# Patient Record
Sex: Female | Born: 2000 | Race: Black or African American | Hispanic: No | Marital: Single | Smoking: Never smoker
Health system: Southern US, Community
[De-identification: ages and names within clinical notes are randomized; demographics above are authoritative.]

## PROBLEM LIST (undated history)

## (undated) ENCOUNTER — Inpatient Hospital Stay (HOSPITAL_COMMUNITY): Payer: Self-pay

## (undated) DIAGNOSIS — M069 Rheumatoid arthritis, unspecified: Secondary | ICD-10-CM

## (undated) DIAGNOSIS — S39012A Strain of muscle, fascia and tendon of lower back, initial encounter: Secondary | ICD-10-CM

## (undated) HISTORY — PX: NO PAST SURGERIES: SHX2092

---
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
## 2020-01-04 DIAGNOSIS — R768 Other specified abnormal immunological findings in serum: Secondary | ICD-10-CM | POA: Diagnosis not present

## 2020-01-04 DIAGNOSIS — Z8739 Personal history of other diseases of the musculoskeletal system and connective tissue: Secondary | ICD-10-CM | POA: Diagnosis not present

## 2020-01-04 DIAGNOSIS — M059 Rheumatoid arthritis with rheumatoid factor, unspecified: Secondary | ICD-10-CM | POA: Diagnosis not present

## 2020-01-04 DIAGNOSIS — Z79899 Other long term (current) drug therapy: Secondary | ICD-10-CM | POA: Diagnosis not present

## 2020-02-07 ENCOUNTER — Encounter (HOSPITAL_COMMUNITY): Payer: Self-pay

## 2020-02-07 ENCOUNTER — Emergency Department (HOSPITAL_COMMUNITY)
Admission: EM | Admit: 2020-02-07 | Discharge: 2020-02-07 | Disposition: A | Discharge status: Home / Self Care | Payer: BC Managed Care – PPO | Attending: Emergency Medicine | Admitting: Emergency Medicine

## 2020-02-07 ENCOUNTER — Other Ambulatory Visit: Payer: Self-pay

## 2020-02-07 DIAGNOSIS — M549 Dorsalgia, unspecified: Secondary | ICD-10-CM | POA: Insufficient documentation

## 2020-02-07 DIAGNOSIS — N939 Abnormal uterine and vaginal bleeding, unspecified: Secondary | ICD-10-CM | POA: Insufficient documentation

## 2020-02-07 DIAGNOSIS — N399 Disorder of urinary system, unspecified: Secondary | ICD-10-CM | POA: Diagnosis not present

## 2020-02-07 DIAGNOSIS — N938 Other specified abnormal uterine and vaginal bleeding: Secondary | ICD-10-CM

## 2020-02-07 HISTORY — DX: Rheumatoid arthritis, unspecified: M06.9

## 2020-02-07 LAB — URINALYSIS, ROUTINE W REFLEX MICROSCOPIC
Bilirubin Urine: NEGATIVE
Glucose, UA: NEGATIVE mg/dL
Ketones, ur: NEGATIVE mg/dL
Leukocytes,Ua: NEGATIVE
Nitrite: NEGATIVE
Protein, ur: NEGATIVE mg/dL
Specific Gravity, Urine: 1.024 (ref 1.005–1.030)
pH: 5 (ref 5.0–8.0)

## 2020-02-07 LAB — CBC
HCT: 35.6 % — ABNORMAL LOW (ref 36.0–46.0)
Hemoglobin: 11.2 g/dL — ABNORMAL LOW (ref 12.0–15.0)
MCH: 27.4 pg (ref 26.0–34.0)
MCHC: 31.5 g/dL (ref 30.0–36.0)
MCV: 87 fL (ref 80.0–100.0)
Platelets: 334 K/uL (ref 150–400)
RBC: 4.09 MIL/uL (ref 3.87–5.11)
RDW: 14.2 % (ref 11.5–15.5)
WBC: 4.6 K/uL (ref 4.0–10.5)
nRBC: 0 % (ref 0.0–0.2)

## 2020-02-07 LAB — PREGNANCY, URINE: Preg Test, Ur: NEGATIVE

## 2020-02-07 NOTE — ED Provider Notes (Signed)
[facility] EMERGENCY DEPARTMENT Provider Note   CSN: [id number] Arrival date & time: 02/07/20  2725     History Chief Complaint  Patient presents with  . Vaginal Bleeding    Caitlyn Green is a 19 y.o. female with a history of rheumatoid arthritis only, presenting for evaluation of vaginal bleeding.  Her LMP was 01/22/2020 and normal, stating she typically has normal cycles.  She started bleeding heavy 4 days ago and reports low back cramping discomfort as well.  She has had unprotected sex and was concerned about possible miscarriage, although she has taken 2 home pregnancy tests which were negative.  She denies significant abdominal cramping, no nausea or vomiting, no weakness.  She states she is having to change her pad twice per hour during the day, but does not have to change overnight.  She denies vaginal discharge, dysuria, dizziness.  HPI     Past Medical History:  Diagnosis Date  . Back strain   . Rheumatoid arthritis (HCC)     There are no problems to display for this patient.   History reviewed. No pertinent surgical history.   OB History    Gravida  0   Para  0   Term  0   Preterm  0   AB  0   Living  0     SAB  0   TAB  0   Ectopic  0   Multiple  0   Live Births  0           Family History  Problem Relation Age of Onset  . Healthy Mother   . Cancer Father     Social History   Tobacco Use  . Smoking status: Never Smoker  . Smokeless tobacco: Never Used  Vaping Use  . Vaping Use: Never used  Substance Use Topics  . Alcohol use: No  . Drug use: No    Home Medications Prior to Admission medications   Not on File    Allergies    Patient has no known allergies.  Review of Systems   Review of Systems  Constitutional: Negative for fever.  HENT: Negative.   Eyes: Negative.   Respiratory: Negative for chest tightness and shortness of breath.   Cardiovascular: Negative for chest pain.  Gastrointestinal: Negative for  abdominal pain, nausea and vomiting.  Genitourinary: Positive for vaginal bleeding. Negative for vaginal discharge.  Musculoskeletal: Positive for back pain. Negative for arthralgias, joint swelling and neck pain.  Skin: Negative.  Negative for rash and wound.  Neurological: Negative for dizziness, weakness, light-headedness, numbness and headaches.  Psychiatric/Behavioral: Negative.     Physical Exam Updated Vital Signs BP (!) 102/58   Pulse 77   Temp 98.2 F (36.8 C) (Oral)   Resp 18   Ht 5\' 11"  (1.803 m)   Wt 62.1 kg   LMP 01/22/2020   SpO2 100%   BMI 19.11 kg/m   Physical Exam Vitals and nursing note reviewed.  Constitutional:      Appearance: She is well-developed.  HENT:     Head: Normocephalic and atraumatic.  Eyes:     Conjunctiva/sclera: Conjunctivae normal.  Cardiovascular:     Rate and Rhythm: Normal rate and regular rhythm.     Heart sounds: Normal heart sounds.  Pulmonary:     Effort: Pulmonary effort is normal.     Breath sounds: Normal breath sounds. No wheezing.  Abdominal:     General: Bowel sounds are normal.  Palpations: Abdomen is soft. There is no mass.     Tenderness: There is no abdominal tenderness. There is no guarding.  Genitourinary:    Comments: deferred Musculoskeletal:        General: Normal range of motion.     Cervical back: Normal range of motion.  Skin:    General: Skin is warm and dry.  Neurological:     General: No focal deficit present.     Mental Status: She is alert.     ED Results / Procedures / Treatments   Labs (all labs ordered are listed, but only abnormal results are displayed) Labs Reviewed  URINALYSIS, ROUTINE W REFLEX MICROSCOPIC - Abnormal; Notable for the following components:      Result Value   Color, Urine AMBER (*)    APPearance HAZY (*)    Hgb urine dipstick MODERATE (*)    Bacteria, UA RARE (*)    All other components within normal limits  CBC - Abnormal; Notable for the following components:    Hemoglobin 11.2 (*)    HCT 35.6 (*)    All other components within normal limits  PREGNANCY, URINE    EKG None  Radiology No results found.  Procedures Procedures (including critical care time)  Medications Ordered in ED Medications - No data to display  ED Course  I have reviewed the triage vital signs and the nursing notes.  Pertinent labs & imaging results that were available during my care of the patient were reviewed by me and considered in my medical decision making (see chart for details).    MDM Rules/Calculators/A&P                          Patient with new onset abnormal vaginal bleeding, stable vital signs and blood work today, she is in no distress, denies weakness, lightheadedness.She is borderline anemic, no prior recent hemoglobins to compare this to.  She was given strict instructions for follow-up care, return precautions for lightheadedness, weakness or bleeding that persist beyond the next 2 to 3 days., also referred to gynecology as she expressed an interest in establishing care with a dedicated gynecologist.  Final Clinical Impression(s) / ED Diagnoses Final diagnoses:  Dysfunctional uterine bleeding    Rx / DC Orders ED Discharge Orders    None       Victoriano Lain 02/07/20 1321    Mancel Bale, MD 02/08/20 [id number]

## 2020-02-07 NOTE — Discharge Instructions (Signed)
Your lab tests and exam are reassuring today.  However I do recommend a follow-up with your primary doctor for recheck if your bleeding persists beyond the next 2 to 3 days or if you develop dizziness or weakness.  Make sure you are drinking plenty of fluids.  You may also want to consider seeing a gynecologist, especially if you have continued problems with irregular and heavy periods.  I have given your referral to [facility] here in [place] if you choose that route.

## 2020-02-07 NOTE — ED Triage Notes (Signed)
Pt reports she thinks she is having a miscarriage. Pt does not have a Ob.. reports vaginal bleeding since Friday. Changing pads 2 x per hour. No cramps now. Pt has had 2 negative test

## 2020-03-13 ENCOUNTER — Encounter: Payer: Self-pay | Admitting: Adult Health

## 2020-03-13 ENCOUNTER — Ambulatory Visit (INDEPENDENT_AMBULATORY_CARE_PROVIDER_SITE_OTHER): Payer: BC Managed Care – PPO | Admitting: Adult Health

## 2020-03-13 ENCOUNTER — Other Ambulatory Visit: Payer: Self-pay

## 2020-03-13 VITALS — BP 113/77 | HR 97 | Ht 71.0 in | Wt 131.2 lb

## 2020-03-13 DIAGNOSIS — O3680X0 Pregnancy with inconclusive fetal viability, not applicable or unspecified: Secondary | ICD-10-CM | POA: Diagnosis not present

## 2020-03-13 DIAGNOSIS — N926 Irregular menstruation, unspecified: Secondary | ICD-10-CM | POA: Diagnosis not present

## 2020-03-13 DIAGNOSIS — Z3201 Encounter for pregnancy test, result positive: Secondary | ICD-10-CM | POA: Insufficient documentation

## 2020-03-13 DIAGNOSIS — Z3A01 Less than 8 weeks gestation of pregnancy: Secondary | ICD-10-CM | POA: Insufficient documentation

## 2020-03-13 LAB — POCT URINE PREGNANCY: Preg Test, Ur: POSITIVE — AB

## 2020-03-13 MED ORDER — PRENATAL PLUS IRON 29-1 MG PO TABS: ORAL_TABLET | ORAL | 12 refills | Status: DC

## 2020-03-13 NOTE — Patient Instructions (Signed)
First Trimester of Pregnancy The first trimester of pregnancy is from week 1 until the end of week 13 (months 1 through 3). A week after a sperm fertilizes an egg, the egg will implant on the wall of the uterus. This embryo will begin to develop into a baby. Genes from you and your partner will form the baby. The female genes will determine whether the baby will be a boy or a girl. At 6-8 weeks, the eyes and face will be formed, and the heartbeat can be seen on ultrasound. At the end of 12 weeks, all the baby's organs will be formed. Now that you are pregnant, you will want to do everything you can to have a healthy baby. Two of the most important things are to get good prenatal care and to follow your health care provider's instructions. Prenatal care is all the medical care you receive before the baby's birth. This care will help prevent, find, and treat any problems during the pregnancy and childbirth. Body changes during your first trimester Your body goes through many changes during pregnancy. The changes vary from woman to woman.  You may gain or lose a couple of pounds at first.  You may feel sick to your stomach (nauseous) and you may throw up (vomit). If the vomiting is uncontrollable, call your health care provider.  You may tire easily.  You may develop headaches that can be relieved by medicines. All medicines should be approved by your health care provider.  You may urinate more often. Painful urination may mean you have a bladder infection.  You may develop heartburn as a result of your pregnancy.  You may develop constipation because certain hormones are causing the muscles that push stool through your intestines to slow down.  You may develop hemorrhoids or swollen veins (varicose veins).  Your breasts may begin to grow larger and become tender. Your nipples may stick out more, and the tissue that surrounds them (areola) may become darker.  Your gums may bleed and may be  sensitive to brushing and flossing.  Dark spots or blotches (chloasma, mask of pregnancy) may develop on your face. This will likely fade after the baby is born.  Your menstrual periods will stop.  You may have a loss of appetite.  You may develop cravings for certain kinds of food.  You may have changes in your emotions from day to day, such as being excited to be pregnant or being concerned that something may go wrong with the pregnancy and baby.  You may have more vivid and strange dreams.  You may have changes in your hair. These can include thickening of your hair, rapid growth, and changes in texture. Some women also have hair loss during or after pregnancy, or hair that feels dry or thin. Your hair will most likely return to normal after your baby is born. What to expect at prenatal visits During a routine prenatal visit:  You will be weighed to make sure you and the baby are growing normally.  Your blood pressure will be taken.  Your abdomen will be measured to track your baby's growth.  The fetal heartbeat will be listened to between weeks 10 and 14 of your pregnancy.  Test results from any previous visits will be discussed. Your health care provider may ask you:  How you are feeling.  If you are feeling the baby move.  If you have had any abnormal symptoms, such as leaking fluid, bleeding, severe headaches, or abdominal   cramping.  If you are using any tobacco products, including cigarettes, chewing tobacco, and electronic cigarettes.  If you have any questions. Other tests that may be performed during your first trimester include:  Blood tests to find your blood type and to check for the presence of any previous infections. The tests will also be used to check for low iron levels (anemia) and protein on red blood cells (Rh antibodies). Depending on your risk factors, or if you previously had diabetes during pregnancy, you may have tests to check for high blood sugar  that affects pregnant women (gestational diabetes).  Urine tests to check for infections, diabetes, or protein in the urine.  An ultrasound to confirm the proper growth and development of the baby.  Fetal screens for spinal cord problems (spina bifida) and Down syndrome.  HIV (human immunodeficiency virus) testing. Routine prenatal testing includes screening for HIV, unless you choose not to have this test.  You may need other tests to make sure you and the baby are doing well. Follow these instructions at home: Medicines  Follow your health care provider's instructions regarding medicine use. Specific medicines may be either safe or unsafe to take during pregnancy.  Take a prenatal vitamin that contains at least 600 micrograms (mcg) of folic acid.  If you develop constipation, try taking a stool softener if your health care provider approves. Eating and drinking   Eat a balanced diet that includes fresh fruits and vegetables, whole grains, good sources of protein such as meat, eggs, or tofu, and low-fat dairy. Your health care provider will help you determine the amount of weight gain that is right for you.  Avoid raw meat and uncooked cheese. These carry germs that can cause birth defects in the baby.  Eating four or five small meals rather than three large meals a day may help relieve nausea and vomiting. If you start to feel nauseous, eating a few soda crackers can be helpful. Drinking liquids between meals, instead of during meals, also seems to help ease nausea and vomiting.  Limit foods that are high in fat and processed sugars, such as fried and sweet foods.  To prevent constipation: ? Eat foods that are high in fiber, such as fresh fruits and vegetables, whole grains, and beans. ? Drink enough fluid to keep your urine clear or pale yellow. Activity  Exercise only as directed by your health care provider. Most women can continue their usual exercise routine during  pregnancy. Try to exercise for 30 minutes at least 5 days a week. Exercising will help you: ? Control your weight. ? Stay in shape. ? Be prepared for labor and delivery.  Experiencing pain or cramping in the lower abdomen or lower back is a good sign that you should stop exercising. Check with your health care provider before continuing with normal exercises.  Try to avoid standing for long periods of time. Move your legs often if you must stand in one place for a long time.  Avoid heavy lifting.  Wear low-heeled shoes and practice good posture.  You may continue to have sex unless your health care provider tells you not to. Relieving pain and discomfort  Wear a good support bra to relieve breast tenderness.  Take warm sitz baths to soothe any pain or discomfort caused by hemorrhoids. Use hemorrhoid cream if your health care provider approves.  Rest with your legs elevated if you have leg cramps or low back pain.  If you develop varicose veins in   your legs, wear support hose. Elevate your feet for 15 minutes, 3-4 times a day. Limit salt in your diet. Prenatal care  Schedule your prenatal visits by the twelfth week of pregnancy. They are usually scheduled monthly at first, then more often in the last 2 months before delivery.  Write down your questions. Take them to your prenatal visits.  Keep all your prenatal visits as told by your health care provider. This is important. Safety  Wear your seat belt at all times when driving.  Make a list of emergency phone numbers, including numbers for family, friends, the hospital, and police and fire departments. General instructions  Ask your health care provider for a referral to a local prenatal education class. Begin classes no later than the beginning of month 6 of your pregnancy.  Ask for help if you have counseling or nutritional needs during pregnancy. Your health care provider can offer advice or refer you to specialists for help  with various needs.  Do not use hot tubs, steam rooms, or saunas.  Do not douche or use tampons or scented sanitary pads.  Do not cross your legs for long periods of time.  Avoid cat litter boxes and soil used by cats. These carry germs that can cause birth defects in the baby and possibly loss of the fetus by miscarriage or stillbirth.  Avoid all smoking, herbs, alcohol, and medicines not prescribed by your health care provider. Chemicals in these products affect the formation and growth of the baby.  Do not use any products that contain nicotine or tobacco, such as cigarettes and e-cigarettes. If you need help quitting, ask your health care provider. You may receive counseling support and other resources to help you quit.  Schedule a dentist appointment. At home, brush your teeth with a soft toothbrush and be gentle when you floss. Contact a health care provider if:  You have dizziness.  You have mild pelvic cramps, pelvic pressure, or nagging pain in the abdominal area.  You have persistent nausea, vomiting, or diarrhea.  You have a bad smelling vaginal discharge.  You have pain when you urinate.  You notice increased swelling in your face, hands, legs, or ankles.  You are exposed to fifth disease or chickenpox.  You are exposed to German measles (rubella) and have never had it. Get help right away if:  You have a fever.  You are leaking fluid from your vagina.  You have spotting or bleeding from your vagina.  You have severe abdominal cramping or pain.  You have rapid weight gain or loss.  You vomit blood or material that looks like coffee grounds.  You develop a severe headache.  You have shortness of breath.  You have any kind of trauma, such as from a fall or a car accident. Summary  The first trimester of pregnancy is from week 1 until the end of week 13 (months 1 through 3).  Your body goes through many changes during pregnancy. The changes vary from  woman to woman.  You will have routine prenatal visits. During those visits, your health care provider will examine you, discuss any test results you may have, and talk with you about how you are feeling. This information is not intended to replace advice given to you by your health care provider. Make sure you discuss any questions you have with your health care provider. Document Revised: 06/05/2017 Document Reviewed: 06/04/2016 [employer]vier Patient Education  2020 [employer]  

## 2020-03-13 NOTE — Progress Notes (Signed)
°  Subjective:     Patient ID: Caitlyn Green, female   DOB: [birth year], 19 y.o.   MRN: [id number]  HPI Caitlyn Green is a 19 year old black female,single, in for UPT, has missed a period and had 1+HPT.  Review of Systems +missed period with 1+HPT +nausea +back pain at times No cramping or bleeding Reviewed past medical,surgical, social and family history. Reviewed medications and allergies.     Objective:   Physical Exam BP 113/77 (BP Location: Right Arm, Patient Position: Sitting, Cuff Size: Normal)    Pulse 97    Ht 5\' 11"  (1.803 m)    Wt 131 lb 3.2 oz (59.5 kg)    LMP 01/23/2020    BMI 18.30 kg/m UPT is +, about 7+1 week by LMP with EDD 10/29/20,Skin warm and dry. Neck: mid line trachea, normal thyroid, good ROM, no lymphadenopathy noted. Lungs: clear to ausculation bilaterally. Cardiovascular: regular rate and rhythm. Abdomen is soft and non tender AA is 0 PHQ 9 score is 4, no SI.   Upstream - 03/13/20 1451      Pregnancy Intention Screening   Does the patient want to become pregnant in the next year? Yes    Does the patient's partner want to become pregnant in the next year? Yes    Would the patient like to discuss contraceptive options today? No      Contraception Wrap Up   Current Method Pregnant/Seeking Pregnancy    End Method Pregnant/Seeking Pregnancy    Contraception Counseling Provided No             Assessment:     1. Pregnancy test positive Rx PNV Meds ordered this encounter  Medications   Prenatal Vit-Iron Carbonyl-FA (PRENATAL PLUS IRON) 29-1 MG TABS    Sig: Take 1 daily    Dispense:  30 tablet    Refill:  12    Order Specific Question:   Supervising Provider    Answer:   05/13/20 H [[id number]]    2. Late period   3. Less than [redacted] weeks gestation of pregnancy Eat small frequent meals Try gum and hard candy for nausea for now  4. Encounter to determine fetal viability of pregnancy, single or unspecified fetus Return in about 3 weeks for dating [facility]      Plan:     Review handouts on First trimester and by [facility]

## 2020-04-10 ENCOUNTER — Ambulatory Visit (INDEPENDENT_AMBULATORY_CARE_PROVIDER_SITE_OTHER): Payer: BC Managed Care – PPO

## 2020-04-10 DIAGNOSIS — Z3A11 11 weeks gestation of pregnancy: Secondary | ICD-10-CM

## 2020-04-10 DIAGNOSIS — O3680X0 Pregnancy with inconclusive fetal viability, not applicable or unspecified: Secondary | ICD-10-CM

## 2020-04-10 NOTE — Progress Notes (Signed)
[place] 9+6 wks,single IUP,FHR 171 bpm,CRL 30.93 mm,normal ovaries

## 2020-04-23 ENCOUNTER — Telehealth: Payer: Self-pay | Admitting: *Deleted

## 2020-04-23 ENCOUNTER — Other Ambulatory Visit: Payer: Self-pay | Admitting: Advanced Practice Midwife

## 2020-04-23 MED ORDER — PROMETHAZINE HCL 25 MG PO TABS: 25.0000 mg | ORAL_TABLET | Freq: Four times a day (QID) | ORAL | 1 refills | Status: DC | PRN

## 2020-04-23 NOTE — Progress Notes (Signed)
Phenergan for nausea

## 2020-04-23 NOTE — Telephone Encounter (Signed)
Phenergan sent to pharmacy

## 2020-04-23 NOTE — Telephone Encounter (Signed)
Pt having a lot of nausea. Would like rx to be sent in. Currently [redacted]w[redacted]d.

## 2020-04-24 ENCOUNTER — Ambulatory Visit: Payer: BC Managed Care – PPO | Admitting: *Deleted

## 2020-04-26 ENCOUNTER — Encounter: Payer: Self-pay | Admitting: Women's Health

## 2020-04-26 DIAGNOSIS — Z3400 Encounter for supervision of normal first pregnancy, unspecified trimester: Secondary | ICD-10-CM | POA: Insufficient documentation

## 2020-04-27 ENCOUNTER — Other Ambulatory Visit: Payer: Self-pay | Admitting: Obstetrics & Gynecology

## 2020-04-27 DIAGNOSIS — Z3682 Encounter for antenatal screening for nuchal translucency: Secondary | ICD-10-CM

## 2020-04-30 ENCOUNTER — Other Ambulatory Visit: Payer: Self-pay

## 2020-04-30 ENCOUNTER — Ambulatory Visit (INDEPENDENT_AMBULATORY_CARE_PROVIDER_SITE_OTHER): Payer: BC Managed Care – PPO | Admitting: Women's Health

## 2020-04-30 ENCOUNTER — Ambulatory Visit (INDEPENDENT_AMBULATORY_CARE_PROVIDER_SITE_OTHER): Payer: BC Managed Care – PPO

## 2020-04-30 ENCOUNTER — Encounter: Payer: Self-pay | Admitting: Women's Health

## 2020-04-30 ENCOUNTER — Ambulatory Visit: Payer: BC Managed Care – PPO | Admitting: *Deleted

## 2020-04-30 VITALS — BP 100/69 | HR 87 | Wt 135.0 lb

## 2020-04-30 DIAGNOSIS — Z3A12 12 weeks gestation of pregnancy: Secondary | ICD-10-CM | POA: Diagnosis not present

## 2020-04-30 DIAGNOSIS — Z3402 Encounter for supervision of normal first pregnancy, second trimester: Secondary | ICD-10-CM

## 2020-04-30 DIAGNOSIS — Z3401 Encounter for supervision of normal first pregnancy, first trimester: Secondary | ICD-10-CM | POA: Diagnosis not present

## 2020-04-30 DIAGNOSIS — Z3682 Encounter for antenatal screening for nuchal translucency: Secondary | ICD-10-CM

## 2020-04-30 DIAGNOSIS — Z1389 Encounter for screening for other disorder: Secondary | ICD-10-CM

## 2020-04-30 DIAGNOSIS — Z31430 Encounter of female for testing for genetic disease carrier status for procreative management: Secondary | ICD-10-CM | POA: Diagnosis not present

## 2020-04-30 DIAGNOSIS — M0880 Other juvenile arthritis, unspecified site: Secondary | ICD-10-CM

## 2020-04-30 DIAGNOSIS — Z331 Pregnant state, incidental: Secondary | ICD-10-CM

## 2020-04-30 LAB — POCT URINALYSIS DIPSTICK OB
Blood, UA: NEGATIVE
Glucose, UA: NEGATIVE
Ketones, UA: NEGATIVE
Leukocytes, UA: NEGATIVE
Nitrite, UA: NEGATIVE
POC,PROTEIN,UA: NEGATIVE

## 2020-04-30 MED ORDER — ASPIRIN 81 MG PO TBEC: 81.0000 mg | DELAYED_RELEASE_TABLET | Freq: Every day | ORAL | 8 refills | Status: DC

## 2020-04-30 NOTE — Patient Instructions (Addendum)
Caitlyn Green, I greatly value your feedback.  If you receive a survey following your visit with [place] today, we appreciate you taking the time to fill it out.  Thanks, Caitlyn Green, CNM, WHNP-BC   [facility] ([address] [place], [place] [address]) Entrance C, located off of [address] valet parking   Begin taking a 81mg  low-dose/baby aspirin daily to decrease the risk of preeclampsia during pregnancy. This prescription has been sent to your pharmacy, or you can pick it up over the counter.     Nausea & Vomiting  Have saltine crackers or pretzels by your bed and eat a few bites before you raise your head out of bed in the morning  Eat small frequent meals throughout the day instead of large meals  Drink plenty of fluids throughout the day to stay hydrated, just don't drink a lot of fluids with your meals.  This can make your stomach fill up faster making you feel sick  Do not brush your teeth right after you eat  Products with real ginger are good for nausea, like ginger ale and ginger hard candy Make sure it says made with real ginger!  Sucking on sour candy like lemon heads is also good for nausea  If your prenatal vitamins make you nauseated, take them at night so you will sleep through the nausea  Sea Bands  If you feel like you need medicine for the nausea & vomiting please let us know  If you are unable to keep any fluids or food down please let us know   Constipation  Drink plenty of fluid, preferably water, throughout the day  Eat foods high in fiber such as fruits, vegetables, and grains  Exercise, such as walking, is a good way to keep your bowels regular  Drink warm fluids, especially warm prune juice, or decaf coffee  Eat a 1/2 cup of real oatmeal (not instant), 1/2 cup applesauce, and 1/2-1 cup warm prune juice every day  If needed, you may take Colace (docusate sodium) stool softener once or twice a day to help keep  the stool soft.   If you still are having problems with constipation, you may take Miralax once daily as needed to help keep your bowels regular.   Home Blood Pressure Monitoring for Patients   Your provider has recommended that you check your blood pressure (BP) at least once a week at home. If you do not have a blood pressure cuff at home, one will be provided for you. Contact your provider if you have not received your monitor within 1 week.   Helpful Tips for Accurate Home Blood Pressure Checks  . Don't smoke, exercise, or drink caffeine 30 minutes before checking your BP . Use the restroom before checking your BP (a full bladder can raise your pressure) . Relax in a comfortable upright chair . Feet on the ground . Left arm resting comfortably on a flat surface at the level of your heart . Legs uncrossed . Back supported . Sit quietly and don't talk . Place the cuff on your bare arm . Adjust snuggly, so that only two fingertips can fit between your skin and the top of the cuff . Check 2 readings separated by at least one minute . Keep a log of your BP readings . For a visual, please reference this diagram: http://ccnc.care/bpdiagram  Provider Name: [facility]     Phone: [phone]  Zone 1:  ALL CLEAR  Continue to monitor your symptoms:  . BP reading is less than 140 (top number) or less than 90 (bottom number)  . No right upper stomach pain . No headaches or seeing spots . No feeling nauseated or throwing up . No swelling in face and hands  Zone 2: CAUTION Call your doctor's office for any of the following:  . BP reading is greater than 140 (top number) or greater than 90 (bottom number)  . Stomach pain under your ribs in the middle or right side . Headaches or seeing spots . Feeling nauseated or throwing up . Swelling in face and hands  Zone 3: EMERGENCY  Seek immediate medical care if you have any of the following:  . BP reading is greater than160 (top  number) or greater than 110 (bottom number) . Severe headaches not improving with Tylenol . Serious difficulty catching your breath . Any worsening symptoms from Zone 2    First Trimester of Pregnancy The first trimester of pregnancy is from week 1 until the end of week 12 (months 1 through 3). A week after a sperm fertilizes an egg, the egg will implant on the wall of the uterus. This embryo will begin to develop into a baby. Genes from you and your partner are forming the baby. The female genes determine whether the baby is a boy or a girl. At 6-8 weeks, the eyes and face are formed, and the heartbeat can be seen on ultrasound. At the end of 12 weeks, all the baby's organs are formed.  Now that you are pregnant, you will want to do everything you can to have a healthy baby. Two of the most important things are to get good prenatal care and to follow your health care provider's instructions. Prenatal care is all the medical care you receive before the baby's birth. This care will help prevent, find, and treat any problems during the pregnancy and childbirth. BODY CHANGES Your body goes through many changes during pregnancy. The changes vary from woman to woman.   You may gain or lose a couple of pounds at first.  You may feel sick to your stomach (nauseous) and throw up (vomit). If the vomiting is uncontrollable, call your health care provider.  You may tire easily.  You may develop headaches that can be relieved by medicines approved by your health care provider.  You may urinate more often. Painful urination may mean you have a bladder infection.  You may develop heartburn as a result of your pregnancy.  You may develop constipation because certain hormones are causing the muscles that push waste through your intestines to slow down.  You may develop hemorrhoids or swollen, bulging veins (varicose veins).  Your breasts may begin to grow larger and become tender. Your nipples may stick out  more, and the tissue that surrounds them (areola) may become darker.  Your gums may bleed and may be sensitive to brushing and flossing.  Dark spots or blotches (chloasma, mask of pregnancy) may develop on your face. This will likely fade after the baby is born.  Your menstrual periods will stop.  You may have a loss of appetite.  You may develop cravings for certain kinds of food.  You may have changes in your emotions from day to day, such as being excited to be pregnant or being concerned that something may go wrong with the pregnancy and baby.  You may have more vivid and strange dreams.  You may have  changes in your hair. These can include thickening of your hair, rapid growth, and changes in texture. Some women also have hair loss during or after pregnancy, or hair that feels dry or thin. Your hair will most likely return to normal after your baby is born. WHAT TO EXPECT AT YOUR PRENATAL VISITS During a routine prenatal visit:  You will be weighed to make sure you and the baby are growing normally.  Your blood pressure will be taken.  Your abdomen will be measured to track your baby's growth.  The fetal heartbeat will be listened to starting around week 10 or 12 of your pregnancy.  Test results from any previous visits will be discussed. Your health care provider may ask you:  How you are feeling.  If you are feeling the baby move.  If you have had any abnormal symptoms, such as leaking fluid, bleeding, severe headaches, or abdominal cramping.  If you have any questions. Other tests that may be performed during your first trimester include:  Blood tests to find your blood type and to check for the presence of any previous infections. They will also be used to check for low iron levels (anemia) and Rh antibodies. Later in the pregnancy, blood tests for diabetes will be done along with other tests if problems develop.  Urine tests to check for infections, diabetes, or  protein in the urine.  An ultrasound to confirm the proper growth and development of the baby.  An amniocentesis to check for possible genetic problems.  Fetal screens for spina bifida and Down syndrome.  You may need other tests to make sure you and the baby are doing well. HOME CARE INSTRUCTIONS  Medicines  Follow your health care provider's instructions regarding medicine use. Specific medicines may be either safe or unsafe to take during pregnancy.  Take your prenatal vitamins as directed.  If you develop constipation, try taking a stool softener if your health care provider approves. Diet  Eat regular, well-balanced meals. Choose a variety of foods, such as meat or vegetable-based protein, fish, milk and low-fat dairy products, vegetables, fruits, and whole grain breads and cereals. Your health care provider will help you determine the amount of weight gain that is right for you.  Avoid raw meat and uncooked cheese. These carry germs that can cause birth defects in the baby.  Eating four or five small meals rather than three large meals a day may help relieve nausea and vomiting. If you start to feel nauseous, eating a few soda crackers can be helpful. Drinking liquids between meals instead of during meals also seems to help nausea and vomiting.  If you develop constipation, eat more high-fiber foods, such as fresh vegetables or fruit and whole grains. Drink enough fluids to keep your urine clear or pale yellow. Activity and Exercise  Exercise only as directed by your health care provider. Exercising will help you:  Control your weight.  Stay in shape.  Be prepared for labor and delivery.  Experiencing pain or cramping in the lower abdomen or low back is a good sign that you should stop exercising. Check with your health care provider before continuing normal exercises.  Try to avoid standing for long periods of time. Move your legs often if you must stand in one place for  a long time.  Avoid heavy lifting.  Wear low-heeled shoes, and practice good posture.  You may continue to have sex unless your health care provider directs you otherwise. Relief of Pain  or Discomfort  Wear a good support bra for breast tenderness.    Take warm sitz baths to soothe any pain or discomfort caused by hemorrhoids. Use hemorrhoid cream if your health care provider approves.    Rest with your legs elevated if you have leg cramps or low back pain.  If you develop varicose veins in your legs, wear support hose. Elevate your feet for 15 minutes, 3-4 times a day. Limit salt in your diet. Prenatal Care  Schedule your prenatal visits by the twelfth week of pregnancy. They are usually scheduled monthly at first, then more often in the last 2 months before delivery.  Write down your questions. Take them to your prenatal visits.  Keep all your prenatal visits as directed by your health care provider. Safety  Wear your seat belt at all times when driving.  Make a list of emergency phone numbers, including numbers for family, friends, the hospital, and police and fire departments. General Tips  Ask your health care provider for a referral to a local prenatal education class. Begin classes no later than at the beginning of month 6 of your pregnancy.  Ask for help if you have counseling or nutritional needs during pregnancy. Your health care provider can offer advice or refer you to specialists for help with various needs.  Do not use hot tubs, steam rooms, or saunas.  Do not douche or use tampons or scented sanitary pads.  Do not cross your legs for long periods of time.  Avoid cat litter boxes and soil used by cats. These carry germs that can cause birth defects in the baby and possibly loss of the fetus by miscarriage or stillbirth.  Avoid all smoking, herbs, alcohol, and medicines not prescribed by your health care provider. Chemicals in these affect the formation and  growth of the baby.  Schedule a dentist appointment. At home, brush your teeth with a soft toothbrush and be gentle when you floss. SEEK MEDICAL CARE IF:   You have dizziness.  You have mild pelvic cramps, pelvic pressure, or nagging pain in the abdominal area.  You have persistent nausea, vomiting, or diarrhea.  You have a bad smelling vaginal discharge.  You have pain with urination.  You notice increased swelling in your face, hands, legs, or ankles. SEEK IMMEDIATE MEDICAL CARE IF:   You have a fever.  You are leaking fluid from your vagina.  You have spotting or bleeding from your vagina.  You have severe abdominal cramping or pain.  You have rapid weight gain or loss.  You vomit blood or material that looks like coffee grounds.  You are exposed to [place] measles and have never had them.  You are exposed to fifth disease or chickenpox.  You develop a severe headache.  You have shortness of breath.  You have any kind of trauma, such as from a fall or a car accident. Document Released: 06/17/2001 Document Revised: 11/07/2013 Document Reviewed: 05/03/2013 [facility] Patient Information 2015 [place], [place]. This information is not intended to replace advice given to you by your health care provider. Make sure you discuss any questions you have with your health care provider.

## 2020-04-30 NOTE — Progress Notes (Signed)
   NURSE VISIT- [facility]  SUBJECTIVE:  Caitlyn Green is a 19 y.o. G28P0000 female here for Panorama NIPT and Horizon Carrier Screening . She is [redacted]w[redacted]d pregnant.   OBJECTIVE:  Appears well, in no apparent distress  Blood work drawn from right [facility] without difficulty. 1 attempt(s).   ASSESSMENT: Pregnancy [redacted]w[redacted]d Panorama NIPT and Horizon Carrier Screening  PLAN: [facility] portal information given and instructed patient how to access results   Jobe Marker  04/30/2020 4:20 PM

## 2020-04-30 NOTE — Progress Notes (Signed)
INITIAL OBSTETRICAL VISIT Patient name: Caitlyn Green MRN [id number]  Date of birth: [birth year] Chief Complaint:   Initial Prenatal Visit (nt/it)  History of Present Illness:   Caitlyn Green is a 19 y.o. G21P0000 African American female at [redacted]w[redacted]d by [place] at 9 weeks with an Estimated Date of Delivery: 11/07/20 being seen today for her initial obstetrical visit.   Her obstetrical history is significant for primigravida.   Today she reports nausea, hasn't picked up phenergan yet.  Depression screen [facility] 2/9 04/30/2020 03/13/2020  Decreased Interest 1 0  Down, Depressed, Hopeless 1 1  PHQ - 2 Score 2 1  Altered sleeping 3 1  Tired, decreased energy 1 1  Change in appetite 1 0  Feeling bad or failure about yourself  0 1  Trouble concentrating 0 0  Moving slowly or fidgety/restless 0 0  Suicidal thoughts 0 0  PHQ-9 Score 7 4  Difficult doing work/chores - Not difficult at all    Patient's last menstrual period was 01/22/2020 (approximate). Last pap <21yo. Results were: n/a Review of Systems:   Pertinent items are noted in HPI Denies cramping/contractions, leakage of fluid, vaginal bleeding, abnormal vaginal discharge w/ itching/odor/irritation, headaches, visual changes, shortness of breath, chest pain, abdominal pain, severe nausea/vomiting, or problems with urination or bowel movements unless otherwise stated above.  Pertinent History Reviewed:  Reviewed past medical,surgical, social, obstetrical and family history.  Reviewed problem list, medications and allergies. OB History  Gravida Para Term Preterm AB Living  1 0 0 0 0 0  SAB TAB Ectopic Multiple Live Births  0 0 0 0 0    # Outcome Date GA Lbr Len/2nd Weight Sex Delivery Anes PTL Lv  1 Current            Physical Assessment:   Vitals:   04/30/20 1534  BP: 100/69  Pulse: 87  Weight: 135 lb (61.2 kg)  Body mass index is 18.83 kg/m.       Physical Examination:  General appearance - well appearing, and in no  distress  Mental status - alert, oriented to person, place, and time  Psych:  She has a normal mood and affect  Skin - warm and dry, normal color, no suspicious lesions noted  Chest - effort normal, all lung fields clear to auscultation bilaterally  Heart - normal rate and regular rhythm  Abdomen - soft, nontender  Extremities:  No swelling or varicosities noted  Thin prep pap is not done  Chaperone: N/A    TODAY'S NT [place] 12+5 wks,measurements c/w dates,CRL 65.73 mm,fhr 155 bpm,left lateral placenta,normal ovaries,NT 1.2 mm,NB present   Results for orders placed or performed in visit on 04/30/20 (from the past 24 hour(s))  POC Urinalysis Dipstick OB   Collection Time: 04/30/20  4:20 PM  Result Value Ref Range   Color, UA     Clarity, UA     Glucose, UA Negative Negative   Bilirubin, UA     Ketones, UA neg    Spec Grav, UA     Blood, UA neg    pH, UA     POC,PROTEIN,UA Negative Negative, Trace, Small (1+), Moderate (2+), Large (3+), 4+   Urobilinogen, UA     Nitrite, UA neg    Leukocytes, UA Negative Negative   Appearance     Odor      Assessment & Plan:  1) Low-Risk Pregnancy G1P0000 at [redacted]w[redacted]d with an Estimated Date of Delivery: 11/07/20   2) Initial OB visit  3) [place]ara/AA> start ASA 81mg  daily, rx sent  Meds:  Meds ordered this encounter  Medications  . aspirin 81 MG EC tablet    Sig: Take 1 tablet (81 mg total) by mouth daily. Swallow whole.    Dispense:  30 tablet    Refill:  8    Order Specific Question:   Supervising Provider    Answer:   H [[id number]]    Initial labs obtained Continue prenatal vitamins Reviewed n/v relief measures and warning s/s to report Reviewed recommended weight gain based on pre-gravid BMI Encouraged well-balanced diet Genetic & carrier screening discussed: requests Panorama, NT/IT and Horizon 14  Ultrasound discussed; fetal survey: requested [facility]NC completed> form faxed if has or is planning to apply for medicaid The nature  of [place] [facility] for [facility] with multiple MDs and other Advanced Practice Providers was explained to patient; also emphasized that fellows, residents, and students are part of our team. Does not have home bp cuff. 1 given from office stock. Check bp weekly, let [employer] know if >140/90.   Follow-up: Return in about 3 weeks (around 05/21/2020) for LROB, 2nd IT, CNM, in person.   Orders Placed This Encounter  Procedures  . GC/Chlamydia Probe Amp  . Urine Culture  . Integrated 1  . Genetic Screening  . CBC/D/Plt+RPR+Rh+ABO+Rub Ab...  . Pain Management Screening Profile (10S)  . POC Urinalysis Dipstick OB    05/23/2020 CNM, [facility] 04/30/2020 4:29 PM

## 2020-04-30 NOTE — Progress Notes (Signed)
[place] 12+5 wks,measurements c/w dates,CRL 65.73 mm,fhr 155 bpm,left lateral placenta,normal ovaries,NT 1.2 mm,NB present

## 2020-05-01 LAB — INTEGRATED 1

## 2020-05-01 LAB — CBC/D/PLT+RPR+RH+ABO+RUB AB...
Basophils Absolute: 0 10*3/uL (ref 0.0–0.2)
Lymphocytes Absolute: 1.7 10*3/uL (ref 0.7–3.1)
Neutrophils Absolute: 3.3 10*3/uL (ref 1.4–7.0)
RBC: 4.07 x10E6/uL (ref 3.77–5.28)

## 2020-05-02 LAB — CBC/D/PLT+RPR+RH+ABO+RUB AB...
Antibody Screen: NEGATIVE
Basos: 0 %
EOS (ABSOLUTE): 0.1 10*3/uL (ref 0.0–0.4)
Eos: 1 %
HCV Ab: <0.1 s/co ratio (ref 0.0–0.9)
HIV Screen 4th Generation wRfx: NONREACTIVE
Hematocrit: 33.9 % — ABNORMAL LOW (ref 34.0–46.6)
Hemoglobin: 11.2 g/dL (ref 11.1–15.9)
Hepatitis B Surface Ag: NEGATIVE
Immature Grans (Abs): 0 10*3/uL (ref 0.0–0.1)
Immature Granulocytes: 0 %
Lymphs: 32 %
MCH: 27.5 pg (ref 26.6–33.0)
MCHC: 33 g/dL (ref 31.5–35.7)
MCV: 83 fL (ref 79–97)
Monocytes Absolute: 0.3 10*3/uL (ref 0.1–0.9)
Monocytes: 6 %
Neutrophils: 61 %
Platelets: 311 10*3/uL (ref 150–450)
RDW: 13.7 % (ref 11.7–15.4)
RPR Ser Ql: NONREACTIVE
Rh Factor: POSITIVE
Rubella Antibodies, IGG: 2.96 index (ref >0.99)
WBC: 5.4 10*3/uL (ref 3.4–10.8)

## 2020-05-02 LAB — PMP SCREEN PROFILE (10S), URINE
Amphetamine Scrn, Ur: NEGATIVE ng/mL
BARBITURATE SCREEN URINE: NEGATIVE ng/mL
BENZODIAZEPINE SCREEN, URINE: NEGATIVE ng/mL
CANNABINOIDS UR QL SCN: NEGATIVE ng/mL
Cocaine (Metab) Scrn, Ur: NEGATIVE ng/mL
Creatinine(Crt), U: 157.6 mg/dL (ref 20.0–300.0)
Methadone Screen, Urine: NEGATIVE ng/mL
OXYCODONE+OXYMORPHONE UR QL SCN: NEGATIVE ng/mL
Opiate Scrn, Ur: NEGATIVE ng/mL
Ph of Urine: 6.6 (ref 4.5–8.9)
Phencyclidine Qn, Ur: NEGATIVE ng/mL
Propoxyphene Scrn, Ur: NEGATIVE ng/mL

## 2020-05-02 LAB — GC/CHLAMYDIA PROBE AMP
Chlamydia trachomatis, NAA: NEGATIVE
Neisseria Gonorrhoeae by PCR: NEGATIVE

## 2020-05-02 LAB — INTEGRATED 1
Crown Rump Length: 65.7 mm
Gest. Age on Collection Date: 12.7 weeks
Maternal Age at EDD: 20 yr
Nuchal Translucency (NT): 1.2 mm
Number of Fetuses: 1
PAPP-A Value: 2915.1 ng/mL
Weight: 135 [lb_av]

## 2020-05-02 LAB — URINE CULTURE

## 2020-05-02 LAB — HCV INTERPRETATION

## 2020-05-18 DIAGNOSIS — M059 Rheumatoid arthritis with rheumatoid factor, unspecified: Secondary | ICD-10-CM | POA: Diagnosis not present

## 2020-05-18 DIAGNOSIS — Z79899 Other long term (current) drug therapy: Secondary | ICD-10-CM | POA: Diagnosis not present

## 2020-05-18 DIAGNOSIS — R768 Other specified abnormal immunological findings in serum: Secondary | ICD-10-CM | POA: Diagnosis not present

## 2020-05-21 ENCOUNTER — Encounter: Payer: Self-pay | Admitting: Women's Health

## 2020-05-21 DIAGNOSIS — O285 Abnormal chromosomal and genetic finding on antenatal screening of mother: Secondary | ICD-10-CM | POA: Insufficient documentation

## 2020-05-24 ENCOUNTER — Other Ambulatory Visit: Payer: Self-pay

## 2020-05-24 ENCOUNTER — Ambulatory Visit (INDEPENDENT_AMBULATORY_CARE_PROVIDER_SITE_OTHER): Payer: BC Managed Care – PPO | Admitting: Advanced Practice Midwife

## 2020-05-24 VITALS — BP 117/70 | HR 80 | Wt 137.0 lb

## 2020-05-24 DIAGNOSIS — Z3A16 16 weeks gestation of pregnancy: Secondary | ICD-10-CM

## 2020-05-24 DIAGNOSIS — Z331 Pregnant state, incidental: Secondary | ICD-10-CM

## 2020-05-24 DIAGNOSIS — Z3402 Encounter for supervision of normal first pregnancy, second trimester: Secondary | ICD-10-CM

## 2020-05-24 DIAGNOSIS — Z1389 Encounter for screening for other disorder: Secondary | ICD-10-CM

## 2020-05-24 DIAGNOSIS — Z1379 Encounter for other screening for genetic and chromosomal anomalies: Secondary | ICD-10-CM | POA: Diagnosis not present

## 2020-05-24 DIAGNOSIS — Z363 Encounter for antenatal screening for malformations: Secondary | ICD-10-CM

## 2020-05-24 LAB — POCT URINALYSIS DIPSTICK OB
Blood, UA: NEGATIVE
Glucose, UA: NEGATIVE
Ketones, UA: NEGATIVE
Leukocytes, UA: NEGATIVE
Nitrite, UA: NEGATIVE
POC,PROTEIN,UA: NEGATIVE

## 2020-05-24 MED ORDER — ONDANSETRON 4 MG PO TBDP: 4.0000 mg | ORAL_TABLET | Freq: Four times a day (QID) | ORAL | 2 refills | Status: DC | PRN

## 2020-05-24 NOTE — Progress Notes (Signed)
   LOW-RISK PREGNANCY VISIT Patient name: Caitlyn Green MRN [id number]  Date of birth: July 17, 2000 Chief Complaint:   Routine Prenatal Visit (2nd IT)  History of Present Illness:   Caitlyn Green is a 19 y.o. G1P0000 female at [redacted]w[redacted]d with an Estimated Date of Delivery: 11/07/20 being seen today for ongoing management of a low-risk pregnancy.  Today she reports vomiting Nausea has eased up, but now vomiting.. Contractions: Not present. Vag. Bleeding: None.  Movement: Present. denies leaking of fluid. Review of Systems:   Pertinent items are noted in HPI Denies abnormal vaginal discharge w/ itching/odor/irritation, headaches, visual changes, shortness of breath, chest pain, abdominal pain, severe nausea/vomiting, or problems with urination or bowel movements unless otherwise stated above. Pertinent History Reviewed:  Reviewed past medical,surgical, social, obstetrical and family history.  Reviewed problem list, medications and allergies. Physical Assessment:   Vitals:   05/24/20 1539  BP: 117/70  Pulse: 80  Weight: 137 lb (62.1 kg)  Body mass index is 19.11 kg/m.        Physical Examination:   General appearance: Well appearing, and in no distress  Mental status: Alert, oriented to person, place, and time  Skin: Warm & dry  Cardiovascular: Normal heart rate noted  Respiratory: Normal respiratory effort, no distress  Abdomen: Soft, gravid, nontender  Pelvic: Cervical exam deferred         Extremities: Edema: None  Fetal Status: Fetal Heart Rate (bpm): 150   Movement: Present    Chaperone: n/a    Results for orders placed or performed in visit on 05/24/20 (from the past 24 hour(s))  POC Urinalysis Dipstick OB   Collection Time: 05/24/20  3:41 PM  Result Value Ref Range   Color, UA     Clarity, UA     Glucose, UA Negative Negative   Bilirubin, UA     Ketones, UA neg    Spec Grav, UA     Blood, UA neg    pH, UA     POC,PROTEIN,UA Negative Negative, Trace, Small (1+), Moderate  (2+), Large (3+), 4+   Urobilinogen, UA     Nitrite, UA neg    Leukocytes, UA Negative Negative   Appearance     Odor      Assessment & Plan:  1) Low-risk pregnancy G1P0000 at [redacted]w[redacted]d with an Estimated Date of Delivery: 11/07/20     Meds:  Meds ordered this encounter  Medications  . ondansetron (ZOFRAN ODT) 4 MG disintegrating tablet    Sig: Take 1 tablet (4 mg total) by mouth every 6 (six) hours as needed for nausea.    Dispense:  30 tablet    Refill:  2    Order Specific Question:   Supervising Provider    Answer:   Lazaro Arms [[id number]]   Labs/procedures today: 2nd IT  Plan:  Continue routine obstetrical care  Next visit: prefers will be in person for anatomy scan    Reviewed: Preterm labor symptoms and general obstetric precautions including but not limited to vaginal bleeding, contractions, leaking of fluid and fetal movement were reviewed in detail with the patient.  All questions were answered.   Follow-up: Return in about 3 weeks (around 06/14/2020) for JM:EQASTMH.  Orders Placed This Encounter  Procedures  . US OB Comp + 14 Wk  . INTEGRATED 2  . POC Urinalysis Dipstick OB   Jacklyn Shell DNP, CNM 05/24/2020 4:23 PM

## 2020-05-24 NOTE — Patient Instructions (Signed)
[name], I greatly value your feedback.  If you receive a survey following your visit with Caitlyn Green today, we appreciate you taking the time to fill it out.  Thanks, Cathie Beams, CNM     [facility] HAS MOVED!!! It is now [facility] at [facility] ([address] [place], [place] [address]) Entrance located off of [address] Free 24/7 valet parking   Go to [email or web address] to register for FREE online childbirth classes    Second Trimester of Pregnancy The second trimester is from week 14 through week 27 (months 4 through 6). The second trimester is often a time when you feel your best. Your body has adjusted to being pregnant, and you begin to feel better physically. Usually, morning sickness has lessened or quit completely, you may have more energy, and you may have an increase in appetite. The second trimester is also a time when the fetus is growing rapidly. At the end of the sixth month, the fetus is about 9 inches long and weighs about 1 pounds. You will likely begin to feel the baby move (quickening) between 16 and 20 weeks of pregnancy. Body changes during your second trimester Your body continues to go through many changes during your second trimester. The changes vary from woman to woman.  Your weight will continue to increase. You will notice your lower abdomen bulging out.  You may begin to get stretch marks on your hips, abdomen, and breasts.  You may develop headaches that can be relieved by medicines. The medicines should be approved by your health care provider.  You may urinate more often because the fetus is pressing on your bladder.  You may develop or continue to have heartburn as a result of your pregnancy.  You may develop constipation because certain hormones are causing the muscles that push waste through your intestines to slow down.  You may develop hemorrhoids or swollen, bulging veins (varicose veins).  You may have  back pain. This is caused by: ? Weight gain. ? Pregnancy hormones that are relaxing the joints in your pelvis. ? A shift in weight and the muscles that support your balance.  Your breasts will continue to grow and they will continue to become tender.  Your gums may bleed and may be sensitive to brushing and flossing.  Dark spots or blotches (chloasma, mask of pregnancy) may develop on your face. This will likely fade after the baby is born.  A dark line from your belly button to the pubic area (linea nigra) may appear. This will likely fade after the baby is born.  You may have changes in your hair. These can include thickening of your hair, rapid growth, and changes in texture. Some women also have hair loss during or after pregnancy, or hair that feels dry or thin. Your hair will most likely return to normal after your baby is born.  What to expect at prenatal visits During a routine prenatal visit:  You will be weighed to make sure you and the fetus are growing normally.  Your blood pressure will be taken.  Your abdomen will be measured to track your baby's growth.  The fetal heartbeat will be listened to.  Any test results from the previous visit will be discussed.  Your health care provider may ask you:  How you are feeling.  If you are feeling the baby move.  If you have had any abnormal symptoms, such as leaking fluid, bleeding, severe headaches, or abdominal  cramping.  If you are using any tobacco products, including cigarettes, chewing tobacco, and electronic cigarettes.  If you have any questions.  Other tests that may be performed during your second trimester include:  Blood tests that check for: ? Low iron levels (anemia). ? High blood sugar that affects pregnant women (gestational diabetes) between 65 and 28 weeks. ? Rh antibodies. This is to check for a protein on red blood cells (Rh factor).  Urine tests to check for infections, diabetes, or protein in  the urine.  An ultrasound to confirm the proper growth and development of the baby.  An amniocentesis to check for possible genetic problems.  Fetal screens for spina bifida and Down syndrome.  HIV (human immunodeficiency virus) testing. Routine prenatal testing includes screening for HIV, unless you choose not to have this test.  Follow these instructions at home: Medicines  Follow your health care provider's instructions regarding medicine use. Specific medicines may be either safe or unsafe to take during pregnancy.  Take a prenatal vitamin that contains at least 600 micrograms (mcg) of folic acid.  If you develop constipation, try taking a stool softener if your health care provider approves. Eating and drinking  Eat a balanced diet that includes fresh fruits and vegetables, whole grains, good sources of protein such as meat, eggs, or tofu, and low-fat dairy. Your health care provider will help you determine the amount of weight gain that is right for you.  Avoid raw meat and uncooked cheese. These carry germs that can cause birth defects in the baby.  If you have low calcium intake from food, talk to your health care provider about whether you should take a daily calcium supplement.  Limit foods that are high in fat and processed sugars, such as fried and sweet foods.  To prevent constipation: ? Drink enough fluid to keep your urine clear or pale yellow. ? Eat foods that are high in fiber, such as fresh fruits and vegetables, whole grains, and beans. Activity  Exercise only as directed by your health care provider. Most women can continue their usual exercise routine during pregnancy. Try to exercise for 30 minutes at least 5 days a week. Stop exercising if you experience uterine contractions.  Avoid heavy lifting, wear low heel shoes, and practice good posture.  A sexual relationship may be continued unless your health care provider directs you otherwise. Relieving pain  and discomfort  Wear a good support bra to prevent discomfort from breast tenderness.  Take warm sitz baths to soothe any pain or discomfort caused by hemorrhoids. Use hemorrhoid cream if your health care provider approves.  Rest with your legs elevated if you have leg cramps or low back pain.  If you develop varicose veins, wear support hose. Elevate your feet for 15 minutes, 3-4 times a day. Limit salt in your diet. Prenatal Care  Write down your questions. Take them to your prenatal visits.  Keep all your prenatal visits as told by your health care provider. This is important. Safety  Wear your seat belt at all times when driving.  Make a list of emergency phone numbers, including numbers for family, friends, the hospital, and police and fire departments. General instructions  Ask your health care provider for a referral to a local prenatal education class. Begin classes no later than the beginning of month 6 of your pregnancy.  Ask for help if you have counseling or nutritional needs during pregnancy. Your health care provider can offer advice or refer  you to specialists for help with various needs.  Do not use hot tubs, steam rooms, or saunas.  Do not douche or use tampons or scented sanitary pads.  Do not cross your legs for long periods of time.  Avoid cat litter boxes and soil used by cats. These carry germs that can cause birth defects in the baby and possibly loss of the fetus by miscarriage or stillbirth.  Avoid all smoking, herbs, alcohol, and unprescribed drugs. Chemicals in these products can affect the formation and growth of the baby.  Do not use any products that contain nicotine or tobacco, such as cigarettes and e-cigarettes. If you need help quitting, ask your health care provider.  Visit your dentist if you have not gone yet during your pregnancy. Use a soft toothbrush to brush your teeth and be gentle when you floss. Contact a health care provider  if:  You have dizziness.  You have mild pelvic cramps, pelvic pressure, or nagging pain in the abdominal area.  You have persistent nausea, vomiting, or diarrhea.  You have a bad smelling vaginal discharge.  You have pain when you urinate. Get help right away if:  You have a fever.  You are leaking fluid from your vagina.  You have spotting or bleeding from your vagina.  You have severe abdominal cramping or pain.  You have rapid weight gain or weight loss.  You have shortness of breath with chest pain.  You notice sudden or extreme swelling of your face, hands, ankles, feet, or legs.  You have not felt your baby move in over an hour.  You have severe headaches that do not go away when you take medicine.  You have vision changes. Summary  The second trimester is from week 14 through week 27 (months 4 through 6). It is also a time when the fetus is growing rapidly.  Your body goes through many changes during pregnancy. The changes vary from woman to woman.  Avoid all smoking, herbs, alcohol, and unprescribed drugs. These chemicals affect the formation and growth your baby.  Do not use any tobacco products, such as cigarettes, chewing tobacco, and e-cigarettes. If you need help quitting, ask your health care provider.  Contact your health care provider if you have any questions. Keep all prenatal visits as told by your health care provider. This is important. This information is not intended to replace advice given to you by your health care provider. Make sure you discuss any questions you have with your health care provider.

## 2020-05-26 LAB — INTEGRATED 2
AFP MoM: 0.79
Alpha-Fetoprotein: 29.9 ng/mL
Crown Rump Length: 65.7 mm
DIA MoM: 0.86
DIA Value: 145.6 pg/mL
Estriol, Unconjugated: 0.13 ng/mL
Gest. Age on Collection Date: 12.7 weeks
Gestational Age: 16.1 weeks
Maternal Age at EDD: 20 yr
Nuchal Translucency (NT): 1.2 mm
Nuchal Translucency MoM: 0.81
Number of Fetuses: 1
PAPP-A MoM: 2.37
PAPP-A Value: 2915.1 ng/mL
Test Results:: NEGATIVE
Weight: 135 [lb_av]
Weight: 136 [lb_av]
hCG MoM: 0.41
hCG Value: 15.4 IU/mL
uE3 MoM: 0.13

## 2020-06-14 ENCOUNTER — Ambulatory Visit (INDEPENDENT_AMBULATORY_CARE_PROVIDER_SITE_OTHER): Payer: BC Managed Care – PPO | Admitting: Women's Health

## 2020-06-14 ENCOUNTER — Ambulatory Visit (INDEPENDENT_AMBULATORY_CARE_PROVIDER_SITE_OTHER): Payer: BC Managed Care – PPO

## 2020-06-14 ENCOUNTER — Encounter: Payer: Self-pay | Admitting: Women's Health

## 2020-06-14 ENCOUNTER — Other Ambulatory Visit: Payer: Self-pay

## 2020-06-14 VITALS — BP 114/73 | HR 73 | Wt 140.0 lb

## 2020-06-14 DIAGNOSIS — Z363 Encounter for antenatal screening for malformations: Secondary | ICD-10-CM | POA: Diagnosis not present

## 2020-06-14 DIAGNOSIS — Z3402 Encounter for supervision of normal first pregnancy, second trimester: Secondary | ICD-10-CM

## 2020-06-14 DIAGNOSIS — O285 Abnormal chromosomal and genetic finding on antenatal screening of mother: Secondary | ICD-10-CM

## 2020-06-14 DIAGNOSIS — Z3A19 19 weeks gestation of pregnancy: Secondary | ICD-10-CM

## 2020-06-14 DIAGNOSIS — Z1389 Encounter for screening for other disorder: Secondary | ICD-10-CM

## 2020-06-14 DIAGNOSIS — Z331 Pregnant state, incidental: Secondary | ICD-10-CM

## 2020-06-14 LAB — POCT URINALYSIS DIPSTICK OB
Blood, UA: NEGATIVE
Glucose, UA: NEGATIVE
Ketones, UA: NEGATIVE
Leukocytes, UA: NEGATIVE
Nitrite, UA: NEGATIVE
POC,PROTEIN,UA: NEGATIVE

## 2020-06-14 NOTE — Patient Instructions (Addendum)
[name], I greatly value your feedback.  If you receive a survey following your visit with [place] today, we appreciate you taking the time to fill it out.  Thanks, [name], CNM, WHNP-BC  [facility] ([address] [place], [place] [address]) Entrance C, located off of [address] valet parking  Go to [email or web address] to register for FREE online childbirth classes  [place] Pediatricians/Family Doctors:  [facility] [phone]            [facility] [phone]                 [facility] [phone] (usually not accepting new patients unless you have family there already, you are always welcome to call and ask)       [facility] Department [phone]       [facility] Pediatricians/Family Doctors:   [facility]: [phone]  [facility]: [phone]  [facility]: [phone]  [facility] Doctors:   [facility]: [phone]   [facility]: [phone]  [facility] Doctors:  [facility]: [phone]    Home Blood Pressure Monitoring for Patients   Your provider has recommended that you check your blood pressure (BP) at least once a week at home. If you do not have a blood pressure cuff at home, one will be provided for you. Contact your provider if you have not received your monitor within 1 week.   Helpful Tips for Accurate Home Blood Pressure Checks  . Don't smoke, exercise, or drink caffeine 30 minutes before checking your BP . Use the restroom before checking your BP (a full bladder can raise your pressure) . Relax in a comfortable upright chair . Feet on the ground . Left arm resting comfortably on a flat surface at the level of your heart . Legs uncrossed . Back supported . Sit quietly and don't talk . Place the cuff on your bare arm . Adjust snuggly, so  that only two fingertips can fit between your skin and the top of the cuff . Check 2 readings separated by at least one minute . Keep a log of your BP readings . For a visual, please reference this diagram: http://ccnc.care/bpdiagram  Provider Name: [facility]     Phone: [phone]  Zone 1: ALL CLEAR  Continue to monitor your symptoms:  . BP reading is less than 140 (top number) or less than 90 (bottom number)  . No right upper stomach pain . No headaches or seeing spots . No feeling nauseated or throwing up . No swelling in face and hands  Zone 2: CAUTION Call your doctor's office for any of the following:  . BP reading is greater than 140 (top number) or greater than 90 (bottom number)  . Stomach pain under your ribs in the middle or right side . Headaches or seeing spots . Feeling nauseated or throwing up . Swelling in face and hands  Zone 3: EMERGENCY  Seek immediate medical care if you have any of the following:  . BP reading is greater than160 (top number) or greater than 110 (bottom number) . Severe headaches not improving with Tylenol . Serious difficulty catching your breath . Any worsening symptoms from Zone 2     Second Trimester of Pregnancy The second trimester is from week 14 through week 27 (months 4 through 6). The second trimester is often a time when you feel your best.  Your body has adjusted to being pregnant, and you begin to feel better physically. Usually, morning sickness has lessened or quit completely, you may have more energy, and you may have an increase in appetite. The second trimester is also a time when the fetus is growing rapidly. At the end of the sixth month, the fetus is about 9 inches long and weighs about 1 pounds. You will likely begin to feel the baby move (quickening) between 16 and 20 weeks of pregnancy. Body changes during your second trimester Your body continues to go through many changes during your second trimester. The  changes vary from woman to woman.  Your weight will continue to increase. You will notice your lower abdomen bulging out.  You may begin to get stretch marks on your hips, abdomen, and breasts.  You may develop headaches that can be relieved by medicines. The medicines should be approved by your health care provider.  You may urinate more often because the fetus is pressing on your bladder.  You may develop or continue to have heartburn as a result of your pregnancy.  You may develop constipation because certain hormones are causing the muscles that push waste through your intestines to slow down.  You may develop hemorrhoids or swollen, bulging veins (varicose veins).  You may have back pain. This is caused by: ? Weight gain. ? Pregnancy hormones that are relaxing the joints in your pelvis. ? A shift in weight and the muscles that support your balance.  Your breasts will continue to grow and they will continue to become tender.  Your gums may bleed and may be sensitive to brushing and flossing.  Dark spots or blotches (chloasma, mask of pregnancy) may develop on your face. This will likely fade after the baby is born.  A dark line from your belly button to the pubic area (linea nigra) may appear. This will likely fade after the baby is born.  You may have changes in your hair. These can include thickening of your hair, rapid growth, and changes in texture. Some women also have hair loss during or after pregnancy, or hair that feels dry or thin. Your hair will most likely return to normal after your baby is born.  What to expect at prenatal visits During a routine prenatal visit:  You will be weighed to make sure you and the fetus are growing normally.  Your blood pressure will be taken.  Your abdomen will be measured to track your baby's growth.  The fetal heartbeat will be listened to.  Any test results from the previous visit will be discussed.  Your health care  provider may ask you:  How you are feeling.  If you are feeling the baby move.  If you have had any abnormal symptoms, such as leaking fluid, bleeding, severe headaches, or abdominal cramping.  If you are using any tobacco products, including cigarettes, chewing tobacco, and electronic cigarettes.  If you have any questions.  Other tests that may be performed during your second trimester include:  Blood tests that check for: ? Low iron levels (anemia). ? High blood sugar that affects pregnant women (gestational diabetes) between 64 and 28 weeks. ? Rh antibodies. This is to check for a protein on red blood cells (Rh factor).  Urine tests to check for infections, diabetes, or protein in the urine.  An ultrasound to confirm the proper growth and development of the baby.  An amniocentesis to check for possible genetic problems.  Fetal screens  for spina bifida and Down syndrome.  HIV (human immunodeficiency virus) testing. Routine prenatal testing includes screening for HIV, unless you choose not to have this test.  Follow these instructions at home: Medicines  Follow your health care provider's instructions regarding medicine use. Specific medicines may be either safe or unsafe to take during pregnancy.  Take a prenatal vitamin that contains at least 600 micrograms (mcg) of folic acid.  If you develop constipation, try taking a stool softener if your health care provider approves. Eating and drinking  Eat a balanced diet that includes fresh fruits and vegetables, whole grains, good sources of protein such as meat, eggs, or tofu, and low-fat dairy. Your health care provider will help you determine the amount of weight gain that is right for you.  Avoid raw meat and uncooked cheese. These carry germs that can cause birth defects in the baby.  If you have low calcium intake from food, talk to your health care provider about whether you should take a daily calcium  supplement.  Limit foods that are high in fat and processed sugars, such as fried and sweet foods.  To prevent constipation: ? Drink enough fluid to keep your urine clear or pale yellow. ? Eat foods that are high in fiber, such as fresh fruits and vegetables, whole grains, and beans. Activity  Exercise only as directed by your health care provider. Most women can continue their usual exercise routine during pregnancy. Try to exercise for 30 minutes at least 5 days a week. Stop exercising if you experience uterine contractions.  Avoid heavy lifting, wear low heel shoes, and practice good posture.  A sexual relationship may be continued unless your health care provider directs you otherwise. Relieving pain and discomfort  Wear a good support bra to prevent discomfort from breast tenderness.  Take warm sitz baths to soothe any pain or discomfort caused by hemorrhoids. Use hemorrhoid cream if your health care provider approves.  Rest with your legs elevated if you have leg cramps or low back pain.  If you develop varicose veins, wear support hose. Elevate your feet for 15 minutes, 3-4 times a day. Limit salt in your diet. Prenatal Care  Write down your questions. Take them to your prenatal visits.  Keep all your prenatal visits as told by your health care provider. This is important. Safety  Wear your seat belt at all times when driving.  Make a list of emergency phone numbers, including numbers for family, friends, the hospital, and police and fire departments. General instructions  Ask your health care provider for a referral to a local prenatal education class. Begin classes no later than the beginning of month 6 of your pregnancy.  Ask for help if you have counseling or nutritional needs during pregnancy. Your health care provider can offer advice or refer you to specialists for help with various needs.  Do not use hot tubs, steam rooms, or saunas.  Do not douche or use  tampons or scented sanitary pads.  Do not cross your legs for long periods of time.  Avoid cat litter boxes and soil used by cats. These carry germs that can cause birth defects in the baby and possibly loss of the fetus by miscarriage or stillbirth.  Avoid all smoking, herbs, alcohol, and unprescribed drugs. Chemicals in these products can affect the formation and growth of the baby.  Do not use any products that contain nicotine or tobacco, such as cigarettes and e-cigarettes. If you need help quitting,  ask your health care provider.  Visit your dentist if you have not gone yet during your pregnancy. Use a soft toothbrush to brush your teeth and be gentle when you floss. Contact a health care provider if:  You have dizziness.  You have mild pelvic cramps, pelvic pressure, or nagging pain in the abdominal area.  You have persistent nausea, vomiting, or diarrhea.  You have a bad smelling vaginal discharge.  You have pain when you urinate. Get help right away if:  You have a fever.  You are leaking fluid from your vagina.  You have spotting or bleeding from your vagina.  You have severe abdominal cramping or pain.  You have rapid weight gain or weight loss.  You have shortness of breath with chest pain.  You notice sudden or extreme swelling of your face, hands, ankles, feet, or legs.  You have not felt your baby move in over an hour.  You have severe headaches that do not go away when you take medicine.  You have vision changes. Summary  The second trimester is from week 14 through week 27 (months 4 through 6). It is also a time when the fetus is growing rapidly.  Your body goes through many changes during pregnancy. The changes vary from woman to woman.  Avoid all smoking, herbs, alcohol, and unprescribed drugs. These chemicals affect the formation and growth your baby.  Do not use any tobacco products, such as cigarettes, chewing tobacco, and e-cigarettes. If you  need help quitting, ask your health care provider.  Contact your health care provider if you have any questions. Keep all prenatal visits as told by your health care provider. This is important. This information is not intended to replace advice given to you by your health care provider. Make sure you discuss any questions you have with your health care provider. Document Released: 06/17/2001 Document Revised: 11/29/2015 Document Reviewed: 08/24/2012 Elsevier Interactive Patient Education  2017 [employer].   Considering [place]? Guide for patients at [facility]) Why consider waterbirth? . Gentle birth for babies  . Less pain medicine used in labor  . May allow for passive descent/less pushing  . May reduce perineal tears  . More mobility and instinctive maternal position changes  . Increased maternal relaxation   Is waterbirth safe? What are the risks of infection, drowning or other complications? . Infection:  Marland Kitchen Very low risk (3.7 % for tub vs 4.8% for bed)  . 7 in 8000 waterbirths with documented infection  . Poorly cleaned equipment most common cause  . Slightly lower group B strep transmission rate  . Drowning  . Maternal:  . Very low risk  . Related to seizures or fainting  . Newborn:  Marland Kitchen Very low risk. No evidence of increased risk of respiratory problems in multiple large studies  . Physiological protection from breathing under water  . Avoid underwater birth if there are any fetal complications  . Once baby's head is out of the water, keep it out.  . Birth complication  . Some reports of cord trauma, but risk decreased by bringing baby to surface gradually  . No evidence of increased risk of shoulder dystocia. Mothers can usually change positions faster in water than in a bed, possibly aiding the maneuvers to free the shoulder.   There are 2 things you MUST do to have a waterbirth with [facility]: 1. Attend a waterbirth class at [employer] at [facility]  a. 3rd Wednesday of every month from 7-9 pm (virtual during COVID) b. Free c. Register by calling [phone] or register online at [email or web address] d. Bring [place] the certificate from the class to your prenatal appointment or send via MyChart 2. Meet with a midwife at 36 weeks* to see if you can still plan a waterbirth and to sign the consent.   *We also recommend that you schedule as many of your prenatal visits with a midwife as possible.    Helpful information: . You may want to bring a bathing suit top to the hospital to wear during labor but this is optional.  All other supplies are provided by the hospital. . Please arrive at the hospital with signs of active labor, and do not wait at home until late in labor. It takes 45 min- 2 hours for COVID testing, fetal monitoring, and check in to your room to take place, plus transport and filling of the waterbirth tub.    Things that would prevent you from having a waterbirth: . Unknown or Positive COVID-19 diagnosis upon admission to hospital* . Premature, <37wks  . Previous cesarean birth  . Presence of thick meconium-stained fluid  . Multiple gestation (Twins, triplets, etc.)  . Uncontrolled diabetes or gestational diabetes requiring medication  . Hypertension diagnosed in pregnancy or preexisting hypertension (gestational hypertension, preeclampsia, or chronic hypertension) . Heavy vaginal bleeding  . Non-reassuring fetal heart rate  . Active infection (MRSA, etc.). Group B Strep is NOT a contraindication for waterbirth.  . If your labor has to be induced and induction method requires continuous monitoring of the baby's heart rate  . Other risks/issues identified by your obstetrical provider   Please remember that birth is unpredictable. Under certain unforeseeable circumstances your provider may advise against giving birth in the tub. These decisions will be made on a case-by-case basis and with the safety  of you and your baby as our highest priority.   *Please remember that in order to have a waterbirth, you must test Negative to COVID-19 upon admission to the hospital.  Updated 05/22/2020

## 2020-06-14 NOTE — Progress Notes (Signed)
[place] 19+1 wks,cephalic,cervical length 3.1 cm,normal ovaries bilateral,anterior placenta gr 0,SVP of fluid 3.6 cm,FHR 146 bpm,EFW 290 g 60%,anatomy complete,no obvious abnormalities

## 2020-06-14 NOTE — Progress Notes (Signed)
LOW-RISK PREGNANCY VISIT Patient name: Caitlyn Green MRN [id number]  Date of birth: 16-Dec-2000 Chief Complaint:   No chief complaint on file.  History of Present Illness:   Caitlyn Green is a 19 y.o. G1P0000 female at [redacted]w[redacted]d with an Estimated Date of Delivery: 11/07/20 being seen today for ongoing management of a low-risk pregnancy.  Depression screen [facility] 2/9 04/30/2020 03/13/2020  Decreased Interest 1 0  Down, Depressed, Hopeless 1 1  PHQ - 2 Score 2 1  Altered sleeping 3 1  Tired, decreased energy 1 1  Change in appetite 1 0  Feeling bad or failure about yourself  0 1  Trouble concentrating 0 0  Moving slowly or fidgety/restless 0 0  Suicidal thoughts 0 0  PHQ-9 Score 7 4  Difficult doing work/chores - Not difficult at all    Today she reports no complaints. Contractions: Not present. Vag. Bleeding: None.  Movement: Present. denies leaking of fluid. Review of Systems:   Pertinent items are noted in HPI Denies abnormal vaginal discharge w/ itching/odor/irritation, headaches, visual changes, shortness of breath, chest pain, abdominal pain, severe nausea/vomiting, or problems with urination or bowel movements unless otherwise stated above. Pertinent History Reviewed:  Reviewed past medical,surgical, social, obstetrical and family history.  Reviewed problem list, medications and allergies. Physical Assessment:   Vitals:   06/14/20 1635  BP: 114/73  Pulse: 73  Weight: 140 lb (63.5 kg)  Body mass index is 19.53 kg/m.        Physical Examination:   General appearance: Well appearing, and in no distress  Mental status: Alert, oriented to person, place, and time  Skin: Warm & dry  Cardiovascular: Normal heart rate noted  Respiratory: Normal respiratory effort, no distress  Abdomen: Soft, gravid, nontender  Pelvic: Cervical exam deferred         Extremities: Edema: None  Fetal Status: Fetal Heart Rate (bpm): 146 u/s   Movement: Present    [place] 19+1 wks,cephalic,cervical length  3.1 cm,normal ovaries bilateral,anterior placenta grade 0,SVP of fluid 3.6 cm,FHR 146 bpm,EFW 290 g 60%,anatomy complete,no obvious abnormalities   Chaperone: N/A   Results for orders placed or performed in visit on 06/14/20 (from the past 24 hour(s))  POC Urinalysis Dipstick OB   Collection Time: 06/14/20  4:42 PM  Result Value Ref Range   Color, UA     Clarity, UA     Glucose, UA Negative Negative   Bilirubin, UA     Ketones, UA neg    Spec Grav, UA     Blood, UA neg    pH, UA     POC,PROTEIN,UA Negative Negative, Trace, Small (1+), Moderate (2+), Large (3+), 4+   Urobilinogen, UA     Nitrite, UA neg    Leukocytes, UA Negative Negative   Appearance     Odor      Assessment & Plan:  1) Low-risk pregnancy G1P0000 at [redacted]w[redacted]d with an Estimated Date of Delivery: 11/07/20   2) Alpha-thalassemia carrier, FOB wants testing, note routed to Tish   Meds: No orders of the defined types were placed in this encounter.  Labs/procedures today: anatomy u/s  Plan:  Continue routine obstetrical care  Next visit: prefers in person    Reviewed: Preterm labor symptoms and general obstetric precautions including but not limited to vaginal bleeding, contractions, leaking of fluid and fetal movement were reviewed in detail with the patient.  All questions were answered. Has home bp cuff.  Check bp weekly, let us know if >140/90.  Follow-up: Return in about 4 weeks (around 07/12/2020) for LROB, CNM, in person.  No future appointments.  Orders Placed This Encounter  Procedures  . POC Urinalysis Dipstick OB   Cheral Marker CNM, [facility] 06/14/2020 4:45 PM

## 2020-06-15 ENCOUNTER — Encounter: Payer: Self-pay | Admitting: *Deleted

## 2020-06-20 ENCOUNTER — Emergency Department (HOSPITAL_COMMUNITY)
Admission: EM | Admit: 2020-06-20 | Discharge: 2020-06-20 | Disposition: A | Discharge status: Home / Self Care | Payer: BC Managed Care – PPO | Attending: Emergency Medicine | Admitting: Emergency Medicine

## 2020-06-20 ENCOUNTER — Other Ambulatory Visit: Payer: Self-pay

## 2020-06-20 DIAGNOSIS — Z7982 Long term (current) use of aspirin: Secondary | ICD-10-CM | POA: Diagnosis not present

## 2020-06-20 DIAGNOSIS — J069 Acute upper respiratory infection, unspecified: Secondary | ICD-10-CM | POA: Diagnosis not present

## 2020-06-20 DIAGNOSIS — Z3A19 19 weeks gestation of pregnancy: Secondary | ICD-10-CM | POA: Diagnosis not present

## 2020-06-20 DIAGNOSIS — Z20822 Contact with and (suspected) exposure to covid-19: Secondary | ICD-10-CM | POA: Diagnosis not present

## 2020-06-20 DIAGNOSIS — J45909 Unspecified asthma, uncomplicated: Secondary | ICD-10-CM | POA: Insufficient documentation

## 2020-06-20 DIAGNOSIS — O99512 Diseases of the respiratory system complicating pregnancy, second trimester: Secondary | ICD-10-CM | POA: Diagnosis not present

## 2020-06-20 DIAGNOSIS — B9789 Other viral agents as the cause of diseases classified elsewhere: Secondary | ICD-10-CM | POA: Diagnosis not present

## 2020-06-20 LAB — URINALYSIS, ROUTINE W REFLEX MICROSCOPIC
Bilirubin Urine: NEGATIVE
Glucose, UA: NEGATIVE mg/dL
Hgb urine dipstick: NEGATIVE
Ketones, ur: NEGATIVE mg/dL
Leukocytes,Ua: NEGATIVE
Nitrite: NEGATIVE
Protein, ur: NEGATIVE mg/dL
Specific Gravity, Urine: 1.008 (ref 1.005–1.030)
pH: 6 (ref 5.0–8.0)

## 2020-06-20 LAB — RESP PANEL BY RT-PCR (FLU A&B, COVID) ARPGX2
Influenza A by PCR: NEGATIVE
Influenza B by PCR: NEGATIVE
SARS Coronavirus 2 by RT PCR: NEGATIVE

## 2020-06-20 LAB — GROUP A STREP BY PCR: Group A Strep by PCR: NOT DETECTED

## 2020-06-20 NOTE — ED Notes (Signed)
ED Provider at bedside. Throat re-swabbed with eswab.

## 2020-06-20 NOTE — ED Triage Notes (Signed)
Pt states she had had sore throat, cough, N/V, back pain and chest soreness since this morning. Pt currently 5 months pregnant, called her OBGYN after hours number and was instructed to come to the ED.

## 2020-06-20 NOTE — ED Provider Notes (Addendum)
[facility] EMERGENCY DEPARTMENT Provider Note   CSN: [id number] Arrival date & time: 06/20/20  0304   Time seen 4:10 AM  History Chief Complaint  Patient presents with  . Chest Pain  . Sore Throat    Caitlyn Green is a 19 y.o. female.  HPI   Patient is G1, P0 Ab0, 19 weeks [facility].  She states she has had a normal pregnancy so far.  She states this morning, December 14 she woke up with a cough with some yellow mucus production, some green and white rhinorrhea and sore throat.  She denies fever.  She has had nausea and vomited twice today but no diarrhea or abdominal pain.  She states she is having some low back pain.  She also has some central chest pain that is like a pressure and sometimes when she throws up she feels acid but otherwise she does not have any acid reflux symptoms.  Patient states she has had the Materna vaccine, she is due to have her booster done in 2 days.  PCP Haubert, Vernell Leep, PA-C  Past Medical History:  Diagnosis Date  . Back strain   . Rheumatoid arthritis [facility])     Patient Active Problem List   Diagnosis Date Noted  . Abnormal chromosomal and genetic finding on antenatal screening mother 05/21/2020  . Supervision of normal first pregnancy 04/26/2020  . 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 Asthma 04/05/2012    Past Surgical History:  Procedure Laterality Date  . NO PAST SURGERIES       OB History    Gravida  1   Para  0   Term  0   Preterm  0   AB  0   Living  0     SAB  0   IAB  0   Ectopic  0   Multiple  0   Live Births  0           Family History  Problem Relation Age of Onset  . Healthy Mother   . Cancer Father   . Arthritis Maternal Grandmother   . Healthy Sister   . Healthy Sister   . Healthy Brother     Social History   Tobacco Use  . Smoking status: Never Smoker  . Smokeless tobacco: Never Used  Vaping Use  . Vaping Use: Former  Substance Use Topics   . Alcohol use: No  . Drug use: No    Home Medications Prior to Admission medications   Medication Sig Start Date End Date Taking? Authorizing Provider  Adalimumab 40 MG/0.4ML PNKT Inject into the skin. Patient not taking: No sig reported 09/16/19   [provider]  aspirin 81 MG EC tablet Take 1 tablet (81 mg total) by mouth daily. Swallow whole. 04/30/20   Cheral Marker, CNM  ondansetron (ZOFRAN ODT) 4 MG disintegrating tablet Take 1 tablet (4 mg total) by mouth every 6 (six) hours as needed for nausea. Patient not taking: Reported on 06/14/2020 05/24/20   Jacklyn Shell, CNM  Prenatal Vit-Iron Carbonyl-FA (PRENATAL PLUS IRON) 29-1 MG TABS Take 1 daily 03/13/20   Adline Potter, NP  promethazine (PHENERGAN) 25 MG tablet Take 1 tablet (25 mg total) by mouth every 6 (six) hours as needed for nausea or vomiting. Patient not taking: No sig reported 04/23/20   Jacklyn Shell, CNM    Allergies    Patient has no known allergies.  Review of Systems  Review of Systems  All other systems reviewed and are negative.   Physical Exam Updated Vital Signs BP 121/63 (BP Location: Right Arm)   Pulse 87   Temp 99.6 F (37.6 C) (Oral)   Resp 18   Ht 5\' 11"  (1.803 m)   Wt 63.5 kg   LMP 01/22/2020 (Approximate)   SpO2 100%   BMI 19.53 kg/m   Physical Exam Vitals and nursing note reviewed.  Constitutional:      General: She is not in acute distress.    Appearance: Normal appearance. She is normal weight. She is not ill-appearing or toxic-appearing.  HENT:     Head: Normocephalic and atraumatic.     Right Ear: External ear normal.     Left Ear: External ear normal.     Mouth/Throat:     Mouth: Mucous membranes are moist.     Pharynx: Posterior oropharyngeal erythema present. No oropharyngeal exudate.     Comments: Patient's voice is hoarse Eyes:     Extraocular Movements: Extraocular movements intact.     Conjunctiva/sclera: Conjunctivae normal.      Pupils: Pupils are equal, round, and reactive to light.  Cardiovascular:     Rate and Rhythm: Normal rate and regular rhythm.     Pulses: Normal pulses.     Heart sounds: Normal heart sounds.  Pulmonary:     Effort: Pulmonary effort is normal. No respiratory distress.     Breath sounds: Normal breath sounds.  Abdominal:     General: Bowel sounds are normal.     Palpations: Abdomen is soft.     Tenderness: There is no abdominal tenderness.  Musculoskeletal:        General: Normal range of motion.     Cervical back: Normal range of motion.  Skin:    General: Skin is warm and dry.     Capillary Refill: Capillary refill takes less than 2 seconds.  Neurological:     General: No focal deficit present.     Mental Status: She is alert and oriented to person, place, and time.     Cranial Nerves: No cranial nerve deficit.  Psychiatric:        Mood and Affect: Mood normal.        Behavior: Behavior normal.        Thought Content: Thought content normal.     ED Results / Procedures / Treatments   Labs (all labs ordered are listed, but only abnormal results are displayed) Results for orders placed or performed during the hospital encounter of 06/20/20  Group A Strep by PCR   Specimen: Throat; Sterile Swab  Result Value Ref Range   Group A Strep by PCR NOT DETECTED NOT DETECTED  Resp Panel by RT-PCR (Flu A&B, Covid) Nasopharyngeal Swab   Specimen: Nasopharyngeal Swab; Nasopharyngeal(NP) swabs in vial transport medium  Result Value Ref Range   SARS Coronavirus 2 by RT PCR NEGATIVE NEGATIVE   Influenza A by PCR NEGATIVE NEGATIVE   Influenza B by PCR NEGATIVE NEGATIVE  Urinalysis, Routine w reflex microscopic Urine, Clean Catch  Result Value Ref Range   Color, Urine STRAW (A) YELLOW   APPearance CLEAR CLEAR   Specific Gravity, Urine 1.008 1.005 - 1.030   pH 6.0 5.0 - 8.0   Glucose, UA NEGATIVE NEGATIVE mg/dL   Hgb urine dipstick NEGATIVE NEGATIVE   Bilirubin Urine NEGATIVE  NEGATIVE   Ketones, ur NEGATIVE NEGATIVE mg/dL   Protein, ur NEGATIVE NEGATIVE mg/dL   Nitrite NEGATIVE NEGATIVE  Leukocytes,Ua NEGATIVE NEGATIVE    Laboratory interpretation all normal    EKG EKG Interpretation  Date/Time:  Wednesday June 20 2020 03:53:20 EST Ventricular Rate:  89 PR Interval:    QRS Duration: 70 QT Interval:  361 QTC Calculation: 440 R Axis:   76 Text Interpretation: Sinus rhythm Normal ECG No old tracing to compare Confirmed by Devoria Albe ([id number]) on 06/20/2020 4:42:27 AM   Radiology No results found.   US OB Comp + 14 Wk  Result Date: 06/14/2020  [facility] [address] [place] [address] SECOND TRIMESTER ANATOMY SONOGRAM [name]  1.  G1P0000 Estimated Date of Delivery: 11/07/20 by  early ultrasound and confirmed by today's sonographic dating 2.  Normal fetal sonographic findings, specifically normal detailed anatomical evaluation,      no abnormalities noted 3.  Normal general sonographic findings Recommend routine prenatal care based on this sonogram or as clinically indicated Lazaro Arms 06/14/2020 4:33 PM    Procedures Procedures (including critical care time)  Medications Ordered in ED Medications - No data to display  ED Course  I have reviewed the triage vital signs and the nursing notes.  Pertinent labs & imaging results that were available during my care of the patient were reviewed by me and considered in my medical decision making (see chart for details).    MDM Rules/Calculators/A&P                          Fetal heart rate was 156.  I suspect patient has a viral URI which is common in pregnancy however rapid strep was done and Covid testing and influenza testing was done to make sure they are not present.  Chest x-ray was not done due to pregnancy.  Patient has no fever, hypoxia, and no abnormal lung sounds.  She has hoarseness which also goes along with it being a viral infection.   Antibiotics are not indicated at this time.   Final Clinical Impression(s) / ED Diagnoses Final diagnoses:  Viral upper respiratory tract infection  [redacted] weeks gestation of pregnancy    Rx / DC Orders ED Discharge Orders    None    OTC acetaminophen  Plan discharge  Devoria Albe, MD, Concha Pyo, MD 06/20/20 Curtis Sites    Devoria Albe, MD 06/20/20 [phone]

## 2020-06-20 NOTE — ED Notes (Signed)
Pt reports she has CP,SOB, N/V, and sore throat. Reports she is approx. 5 months pregnant and she has not felt fetal movement since yesterday morning. covid swab and throat swab obtained and walked to lab. Fetal heart tones obtained at 152-156bpm, Dr. Lynelle Doctor aware. Dr. Lynelle Doctor reviewed EKG. Pt up to bathroom to provide urine sample, steady gait observed. Returned to bed.will continue to monitor.

## 2020-06-20 NOTE — Discharge Instructions (Addendum)
Drink plenty of fluids.  You can use over-the-counter cough drops for comfort for your sore throat.  You can also try salt water gargles but spit it out do not swallow.  You can take acetaminophen 650 mg every 6 hours as needed for pain.  If you are having difficulty swallowing the pills you can do the children's liquid.  Please call [facility] this morning and see what you can take over-the-counter for your cough and runny nose.  Return to the emergency department if you get a fever, you struggle to breathe, or you get abdominal pain.

## 2020-06-22 DIAGNOSIS — Z23 Encounter for immunization: Secondary | ICD-10-CM | POA: Diagnosis not present

## 2020-07-04 ENCOUNTER — Encounter: Payer: Self-pay | Admitting: *Deleted

## 2020-07-04 DIAGNOSIS — Z3402 Encounter for supervision of normal first pregnancy, second trimester: Secondary | ICD-10-CM

## 2020-07-07 NOTE — L&D Delivery Note (Signed)
Delivery Note Caitlyn Green is a 20 y.o. G1P0000 at [redacted]w[redacted]d admitted for IOL for SROM.   GBS Status: negative Negative/-- (04/08 1202)  Labor course: Initial SVE: 1/thick/-. Augmentation with: Pitocin and Cytotec. She then progressed to complete.  ROM: 32h 6m with clear fluid  Birth: At 1410 a viable female was delivered via spontaneous vaginal delivery (Presentation:LOA;cephalic). Nuchal cord present: Yes. x1. Shoulders and body delivered in usual fashion. Infant placed directly on mom's abdomen for bonding/skin-to-skin, baby dried and stimulated. Cord clamped x 2 after 1 minute and cut by FOB.Cord blood collected. The placenta separated spontaneously and delivered via gentle cord traction.  Pitocin infused rapidly IV per protocol.  Fundus firm with massage.  Placenta inspected and appears to be intact with a 3 VC.  Placenta/Cord with the following complications: none.  Cord pH: n/a Sponge and instrument count were correct x2.  Intrapartum complications:  IUGR Anesthesia:  local and epidural Episiotomy: none Lacerations:  Left labial Suture Repair: 2.0 vicryl EBL (mL): 200   Infant: APGAR (1 MIN): 6  APGAR (5 MINS): 8  APGAR (10 MINS):    Infant weight: pending  Mom to postpartum.  Baby to Couplet care / Skin to Skin. Placenta to Pathology for IUGR   Plans to Breastfeed Contraception: POPs Circumcision: wants inpatient, needs consent  Note sent to [facility]: FT for pp visit.  Brand Males [facility] 10/25/2020 2:37 PM

## 2020-07-12 ENCOUNTER — Ambulatory Visit (INDEPENDENT_AMBULATORY_CARE_PROVIDER_SITE_OTHER): Payer: BC Managed Care – PPO | Admitting: Obstetrics and Gynecology

## 2020-07-12 ENCOUNTER — Other Ambulatory Visit: Payer: Self-pay

## 2020-07-12 ENCOUNTER — Encounter: Payer: Self-pay | Admitting: Obstetrics and Gynecology

## 2020-07-12 VITALS — BP 107/61 | HR 73 | Wt 144.0 lb

## 2020-07-12 DIAGNOSIS — O285 Abnormal chromosomal and genetic finding on antenatal screening of mother: Secondary | ICD-10-CM

## 2020-07-12 DIAGNOSIS — Z3402 Encounter for supervision of normal first pregnancy, second trimester: Secondary | ICD-10-CM

## 2020-07-12 DIAGNOSIS — Z1389 Encounter for screening for other disorder: Secondary | ICD-10-CM

## 2020-07-12 DIAGNOSIS — Z331 Pregnant state, incidental: Secondary | ICD-10-CM

## 2020-07-12 LAB — POCT URINALYSIS DIPSTICK OB
Blood, UA: NEGATIVE
Glucose, UA: NEGATIVE
Ketones, UA: NEGATIVE
Leukocytes, UA: NEGATIVE
Nitrite, UA: NEGATIVE
POC,PROTEIN,UA: NEGATIVE

## 2020-07-12 NOTE — Progress Notes (Signed)
Subjective:  Caitlyn Green is a 20 y.o. G1P0000 at [redacted]w[redacted]d being seen today for ongoing prenatal care.  She is currently monitored for the following issues for this low-risk pregnancy and has Asthma; JIA (juvenile idiopathic arthritis) (HCC); Supervision of normal first pregnancy; and Abnormal chromosomal and genetic finding on antenatal screening mother on their problem list.  Patient reports no complaints.  Contractions: Not present.  .  Movement: Present. Denies leaking of fluid.   The following portions of the patient's history were reviewed and updated as appropriate: allergies, current medications, past family history, past medical history, past social history, past surgical history and problem list. Problem list updated.  Objective:   Vitals:   07/12/20 1411  BP: 107/61  Pulse: 73  Weight: 144 lb (65.3 kg)    Fetal Status:     Movement: Present     General:  Alert, oriented and cooperative. Patient is in no acute distress.  Skin: Skin is warm and dry. No rash noted.   Cardiovascular: Normal heart rate noted  Respiratory: Normal respiratory effort, no problems with respiration noted  Abdomen: Soft, gravid, appropriate for gestational age. Pain/Pressure: Absent     Pelvic:  Cervical exam deferred        Extremities: Normal range of motion.  Edema: None  Mental Status: Normal mood and affect. Normal behavior. Normal judgment and thought content.   Urinalysis:      Assessment and Plan:  Pregnancy: G1P0000 at [redacted]w[redacted]d  1. Encounter for supervision of normal first pregnancy in second trimester Stable Glucola next visit  2. Abnormal chromosomal and genetic finding on antenatal screening mother FOB being tested  3. Pregnant state, incidental  - POC Urinalysis Dipstick OB  4. Screening for genitourinary condition  - POC Urinalysis Dipstick OB  Preterm labor symptoms and general obstetric precautions including but not limited to vaginal bleeding, contractions, leaking of  fluid and fetal movement were reviewed in detail with the patient. Please refer to After Visit Summary for other counseling recommendations.  Return in about 4 weeks (around 08/09/2020) for OB visit, face to face, any provider,glocola.   Hermina Staggers, MD

## 2020-07-12 NOTE — Patient Instructions (Signed)

## 2020-08-09 ENCOUNTER — Other Ambulatory Visit: Payer: BC Managed Care – PPO

## 2020-08-09 ENCOUNTER — Encounter: Payer: BC Managed Care – PPO | Admitting: Advanced Practice Midwife

## 2020-08-15 ENCOUNTER — Telehealth (INDEPENDENT_AMBULATORY_CARE_PROVIDER_SITE_OTHER): Payer: BC Managed Care – PPO | Admitting: Obstetrics and Gynecology

## 2020-08-15 ENCOUNTER — Other Ambulatory Visit: Payer: BC Managed Care – PPO

## 2020-08-15 ENCOUNTER — Encounter: Payer: Self-pay | Admitting: Obstetrics and Gynecology

## 2020-08-15 DIAGNOSIS — O99513 Diseases of the respiratory system complicating pregnancy, third trimester: Secondary | ICD-10-CM

## 2020-08-15 DIAGNOSIS — O285 Abnormal chromosomal and genetic finding on antenatal screening of mother: Secondary | ICD-10-CM

## 2020-08-15 DIAGNOSIS — Z3402 Encounter for supervision of normal first pregnancy, second trimester: Secondary | ICD-10-CM

## 2020-08-15 DIAGNOSIS — M08069 Unspecified juvenile rheumatoid arthritis, unspecified knee: Secondary | ICD-10-CM

## 2020-08-15 DIAGNOSIS — Z131 Encounter for screening for diabetes mellitus: Secondary | ICD-10-CM | POA: Diagnosis not present

## 2020-08-15 DIAGNOSIS — Z3A27 27 weeks gestation of pregnancy: Secondary | ICD-10-CM

## 2020-08-15 DIAGNOSIS — J45909 Unspecified asthma, uncomplicated: Secondary | ICD-10-CM

## 2020-08-15 DIAGNOSIS — O99891 Other specified diseases and conditions complicating pregnancy: Secondary | ICD-10-CM

## 2020-08-15 DIAGNOSIS — Z3A28 28 weeks gestation of pregnancy: Secondary | ICD-10-CM

## 2020-08-15 NOTE — Progress Notes (Signed)
   OBSTETRICS PRENATAL VIRTUAL VISIT ENCOUNTER NOTE  Provider location: [facility] for [facility]   I connected with Caitlyn Green on 08/15/20 at 10:10 AM EST by MyChart Video Encounter at home and verified that I am speaking with the correct person using two identifiers.   I discussed the limitations, risks, security and privacy concerns of performing an evaluation and management service virtually and the availability of in person appointments. I also discussed with the patient that there may be a patient responsible charge related to this service. The patient expressed understanding and agreed to proceed. Subjective:  Caitlyn Green is a 20 y.o. G1P0000 at [redacted]w[redacted]d being seen today for ongoing prenatal care.  She is currently monitored for the following issues for this low-risk pregnancy and has Asthma; JIA (juvenile idiopathic arthritis) (HCC); Supervision of normal first pregnancy; and Abnormal chromosomal and genetic finding on antenatal screening mother on their problem list.  Patient reports general discomforts of pregnancy.  Contractions: Not present. Vag. Bleeding: None.  Movement: Present. Denies any leaking of fluid.   The following portions of the patient's history were reviewed and updated as appropriate: allergies, current medications, past family history, past medical history, past social history, past surgical history and problem list.   Objective:   Vitals:   08/15/20 1021  BP: 112/79  Pulse: 89    Fetal Status:     Movement: Present     General:  Alert, oriented and cooperative. Patient is in no acute distress.  Respiratory: Normal respiratory effort, no problems with respiration noted  Mental Status: Normal mood and affect. Normal behavior. Normal judgment and thought content.  Rest of physical exam deferred due to type of encounter  Imaging: No results found.  Assessment and Plan:  Pregnancy: G1P0000 at [redacted]w[redacted]d 1. Encounter for supervision of  normal first pregnancy in second trimester Stable Glucola today  Preterm labor symptoms and general obstetric precautions including but not limited to vaginal bleeding, contractions, leaking of fluid and fetal movement were reviewed in detail with the patient. I discussed the assessment and treatment plan with the patient. The patient was provided an opportunity to ask questions and all were answered. The patient agreed with the plan and demonstrated an understanding of the instructions. The patient was advised to call back or seek an in-person office evaluation/go to [facility] at [facility] for any urgent or concerning symptoms. Please refer to After Visit Summary for other counseling recommendations.   I provided 8 minutes of face-to-face time during this encounter.  Return in about 2 weeks (around 08/29/2020) for virtual.  No future appointments.  Hermina Staggers, MD [facility] for [facility], [facility]

## 2020-08-16 LAB — GLUCOSE TOLERANCE, 2 HOURS W/ 1HR
Glucose, 1 hour: 127 mg/dL (ref 65–179)
Glucose, 2 hour: 97 mg/dL (ref 65–152)
Glucose, Fasting: 78 mg/dL (ref 65–91)

## 2020-08-16 LAB — RPR: RPR Ser Ql: NONREACTIVE

## 2020-08-16 LAB — CBC
Hematocrit: 28.9 % — ABNORMAL LOW (ref 34.0–46.6)
Hemoglobin: 9.8 g/dL — ABNORMAL LOW (ref 11.1–15.9)
MCH: 29.1 pg (ref 26.6–33.0)
MCHC: 33.9 g/dL (ref 31.5–35.7)
MCV: 86 fL (ref 79–97)
Platelets: 224 10*3/uL (ref 150–450)
RBC: 3.37 x10E6/uL — ABNORMAL LOW (ref 3.77–5.28)
RDW: 12.6 % (ref 11.7–15.4)
WBC: 4.4 10*3/uL (ref 3.4–10.8)

## 2020-08-16 LAB — ANTIBODY SCREEN: Antibody Screen: NEGATIVE

## 2020-08-16 LAB — HIV ANTIBODY (ROUTINE TESTING W REFLEX): HIV Screen 4th Generation wRfx: NONREACTIVE

## 2020-08-31 ENCOUNTER — Other Ambulatory Visit: Payer: Self-pay

## 2020-08-31 ENCOUNTER — Ambulatory Visit (INDEPENDENT_AMBULATORY_CARE_PROVIDER_SITE_OTHER): Payer: BC Managed Care – PPO | Admitting: Advanced Practice Midwife

## 2020-08-31 VITALS — BP 112/74 | HR 77 | Wt 145.0 lb

## 2020-08-31 DIAGNOSIS — O26849 Uterine size-date discrepancy, unspecified trimester: Secondary | ICD-10-CM | POA: Insufficient documentation

## 2020-08-31 DIAGNOSIS — Z3403 Encounter for supervision of normal first pregnancy, third trimester: Secondary | ICD-10-CM

## 2020-08-31 DIAGNOSIS — Z3A30 30 weeks gestation of pregnancy: Secondary | ICD-10-CM

## 2020-08-31 DIAGNOSIS — O26843 Uterine size-date discrepancy, third trimester: Secondary | ICD-10-CM

## 2020-08-31 NOTE — Progress Notes (Signed)
   LOW-RISK PREGNANCY VISIT Patient name: Caitlyn Green MRN [id number]  Date of birth: 2000/10/16 Chief Complaint:   No chief complaint on file.  History of Present Illness:   Caitlyn Green is a 20 y.o. G6P0000 female at [redacted]w[redacted]d with an Estimated Date of Delivery: 11/07/20 being seen today for ongoing management of a low-risk pregnancy.  Today she reports wanting to discuss Tdap with her mother first; FOB is to be testing next week by the mobile service; interested in hydrotherapy but not necessarily waterbirth. Contractions: Not present. Vag. Bleeding: None.  Movement: Present. denies leaking of fluid. Review of Systems:   Pertinent items are noted in HPI Denies abnormal vaginal discharge w/ itching/odor/irritation, headaches, visual changes, shortness of breath, chest pain, abdominal pain, severe nausea/vomiting, or problems with urination or bowel movements unless otherwise stated above. Pertinent History Reviewed:  Reviewed past medical,surgical, social, obstetrical and family history.  Reviewed problem list, medications and allergies. Physical Assessment:   Vitals:   08/31/20 0840  BP: 112/74  Pulse: 77  Weight: 145 lb (65.8 kg)  Body mass index is 20.22 kg/m.        Physical Examination:   General appearance: Well appearing, and in no distress  Mental status: Alert, oriented to person, place, and time  Skin: Warm & dry  Cardiovascular: Normal heart rate noted  Respiratory: Normal respiratory effort, no distress  Abdomen: Soft, gravid, nontender  Pelvic: Cervical exam deferred         Extremities: Edema: Trace  Fetal Status: Fetal Heart Rate (bpm): 135 Fundal Height: 25 cm Movement: Present    No results found for this or any previous visit (from the past 24 hour(s)).  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  Labs/procedures today: Tdap info given- will discuss first w her mother  Plan:  Continue routine obstetrical care with growth scan due to S<D  Reviewed: Preterm labor symptoms and general obstetric precautions including but not limited to vaginal bleeding, contractions, leaking of fluid and fetal movement were reviewed in detail with the patient.  All questions were answered. Has home bp cuff. Check bp weekly, let us know if >140/90.   Follow-up: Return for 1st avail growth u/s, then 2wk LROB, in person.  Orders Placed This Encounter  Procedures  . US OB Follow Up   Caitlyn Green [facility] 08/31/2020 8:59 AM

## 2020-08-31 NOTE — Patient Instructions (Signed)
[name], I greatly value your feedback.  If you receive a survey following your visit with Caitlyn Green today, we appreciate you taking the time to fill it out.  Thanks, [name] CNM   [facility] ([address] [place], [place] [address]) Entrance C, located off of [address] valet parking  Go to [email or web address] to register for FREE online childbirth classes   Call the office [phone]) or go to [facility] if:  You begin to have strong, frequent contractions  Your water breaks.  Sometimes it is a big gush of fluid, sometimes it is just a trickle that keeps getting your panties wet or running down your legs  You have vaginal bleeding.  It is normal to have a small amount of spotting if your cervix was checked.   You don't feel your baby moving like normal.  If you don't, get you something to eat and drink and lay down and focus on feeling your baby move.  You should feel at least 10 movements in 2 hours.  If you don't, you should call the office or go to [facility].    Tdap Vaccine  It is recommended that you get the Tdap vaccine during the third trimester of EACH pregnancy to help protect your baby from getting pertussis (whooping cough)  27-36 weeks is the BEST time to do this so that you can pass the protection on to your baby. During pregnancy is better than after pregnancy, but if you are unable to get it during pregnancy it will be offered at the hospital.   You can get this vaccine with Caitlyn Green, at the health department, your family doctor, or some local pharmacies  Everyone who will be around your baby should also be up-to-date on their vaccines before the baby comes. Adults (who are not pregnant) only need 1 dose of Tdap during adulthood.   [place] Pediatricians/Family Doctors:  [facility] [phone]            [facility] [phone]                 [facility] [phone]  (usually not accepting new patients unless you have family there already, you are always welcome to call and ask)       [facility] Department [phone]       [facility] Pediatricians/Family Doctors:   [facility]: [phone]  [facility]: [phone]  [facility]: [phone]  [facility] Doctors:   [facility]: [phone]   [facility]: [phone]  [facility] Doctors:  [facility]: [phone]   Home Blood Pressure Monitoring for Patients   Your provider has recommended that you check your blood pressure (BP) at least once a week at home. If you do not have a blood pressure cuff at home, one will be provided for you. Contact your provider if you have not received your monitor within 1 week.   Helpful Tips for Accurate Home Blood Pressure Checks  . Don't smoke, exercise, or drink caffeine 30 minutes before checking your BP . Use the restroom before checking your BP (a full bladder can raise your pressure) . Relax in a comfortable upright chair . Feet on the ground . Left arm resting comfortably on a flat surface at the level of your heart . Legs uncrossed . Back supported . Sit quietly and don't talk . Place the cuff on your bare arm .  Adjust snuggly, so that only two fingertips can fit between your skin and the top of the cuff . Check 2 readings separated by at least one minute . Keep a log of your BP readings . For a visual, please reference this diagram: http://ccnc.care/bpdiagram  Provider Name: [facility]     Phone: [phone]  Zone 1: ALL CLEAR  Continue to monitor your symptoms:  . BP reading is less than 140 (top number) or less than 90 (bottom number)  . No right upper stomach pain . No headaches or seeing spots . No feeling nauseated or throwing up . No swelling in face and hands  Zone 2: CAUTION Call your doctor's office for  any of the following:  . BP reading is greater than 140 (top number) or greater than 90 (bottom number)  . Stomach pain under your ribs in the middle or right side . Headaches or seeing spots . Feeling nauseated or throwing up . Swelling in face and hands  Zone 3: EMERGENCY  Seek immediate medical care if you have any of the following:  . BP reading is greater than160 (top number) or greater than 110 (bottom number) . Severe headaches not improving with Tylenol . Serious difficulty catching your breath . Any worsening symptoms from Zone 2   Third Trimester of Pregnancy The third trimester is from week 29 through week 42, months 7 through 9. The third trimester is a time when the fetus is growing rapidly. At the end of the ninth month, the fetus is about 20 inches in length and weighs 6-10 pounds.  BODY CHANGES Your body goes through many changes during pregnancy. The changes vary from woman to woman.   Your weight will continue to increase. You can expect to gain 25-35 pounds (11-16 kg) by the end of the pregnancy.  You may begin to get stretch marks on your hips, abdomen, and breasts.  You may urinate more often because the fetus is moving lower into your pelvis and pressing on your bladder.  You may develop or continue to have heartburn as a result of your pregnancy.  You may develop constipation because certain hormones are causing the muscles that push waste through your intestines to slow down.  You may develop hemorrhoids or swollen, bulging veins (varicose veins).  You may have pelvic pain because of the weight gain and pregnancy hormones relaxing your joints between the bones in your pelvis. Backaches may result from overexertion of the muscles supporting your posture.  You may have changes in your hair. These can include thickening of your hair, rapid growth, and changes in texture. Some women also have hair loss during or after pregnancy, or hair that feels dry or thin.  Your hair will most likely return to normal after your baby is born.  Your breasts will continue to grow and be tender. A yellow discharge may leak from your breasts called colostrum.  Your belly button may stick out.  You may feel short of breath because of your expanding uterus.  You may notice the fetus "dropping," or moving lower in your abdomen.  You may have a bloody mucus discharge. This usually occurs a few days to a week before labor begins.  Your cervix becomes thin and soft (effaced) near your due date. WHAT TO EXPECT AT YOUR PRENATAL EXAMS  You will have prenatal exams every 2 weeks until week 36. Then, you will have weekly prenatal exams. During a routine prenatal visit:  You will be  weighed to make sure you and the fetus are growing normally.  Your blood pressure is taken.  Your abdomen will be measured to track your baby's growth.  The fetal heartbeat will be listened to.  Any test results from the previous visit will be discussed.  You may have a cervical check near your due date to see if you have effaced. At around 36 weeks, your caregiver will check your cervix. At the same time, your caregiver will also perform a test on the secretions of the vaginal tissue. This test is to determine if a type of bacteria, Group B streptococcus, is present. Your caregiver will explain this further. Your caregiver may ask you:  What your birth plan is.  How you are feeling.  If you are feeling the baby move.  If you have had any abnormal symptoms, such as leaking fluid, bleeding, severe headaches, or abdominal cramping.  If you have any questions. Other tests or screenings that may be performed during your third trimester include:  Blood tests that check for low iron levels (anemia).  Fetal testing to check the health, activity level, and growth of the fetus. Testing is done if you have certain medical conditions or if there are problems during the pregnancy. FALSE  LABOR You may feel small, irregular contractions that eventually go away. These are called Braxton Hicks contractions, or false labor. Contractions may last for hours, days, or even weeks before true labor sets in. If contractions come at regular intervals, intensify, or become painful, it is best to be seen by your caregiver.  SIGNS OF LABOR   Menstrual-like cramps.  Contractions that are 5 minutes apart or less.  Contractions that start on the top of the uterus and spread down to the lower abdomen and back.  A sense of increased pelvic pressure or back pain.  A watery or bloody mucus discharge that comes from the vagina. If you have any of these signs before the 37th week of pregnancy, call your caregiver right away. You need to go to the hospital to get checked immediately. HOME CARE INSTRUCTIONS   Avoid all smoking, herbs, alcohol, and unprescribed drugs. These chemicals affect the formation and growth of the baby.  Follow your caregiver's instructions regarding medicine use. There are medicines that are either safe or unsafe to take during pregnancy.  Exercise only as directed by your caregiver. Experiencing uterine cramps is a good sign to stop exercising.  Continue to eat regular, healthy meals.  Wear a good support bra for breast tenderness.  Do not use hot tubs, steam rooms, or saunas.  Wear your seat belt at all times when driving.  Avoid raw meat, uncooked cheese, cat litter boxes, and soil used by cats. These carry germs that can cause birth defects in the baby.  Take your prenatal vitamins.  Try taking a stool softener (if your caregiver approves) if you develop constipation. Eat more high-fiber foods, such as fresh vegetables or fruit and whole grains. Drink plenty of fluids to keep your urine clear or pale yellow.  Take warm sitz baths to soothe any pain or discomfort caused by hemorrhoids. Use hemorrhoid cream if your caregiver approves.  If you develop varicose  veins, wear support hose. Elevate your feet for 15 minutes, 3-4 times a day. Limit salt in your diet.  Avoid heavy lifting, wear low heal shoes, and practice good posture.  Rest a lot with your legs elevated if you have leg cramps or low back pain.  Visit your dentist if you have not gone during your pregnancy. Use a soft toothbrush to brush your teeth and be gentle when you floss.  A sexual relationship may be continued unless your caregiver directs you otherwise.  Do not travel far distances unless it is absolutely necessary and only with the approval of your caregiver.  Take prenatal classes to understand, practice, and ask questions about the labor and delivery.  Make a trial run to the hospital.  Pack your hospital bag.  Prepare the baby's nursery.  Continue to go to all your prenatal visits as directed by your caregiver. SEEK MEDICAL CARE IF:  You are unsure if you are in labor or if your water has broken.  You have dizziness.  You have mild pelvic cramps, pelvic pressure, or nagging pain in your abdominal area.  You have persistent nausea, vomiting, or diarrhea.  You have a bad smelling vaginal discharge.  You have pain with urination. SEEK IMMEDIATE MEDICAL CARE IF:   You have a fever.  You are leaking fluid from your vagina.  You have spotting or bleeding from your vagina.  You have severe abdominal cramping or pain.  You have rapid weight loss or gain.  You have shortness of breath with chest pain.  You notice sudden or extreme swelling of your face, hands, ankles, feet, or legs.  You have not felt your baby move in over an hour.  You have severe headaches that do not go away with medicine.  You have vision changes. Document Released: 06/17/2001 Document Revised: 06/28/2013 Document Reviewed: 08/24/2012 [facility] Patient Information 2015 [place], [place]. This information is not intended to replace advice given to you by your health care provider. Make  sure you discuss any questions you have with your health care provider.

## 2020-09-06 ENCOUNTER — Other Ambulatory Visit: Payer: Self-pay

## 2020-09-06 ENCOUNTER — Ambulatory Visit (INDEPENDENT_AMBULATORY_CARE_PROVIDER_SITE_OTHER): Payer: BC Managed Care – PPO

## 2020-09-06 DIAGNOSIS — Z3A31 31 weeks gestation of pregnancy: Secondary | ICD-10-CM

## 2020-09-06 DIAGNOSIS — Z3403 Encounter for supervision of normal first pregnancy, third trimester: Secondary | ICD-10-CM

## 2020-09-06 DIAGNOSIS — O26843 Uterine size-date discrepancy, third trimester: Secondary | ICD-10-CM

## 2020-09-06 NOTE — Progress Notes (Signed)
[place] 31+1 wks,cephalic,anterior placenta gr 3,afi 12.5 cm,fhr 150 bpm,EFW 1669 g 32%

## 2020-09-07 ENCOUNTER — Telehealth: Payer: Self-pay | Admitting: Lactation Services

## 2020-09-07 MED ORDER — CYCLOBENZAPRINE HCL 10 MG PO TABS: 10.0000 mg | ORAL_TABLET | Freq: Three times a day (TID) | ORAL | 1 refills | Status: DC | PRN

## 2020-09-07 NOTE — Telephone Encounter (Signed)
Spoke with Dr. Shawnie Pons and she reports if patient has a history we may be able to treat her, otherwise she will need to got to MAU for evaluation.   Called patient, patient reports she has a history of Migraines with Cycle when she was younger. She has not had them since.   Asked patient to take her blood pressure. BP was 120/72 HR 82.   Patient denies blurred vision, dizziness, rib pain and no new swelling. Patient reports nausea also.   She is congested per patient, it has improved. Recommended Covid testing, Gave patient website and phone # for Covid testing. [email or web address] or [phone].   Flexeril ordered per Dr. Shawnie Pons. Reviewed with patient that if she continues to have the severe headache and is not improving, to go to MAU for evaluation. Patient voiced understanding.

## 2020-09-13 ENCOUNTER — Ambulatory Visit (INDEPENDENT_AMBULATORY_CARE_PROVIDER_SITE_OTHER): Payer: BC Managed Care – PPO | Admitting: Obstetrics & Gynecology

## 2020-09-13 ENCOUNTER — Other Ambulatory Visit: Payer: Self-pay

## 2020-09-13 ENCOUNTER — Encounter: Payer: Self-pay | Admitting: Obstetrics & Gynecology

## 2020-09-13 VITALS — BP 108/62 | HR 77 | Wt 159.0 lb

## 2020-09-13 DIAGNOSIS — Z3403 Encounter for supervision of normal first pregnancy, third trimester: Secondary | ICD-10-CM

## 2020-09-13 NOTE — Progress Notes (Signed)
   LOW-RISK PREGNANCY VISIT Patient name: Caitlyn Green MRN [id number]  Date of birth: 2001-01-25 Chief Complaint:   Routine Prenatal Visit  History of Present Illness:   Caitlyn Green is a 20 y.o. G27P0000 female at [redacted]w[redacted]d with an Estimated Date of Delivery: 11/07/20 being seen today for ongoing management of a low-risk pregnancy.  Depression screen [facility] 2/9 04/30/2020 03/13/2020  Decreased Interest 1 0  Down, Depressed, Hopeless 1 1  PHQ - 2 Score 2 1  Altered sleeping 3 1  Tired, decreased energy 1 1  Change in appetite 1 0  Feeling bad or failure about yourself  0 1  Trouble concentrating 0 0  Moving slowly or fidgety/restless 0 0  Suicidal thoughts 0 0  PHQ-9 Score 7 4  Difficult doing work/chores - Not difficult at all    Today she reports no complaints. Contractions: Not present. Vag. Bleeding: None.  Movement: Present. denies leaking of fluid. Review of Systems:   Pertinent items are noted in HPI Denies abnormal vaginal discharge w/ itching/odor/irritation, headaches, visual changes, shortness of breath, chest pain, abdominal pain, severe nausea/vomiting, or problems with urination or bowel movements unless otherwise stated above. Pertinent History Reviewed:  Reviewed past medical,surgical, social, obstetrical and family history.  Reviewed problem list, medications and allergies. Physical Assessment:   Vitals:   09/13/20 1521  BP: 108/62  Pulse: 77  Weight: 159 lb (72.1 kg)  Body mass index is 22.18 kg/m.        Physical Examination:   General appearance: Well appearing, and in no distress  Mental status: Alert, oriented to person, place, and time  Skin: Warm & dry  Cardiovascular: Normal heart rate noted  Respiratory: Normal respiratory effort, no distress  Abdomen: Soft, gravid, nontender  Pelvic: Cervical exam deferred         Extremities: Edema: None  Fetal Status: Fetal Heart Rate (bpm): 137 Fundal Height: 27 cm Movement: Present    Chaperone: n/a    No  results found for this or any previous visit (from the past 24 hour(s)).  Assessment & Plan:  1) Low-risk pregnancy G1P0000 at [redacted]w[redacted]d with an Estimated Date of Delivery: 11/07/20   2) measures small but EFW 3/3 was 32%, normal fluid   Meds: No orders of the defined types were placed in this encounter.  Labs/procedures today:   Plan:  Continue routine obstetrical care  Next visit: prefers in person    Reviewed: Preterm labor symptoms and general obstetric precautions including but not limited to vaginal bleeding, contractions, leaking of fluid and fetal movement were reviewed in detail with the patient.  All questions were answered. Has home bp cuff. Rx faxed to . Check bp weekly, let us know if >140/90.   Follow-up: Return in about 2 weeks (around 09/27/2020) for LROB.  No orders of the defined types were placed in this encounter.   Lazaro Arms, MD 09/13/2020 3:44 PM

## 2020-09-25 ENCOUNTER — Other Ambulatory Visit: Payer: Self-pay

## 2020-09-25 ENCOUNTER — Encounter (HOSPITAL_COMMUNITY): Payer: Self-pay | Admitting: Obstetrics and Gynecology

## 2020-09-25 ENCOUNTER — Telehealth: Payer: Self-pay

## 2020-09-25 ENCOUNTER — Inpatient Hospital Stay (HOSPITAL_COMMUNITY)
Admission: AD | Admit: 2020-09-25 | Discharge: 2020-09-25 | Disposition: A | Discharge status: Home / Self Care | Payer: BC Managed Care – PPO | Attending: Obstetrics and Gynecology | Admitting: Obstetrics and Gynecology

## 2020-09-25 DIAGNOSIS — O4700 False labor before 37 completed weeks of gestation, unspecified trimester: Secondary | ICD-10-CM

## 2020-09-25 DIAGNOSIS — O4703 False labor before 37 completed weeks of gestation, third trimester: Secondary | ICD-10-CM | POA: Diagnosis not present

## 2020-09-25 DIAGNOSIS — Z3403 Encounter for supervision of normal first pregnancy, third trimester: Secondary | ICD-10-CM

## 2020-09-25 DIAGNOSIS — Z7982 Long term (current) use of aspirin: Secondary | ICD-10-CM | POA: Diagnosis not present

## 2020-09-25 DIAGNOSIS — Z3A33 33 weeks gestation of pregnancy: Secondary | ICD-10-CM | POA: Insufficient documentation

## 2020-09-25 DIAGNOSIS — R109 Unspecified abdominal pain: Secondary | ICD-10-CM | POA: Diagnosis not present

## 2020-09-25 DIAGNOSIS — O26843 Uterine size-date discrepancy, third trimester: Secondary | ICD-10-CM

## 2020-09-25 DIAGNOSIS — O212 Late vomiting of pregnancy: Secondary | ICD-10-CM | POA: Diagnosis not present

## 2020-09-25 LAB — URINALYSIS, ROUTINE W REFLEX MICROSCOPIC
Bilirubin Urine: NEGATIVE
Glucose, UA: NEGATIVE mg/dL
Hgb urine dipstick: NEGATIVE
Ketones, ur: NEGATIVE mg/dL
Nitrite: NEGATIVE
Protein, ur: NEGATIVE mg/dL
Specific Gravity, Urine: 1.005 (ref 1.005–1.030)
pH: 6 (ref 5.0–8.0)

## 2020-09-25 LAB — WET PREP, GENITAL
Clue Cells Wet Prep HPF POC: NONE SEEN
Sperm: NONE SEEN
Trich, Wet Prep: NONE SEEN
Yeast Wet Prep HPF POC: NONE SEEN

## 2020-09-25 LAB — FETAL FIBRONECTIN: Fetal Fibronectin: NEGATIVE

## 2020-09-25 MED ORDER — LACTATED RINGERS IV BOLUS
1000.0000 mL | Freq: Once | INTRAVENOUS | Status: AC
  Administered 2020-09-25: 1000 mL via INTRAVENOUS

## 2020-09-25 NOTE — Discharge Instructions (Signed)
Please return to MAU for > 6 contractions in 1 hour

## 2020-09-25 NOTE — Telephone Encounter (Signed)
Pt called stating that she had some cramping beginning last night, some nausea this morning. She stated that the cramps weren't severe and she could still talk and move. She stated that she called out of work and slept off and on. When she slept, the cramps stopped. She noticed a mucous like discharge, but no bleeding or spotting. The cramps are intermittent and are not regular or increasing in frequency. The baby is moving well. The pt was instructed to drink more water, lay on her left side to help with the cramping. She was instructed to monitor for decreased baby movement, any change to her discharge, or contractions. She was able to laugh and speak with me and others in the background with ease. Pt instructed if anything changed as we discussed, she should seek immediate care. She has an appt this Fri, the 25th and if necessary, she could come in to be checked earlier. Pt confirmed understanding.

## 2020-09-25 NOTE — MAU Provider Note (Signed)
History     CSN: [id number]  Arrival date and time: 09/25/20 1645   Event Date/Time   First Provider Initiated Contact with Patient 09/25/20 1837      Chief Complaint  Patient presents with  . Abdominal Pain   [name] is a 20 y.o. year old G27P0000 female at [redacted]w[redacted]d weeks gestation who presents to MAU reporting abdominal cramping and nausea all day. She woke up with the cramping. When she woke up at 1521 the cramping is increased. She called her OB office and was told to come in. She reports good (+) FM. She denies VB or LOF. She receives [facility] with [facility]; next appt 09/28/20. Her grandmother is present and contributing to the history taking.    OB History    Gravida  1   Para  0   Term  0   Preterm  0   AB  0   Living  0     SAB  0   IAB  0   Ectopic  0   Multiple  0   Live Births  0           Past Medical History:  Diagnosis Date  . Back strain   . Rheumatoid arthritis [facility])     Past Surgical History:  Procedure Laterality Date  . NO PAST SURGERIES      Family History  Problem Relation Age of Onset  . Healthy Mother   . Cancer Father   . Arthritis Maternal Grandmother   . Healthy Sister   . Healthy Sister   . Healthy Brother     Social History   Tobacco Use  . Smoking status: Never Smoker  . Smokeless tobacco: Never Used  Vaping Use  . Vaping Use: Former  Substance Use Topics  . Alcohol use: No  . Drug use: No    Allergies: No Known Allergies  Medications Prior to Admission  Medication Sig Dispense Refill Last Dose  . Prenatal Vit-Iron Carbonyl-FA (PRENATAL PLUS IRON) 29-1 MG TABS Take 1 daily 30 tablet 12 Past Week at Unknown time  . aspirin 81 MG EC tablet Take 1 tablet (81 mg total) by mouth daily. Swallow whole. 30 tablet 8   . cyclobenzaprine (FLEXERIL) 10 MG tablet Take 1 tablet (10 mg total) by mouth every 8 (eight) hours as needed for muscle spasms. 30 tablet 1     Review of Systems  Constitutional: Negative.    HENT: Negative.   Eyes: Negative.   Respiratory: Negative.   Cardiovascular: Negative.   Gastrointestinal: Negative.   Endocrine: Negative.   Genitourinary: Positive for pelvic pain (cramping).  Musculoskeletal: Negative.   Skin: Negative.   Allergic/Immunologic: Negative.   Neurological: Negative.   Hematological: Negative.   Psychiatric/Behavioral: Negative.    Physical Exam   Blood pressure 126/74, pulse 72, temperature 98.1 F (36.7 C), temperature source Oral, resp. rate 17, height 5\' 11"  (1.803 m), weight 67.4 kg, last menstrual period 01/22/2020, SpO2 100 %.  Physical Exam Vitals and nursing note reviewed. Exam conducted with a chaperone present.  Constitutional:      Appearance: Normal appearance. She is normal weight.  Cardiovascular:     Rate and Rhythm: Normal rate.  Abdominal:     Palpations: Abdomen is soft.  Genitourinary:    Comments: Pelvic exam: External genitalia normal, SE: vaginal walls pink and well rugated, cervix is smooth, pink, no lesions, small amt of thin, white vaginal d/c -- WP, GC/CT done, Cervix: closed/thick/firm, Uterus  is non-tender, S=D, no CMT, (+) friability, no adnexal tenderness.  Musculoskeletal:        General: Normal range of motion.  Skin:    General: Skin is warm and dry.  Neurological:     Mental Status: She is alert and oriented to person, place, and time.  Psychiatric:        Mood and Affect: Mood normal.        Behavior: Behavior normal.        Thought Content: Thought content normal.        Judgment: Judgment normal.    REACTIVE NST - FHR: 140 bpm / moderate variability / accels present / decels absent / TOCO: irregular UC with UI noted  MAU Course  Procedures  MDM CCUA Wet Prep fFN GC/CT -- Results pending  LR 1000 ml bolus @ 999 ml/hr  Results for orders placed or performed during the hospital encounter of 09/25/20 (from the past 24 hour(s))  Urinalysis, Routine w reflex microscopic Urine, Clean Catch      Status: Abnormal   Collection Time: 09/25/20  5:10 PM  Result Value Ref Range   Color, Urine YELLOW YELLOW   APPearance CLEAR CLEAR   Specific Gravity, Urine 1.005 1.005 - 1.030   pH 6.0 5.0 - 8.0   Glucose, UA NEGATIVE NEGATIVE mg/dL   Hgb urine dipstick NEGATIVE NEGATIVE   Bilirubin Urine NEGATIVE NEGATIVE   Ketones, ur NEGATIVE NEGATIVE mg/dL   Protein, ur NEGATIVE NEGATIVE mg/dL   Nitrite NEGATIVE NEGATIVE   Leukocytes,Ua TRACE (A) NEGATIVE   RBC / HPF 0-5 0 - 5 RBC/hpf   WBC, UA 6-10 0 - 5 WBC/hpf   Bacteria, UA RARE (A) NONE SEEN   Squamous Epithelial / LPF 0-5 0 - 5   Mucus PRESENT   Fetal fibronectin     Status: None   Collection Time: 09/25/20  6:41 PM  Result Value Ref Range   Fetal Fibronectin NEGATIVE NEGATIVE  Wet prep, genital     Status: Abnormal   Collection Time: 09/25/20  6:53 PM   Specimen: Vaginal  Result Value Ref Range   Yeast Wet Prep HPF POC NONE SEEN NONE SEEN   Trich, Wet Prep NONE SEEN NONE SEEN   Clue Cells Wet Prep HPF POC NONE SEEN NONE SEEN   WBC, Wet Prep HPF POC MODERATE (A) NONE SEEN   Sperm NONE SEEN     Assessment and Plan  Preterm uterine contractions - Plan: Discharge patient - Irregular contractions  - Advised to return to MAU for >6 UC's in 1 hour - Information provided on preventing preterm birth - Keep scheduled appointment with [facility] on 09/28/2020 - Patient verbalized an understanding of the plan of care and agrees.   Raelyn Mora, CNM 09/25/2020, 6:55 PM

## 2020-09-25 NOTE — MAU Note (Signed)
Been experiencing cramping and nausea all day.  Cramps woke her up.  Went back to sleep.  Woke her at 1521, increase in cramping.  Called Dr, was told if they got closer to come in.  Baby is moving well.  No bleeding or leaking. Denies diarrhea or constipation, no vomiting.

## 2020-09-26 LAB — GC/CHLAMYDIA PROBE AMP (~~LOC~~) NOT AT ARMC
Chlamydia: NEGATIVE
Comment: NEGATIVE
Comment: NORMAL
Neisseria Gonorrhea: NEGATIVE

## 2020-09-28 ENCOUNTER — Ambulatory Visit (INDEPENDENT_AMBULATORY_CARE_PROVIDER_SITE_OTHER): Payer: BC Managed Care – PPO | Admitting: Advanced Practice Midwife

## 2020-09-28 ENCOUNTER — Encounter: Payer: Self-pay | Admitting: Advanced Practice Midwife

## 2020-09-28 ENCOUNTER — Other Ambulatory Visit: Payer: Self-pay

## 2020-09-28 VITALS — BP 126/80 | HR 76 | Wt 150.0 lb

## 2020-09-28 DIAGNOSIS — Z3403 Encounter for supervision of normal first pregnancy, third trimester: Secondary | ICD-10-CM | POA: Diagnosis not present

## 2020-09-28 DIAGNOSIS — Z3A34 34 weeks gestation of pregnancy: Secondary | ICD-10-CM | POA: Diagnosis not present

## 2020-09-28 DIAGNOSIS — Z23 Encounter for immunization: Secondary | ICD-10-CM

## 2020-09-28 NOTE — Progress Notes (Signed)
   LOW-RISK PREGNANCY VISIT Patient name: Caitlyn Green MRN [id number]  Date of birth: 2001-03-10 Chief Complaint:   Routine Prenatal Visit (Abdominal discomfort)  History of Present Illness:   Caitlyn Green is a 20 y.o. G74P0000 female at 101w2d with an Estimated Date of Delivery: 11/07/20 being seen today for ongoing management of a low-risk pregnancy.  Today she reports having MAU visit 3/22 for abd cramping but feeling better now; cx was C/L; no longer interested in waterbirth/hydrotherapy; still trying to reach mobile service for FOB alpha thal screening. Contractions: Not present. Vag. Bleeding: None.  Movement: Present. denies leaking of fluid. Review of Systems:   Pertinent items are noted in HPI Denies abnormal vaginal discharge w/ itching/odor/irritation, headaches, visual changes, shortness of breath, chest pain, abdominal pain, severe nausea/vomiting, or problems with urination or bowel movements unless otherwise stated above. Pertinent History Reviewed:  Reviewed past medical,surgical, social, obstetrical and family history.  Reviewed problem list, medications and allergies. Physical Assessment:   Vitals:   09/28/20 0845  BP: 126/80  Pulse: 76  Weight: 150 lb (68 kg)  Body mass index is 20.92 kg/m.        Physical Examination:   General appearance: Well appearing, and in no distress  Mental status: Alert, oriented to person, place, and time  Skin: Warm & dry  Cardiovascular: Normal heart rate noted  Respiratory: Normal respiratory effort, no distress  Abdomen: Soft, gravid, nontender  Pelvic: Cervical exam deferred         Extremities: Edema: None  Fetal Status: Fetal Heart Rate (bpm): 161 Fundal Height: 27 cm Movement: Present    No results found for this or any previous visit (from the past 24 hour(s)).  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  Labs/procedures today: Tdap  Plan:  Continue routine obstetrical care   Reviewed: Preterm labor symptoms and general obstetric precautions including but not limited to vaginal bleeding, contractions, leaking of fluid and fetal movement were reviewed in detail with the patient.  All questions were answered. Has home bp cuff. Check bp weekly, let us know if >140/90.   Follow-up: Return in about 2 weeks (around 10/12/2020) for LROB, in person; GBS/cultures.  Orders Placed This Encounter  Procedures  . Tdap vaccine greater than or equal to 7yo IM   Arabella Merles [facility] 09/28/2020 9:06 AM

## 2020-09-28 NOTE — Patient Instructions (Signed)
Caitlyn Green, I greatly value your feedback.  If you receive a survey following your visit with us today, we appreciate you taking the time to fill it out.  Thanks, [name], CNM   [facility] ([address] [place], [place] [address]) Entrance C, located off of [address] Free 24/7 valet parking  Go to [email or web address] to register for FREE online childbirth classes   Call the office ([phone]) or go to [facility] if:  You begin to have strong, frequent contractions  Your water breaks.  Sometimes it is a big gush of fluid, sometimes it is just a trickle that keeps getting your panties wet or running down your legs  You have vaginal bleeding.  It is normal to have a small amount of spotting if your cervix was checked.   You don't feel your baby moving like normal.  If you don't, get you something to eat and drink and lay down and focus on feeling your baby move.  You should feel at least 10 movements in 2 hours.  If you don't, you should call the office or go to [facility].    Tdap Vaccine  It is recommended that you get the Tdap vaccine during the third trimester of EACH pregnancy to help protect your baby from getting pertussis (whooping cough)  27-36 weeks is the BEST time to do this so that you can pass the protection on to your baby. During pregnancy is better than after pregnancy, but if you are unable to get it during pregnancy it will be offered at the hospital.   You can get this vaccine with us, at the health department, your family doctor, or some local pharmacies  Everyone who will be around your baby should also be up-to-date on their vaccines before the baby comes. Adults (who are not pregnant) only need 1 dose of Tdap during adulthood.   [place] Pediatricians/Family Doctors:  [facility] [phone]            [facility] [phone]                 [facility] [phone]  (usually not accepting new patients unless you have family there already, you are always welcome to call and ask)       [facility] [phone]       [place] Pediatricians/Family Doctors:   [facility]: [phone]  [facility]: [phone]  [facility]: [phone]  [place] Family Doctors:   [facility]: [phone]   [facility]: [phone]  [place] Family Doctors:  [facility]: [phone]   Home Blood Pressure Monitoring for Patients   Your provider has recommended that you check your blood pressure (BP) at least once a week at home. If you do not have a blood pressure cuff at home, one will be provided for you. Contact your provider if you have not received your monitor within 1 week.   Helpful Tips for Accurate Home Blood Pressure Checks  . Don't smoke, exercise, or drink caffeine 30 minutes before checking your BP . Use the restroom before checking your BP (a full bladder can raise your pressure) . Relax in a comfortable upright chair . Feet on the ground . Left arm resting comfortably on a flat surface at the level of your heart . Legs uncrossed . Back supported . Sit quietly and don't talk . Place the cuff on your bare arm .   Adjust snuggly, so that only two fingertips can fit between your skin and the top of the cuff . Check 2 readings separated by at least one minute . Keep a log of your BP readings . For a visual, please reference this diagram: http://ccnc.care/bpdiagram  Provider Name: [facility]     Phone: [phone]  Zone 1: ALL CLEAR  Continue to monitor your symptoms:  . BP reading is less than 140 (top number) or less than 90 (bottom number)  . No right upper stomach pain . No headaches or seeing spots . No feeling nauseated or throwing up . No swelling in face and hands  Zone 2: CAUTION Call your doctor's office for  any of the following:  . BP reading is greater than 140 (top number) or greater than 90 (bottom number)  . Stomach pain under your ribs in the middle or right side . Headaches or seeing spots . Feeling nauseated or throwing up . Swelling in face and hands  Zone 3: EMERGENCY  Seek immediate medical care if you have any of the following:  . BP reading is greater than160 (top number) or greater than 110 (bottom number) . Severe headaches not improving with Tylenol . Serious difficulty catching your breath . Any worsening symptoms from Zone 2   Third Trimester of Pregnancy The third trimester is from week 29 through week 42, months 7 through 9. The third trimester is a time when the fetus is growing rapidly. At the end of the ninth month, the fetus is about 20 inches in length and weighs 6-10 pounds.  BODY CHANGES Your body goes through many changes during pregnancy. The changes vary from woman to woman.   Your weight will continue to increase. You can expect to gain 25-35 pounds (11-16 kg) by the end of the pregnancy.  You may begin to get stretch marks on your hips, abdomen, and breasts.  You may urinate more often because the fetus is moving lower into your pelvis and pressing on your bladder.  You may develop or continue to have heartburn as a result of your pregnancy.  You may develop constipation because certain hormones are causing the muscles that push waste through your intestines to slow down.  You may develop hemorrhoids or swollen, bulging veins (varicose veins).  You may have pelvic pain because of the weight gain and pregnancy hormones relaxing your joints between the bones in your pelvis. Backaches may result from overexertion of the muscles supporting your posture.  You may have changes in your hair. These can include thickening of your hair, rapid growth, and changes in texture. Some women also have hair loss during or after pregnancy, or hair that feels dry or thin.  Your hair will most likely return to normal after your baby is born.  Your breasts will continue to grow and be tender. A yellow discharge may leak from your breasts called colostrum.  Your belly button may stick out.  You may feel short of breath because of your expanding uterus.  You may notice the fetus "dropping," or moving lower in your abdomen.  You may have a bloody mucus discharge. This usually occurs a few days to a week before labor begins.  Your cervix becomes thin and soft (effaced) near your due date. WHAT TO EXPECT AT YOUR PRENATAL EXAMS  You will have prenatal exams every 2 weeks until week 36. Then, you will have weekly prenatal exams. During a routine prenatal visit:  You will be  weighed to make sure you and the fetus are growing normally.  Your blood pressure is taken.  Your abdomen will be measured to track your baby's growth.  The fetal heartbeat will be listened to.  Any test results from the previous visit will be discussed.  You may have a cervical check near your due date to see if you have effaced. At around 36 weeks, your caregiver will check your cervix. At the same time, your caregiver will also perform a test on the secretions of the vaginal tissue. This test is to determine if a type of bacteria, Group B streptococcus, is present. Your caregiver will explain this further. Your caregiver may ask you:  What your birth plan is.  How you are feeling.  If you are feeling the baby move.  If you have had any abnormal symptoms, such as leaking fluid, bleeding, severe headaches, or abdominal cramping.  If you have any questions. Other tests or screenings that may be performed during your third trimester include:  Blood tests that check for low iron levels (anemia).  Fetal testing to check the health, activity level, and growth of the fetus. Testing is done if you have certain medical conditions or if there are problems during the pregnancy. FALSE  LABOR You may feel small, irregular contractions that eventually go away. These are called Braxton Hicks contractions, or false labor. Contractions may last for hours, days, or even weeks before true labor sets in. If contractions come at regular intervals, intensify, or become painful, it is best to be seen by your caregiver.  SIGNS OF LABOR   Menstrual-like cramps.  Contractions that are 5 minutes apart or less.  Contractions that start on the top of the uterus and spread down to the lower abdomen and back.  A sense of increased pelvic pressure or back pain.  A watery or bloody mucus discharge that comes from the vagina. If you have any of these signs before the 37th week of pregnancy, call your caregiver right away. You need to go to the hospital to get checked immediately. HOME CARE INSTRUCTIONS   Avoid all smoking, herbs, alcohol, and unprescribed drugs. These chemicals affect the formation and growth of the baby.  Follow your caregiver's instructions regarding medicine use. There are medicines that are either safe or unsafe to take during pregnancy.  Exercise only as directed by your caregiver. Experiencing uterine cramps is a good sign to stop exercising.  Continue to eat regular, healthy meals.  Wear a good support bra for breast tenderness.  Do not use hot tubs, steam rooms, or saunas.  Wear your seat belt at all times when driving.  Avoid raw meat, uncooked cheese, cat litter boxes, and soil used by cats. These carry germs that can cause birth defects in the baby.  Take your prenatal vitamins.  Try taking a stool softener (if your caregiver approves) if you develop constipation. Eat more high-fiber foods, such as fresh vegetables or fruit and whole grains. Drink plenty of fluids to keep your urine clear or pale yellow.  Take warm sitz baths to soothe any pain or discomfort caused by hemorrhoids. Use hemorrhoid cream if your caregiver approves.  If you develop varicose  veins, wear support hose. Elevate your feet for 15 minutes, 3-4 times a day. Limit salt in your diet.  Avoid heavy lifting, wear low heal shoes, and practice good posture.  Rest a lot with your legs elevated if you have leg cramps or low back pain.  Visit your dentist if you have not gone during your pregnancy. Use a soft toothbrush to brush your teeth and be gentle when you floss.  A sexual relationship may be continued unless your caregiver directs you otherwise.  Do not travel far distances unless it is absolutely necessary and only with the approval of your caregiver.  Take prenatal classes to understand, practice, and ask questions about the labor and delivery.  Make a trial run to the hospital.  Pack your hospital bag.  Prepare the baby's nursery.  Continue to go to all your prenatal visits as directed by your caregiver. SEEK MEDICAL CARE IF:  You are unsure if you are in labor or if your water has broken.  You have dizziness.  You have mild pelvic cramps, pelvic pressure, or nagging pain in your abdominal area.  You have persistent nausea, vomiting, or diarrhea.  You have a bad smelling vaginal discharge.  You have pain with urination. SEEK IMMEDIATE MEDICAL CARE IF:   You have a fever.  You are leaking fluid from your vagina.  You have spotting or bleeding from your vagina.  You have severe abdominal cramping or pain.  You have rapid weight loss or gain.  You have shortness of breath with chest pain.  You notice sudden or extreme swelling of your face, hands, ankles, feet, or legs.  You have not felt your baby move in over an hour.  You have severe headaches that do not go away with medicine.  You have vision changes. Document Released: 06/17/2001 Document Revised: 06/28/2013 Document Reviewed: 08/24/2012 [facility] Patient Information 2015 [place], [place]. This information is not intended to replace advice given to you by your health care provider. Make  sure you discuss any questions you have with your health care provider.

## 2020-10-12 ENCOUNTER — Ambulatory Visit (INDEPENDENT_AMBULATORY_CARE_PROVIDER_SITE_OTHER): Payer: BC Managed Care – PPO | Admitting: Advanced Practice Midwife

## 2020-10-12 ENCOUNTER — Encounter: Payer: Self-pay | Admitting: Advanced Practice Midwife

## 2020-10-12 ENCOUNTER — Other Ambulatory Visit: Payer: Self-pay

## 2020-10-12 VITALS — BP 124/81 | HR 72 | Wt 147.5 lb

## 2020-10-12 DIAGNOSIS — Z3A36 36 weeks gestation of pregnancy: Secondary | ICD-10-CM

## 2020-10-12 DIAGNOSIS — O26843 Uterine size-date discrepancy, third trimester: Secondary | ICD-10-CM

## 2020-10-12 DIAGNOSIS — Z3403 Encounter for supervision of normal first pregnancy, third trimester: Secondary | ICD-10-CM | POA: Diagnosis not present

## 2020-10-12 NOTE — Progress Notes (Signed)
   LOW-RISK PREGNANCY VISIT Patient name: Caitlyn Green MRN [id number]  Date of birth: [birth year] Chief Complaint:   Routine Prenatal Visit (GBS today; pt had negative GC/CHL on 3/22; heartburn, nausea, decreased appetite)  History of Present Illness:   Caitlyn Green is a 20 y.o. G48P0000 female at [redacted]w[redacted]d with an Estimated Date of Delivery: 11/07/20 being seen today for ongoing management of a low-risk pregnancy.  Today she reports a decreased appetite x the past week, +nausea. Contractions: Irregular. Vag. Bleeding: None.  Movement: Present. denies leaking of fluid. Review of Systems:   Pertinent items are noted in HPI Denies abnormal vaginal discharge w/ itching/odor/irritation, headaches, visual changes, shortness of breath, chest pain, abdominal pain, severe nausea/vomiting, or problems with urination or bowel movements unless otherwise stated above. Pertinent History Reviewed:  Reviewed past medical,surgical, social, obstetrical and family history.  Reviewed problem list, medications and allergies. Physical Assessment:   Vitals:   10/12/20 0927  BP: 124/81  Pulse: 72  Weight: 147 lb 8 oz (66.9 kg)  Body mass index is 20.57 kg/m.        Physical Examination:   General appearance: Well appearing, and in no distress  Mental status: Alert, oriented to person, place, and time  Skin: Warm & dry  Cardiovascular: Normal heart rate noted  Respiratory: Normal respiratory effort, no distress  Abdomen: Soft, gravid, nontender  Pelvic: Cervical exam performed  Dilation: Closed Effacement (%): Thick Station: -3  Extremities: Edema: None  Fetal Status: Fetal Heart Rate (bpm): 141 Fundal Height: 30 cm Movement: Present Presentation: Vertex  No results found for this or any previous visit (from the past 24 hour(s)).  Assessment & Plan:  1) Low-risk pregnancy G1P0000 at [redacted]w[redacted]d with an Estimated Date of Delivery: 11/07/20   2) Decreased appetite x 1wk, weight loss of 2.5lb with a total gain of  13lbs; encouraged calorie-dense foods  3) S<D with EFW 32% at 32wks, will repeat with next visit as is measuring 6cm<dating   Meds: No orders of the defined types were placed in this encounter.  Labs/procedures today: GBS only (had neg GC/chlam on 3/22)   Plan:  Continue routine obstetrical care   Reviewed: Term labor symptoms and general obstetric precautions including but not limited to vaginal bleeding, contractions, leaking of fluid and fetal movement were reviewed in detail with the patient.  All questions were answered. Has home bp cuff. Check bp weekly, let us know if >140/90.   Follow-up: Return in about 1 week (around 10/19/2020) for LROB, in person, [place]: EFW.  Orders Placed This Encounter  Procedures  . Strep Gp B NAA  . US OB Follow Up   Arabella Merles [facility] 10/12/2020 9:45 AM

## 2020-10-12 NOTE — Patient Instructions (Signed)
Caitlyn Green, I greatly value your feedback.  If you receive a survey following your visit with [place] today, we appreciate you taking the time to fill it out.  Thanks, Caitlyn Green, CNM   [facility] ([address] [place], [place] [address]) Entrance C, located off of [address] valet parking  Go to [email or web address] to register for FREE online childbirth classes   Call the office [phone]) or go to [facility] if:  You begin to have strong, frequent contractions  Your water breaks.  Sometimes it is a big gush of fluid, sometimes it is just a trickle that keeps getting your panties wet or running down your legs  You have vaginal bleeding.  It is normal to have a small amount of spotting if your cervix was checked.   You don't feel your baby moving like normal.  If you don't, get you something to eat and drink and lay down and focus on feeling your baby move.  You should feel at least 10 movements in 2 hours.  If you don't, you should call the office or go to [facility].    Tdap Vaccine  It is recommended that you get the Tdap vaccine during the third trimester of EACH pregnancy to help protect your baby from getting pertussis (whooping cough)  27-36 weeks is the BEST time to do this so that you can pass the protection on to your baby. During pregnancy is better than after pregnancy, but if you are unable to get it during pregnancy it will be offered at the hospital.   You can get this vaccine with [place], at the health department, your family doctor, or some local pharmacies  Everyone who will be around your baby should also be up-to-date on their vaccines before the baby comes. Adults (who are not pregnant) only need 1 dose of Tdap during adulthood.   [place] Pediatricians/Family Doctors:  [facility] [phone]            [facility] [phone]                 [facility] [phone]  (usually not accepting new patients unless you have family there already, you are always welcome to call and ask)       [facility] [phone]       [facility] Pediatricians/Family Doctors:   [facility]: [phone]  [facility]: [phone]  [facility]: [phone]  [facility] Doctors:   [facility]: [phone]   [facility]: [phone]  [facility] Doctors:  [facility]: [phone]   Home Blood Pressure Monitoring for Patients   Your provider has recommended that you check your blood pressure (BP) at least once a week at home. If you do not have a blood pressure cuff at home, one will be provided for you. Contact your provider if you have not received your monitor within 1 week.   Helpful Tips for Accurate Home Blood Pressure Checks  . Don't smoke, exercise, or drink caffeine 30 minutes before checking your BP . Use the restroom before checking your BP (a full bladder can raise your pressure) . Relax in a comfortable upright chair . Feet on the ground . Left arm resting comfortably on a flat surface at the level of your heart . Legs uncrossed . Back supported . Sit quietly and don't talk . Place the cuff on your bare arm .  Adjust snuggly, so that only two fingertips can fit between your skin and the top of the cuff . Check 2 readings separated by at least one minute . Keep a log of your BP readings . For a visual, please reference this diagram: http://ccnc.care/bpdiagram  Provider Name: [facility]     Phone: [phone]  Zone 1: ALL CLEAR  Continue to monitor your symptoms:  . BP reading is less than 140 (top number) or less than 90 (bottom number)  . No right upper stomach pain . No headaches or seeing spots . No feeling nauseated or throwing up . No swelling in face and hands  Zone 2: CAUTION Call your doctor's office for  any of the following:  . BP reading is greater than 140 (top number) or greater than 90 (bottom number)  . Stomach pain under your ribs in the middle or right side . Headaches or seeing spots . Feeling nauseated or throwing up . Swelling in face and hands  Zone 3: EMERGENCY  Seek immediate medical care if you have any of the following:  . BP reading is greater than160 (top number) or greater than 110 (bottom number) . Severe headaches not improving with Tylenol . Serious difficulty catching your breath . Any worsening symptoms from Zone 2   Third Trimester of Pregnancy The third trimester is from week 29 through week 42, months 7 through 9. The third trimester is a time when the fetus is growing rapidly. At the end of the ninth month, the fetus is about 20 inches in length and weighs 6-10 pounds.  BODY CHANGES Your body goes through many changes during pregnancy. The changes vary from woman to woman.   Your weight will continue to increase. You can expect to gain 25-35 pounds (11-16 kg) by the end of the pregnancy.  You may begin to get stretch marks on your hips, abdomen, and breasts.  You may urinate more often because the fetus is moving lower into your pelvis and pressing on your bladder.  You may develop or continue to have heartburn as a result of your pregnancy.  You may develop constipation because certain hormones are causing the muscles that push waste through your intestines to slow down.  You may develop hemorrhoids or swollen, bulging veins (varicose veins).  You may have pelvic pain because of the weight gain and pregnancy hormones relaxing your joints between the bones in your pelvis. Backaches may result from overexertion of the muscles supporting your posture.  You may have changes in your hair. These can include thickening of your hair, rapid growth, and changes in texture. Some women also have hair loss during or after pregnancy, or hair that feels dry or thin.  Your hair will most likely return to normal after your baby is born.  Your breasts will continue to grow and be tender. A yellow discharge may leak from your breasts called colostrum.  Your belly button may stick out.  You may feel short of breath because of your expanding uterus.  You may notice the fetus "dropping," or moving lower in your abdomen.  You may have a bloody mucus discharge. This usually occurs a few days to a week before labor begins.  Your cervix becomes thin and soft (effaced) near your due date. WHAT TO EXPECT AT YOUR PRENATAL EXAMS  You will have prenatal exams every 2 weeks until week 36. Then, you will have weekly prenatal exams. During a routine prenatal visit:  You will be  weighed to make sure you and the fetus are growing normally.  Your blood pressure is taken.  Your abdomen will be measured to track your baby's growth.  The fetal heartbeat will be listened to.  Any test results from the previous visit will be discussed.  You may have a cervical check near your due date to see if you have effaced. At around 36 weeks, your caregiver will check your cervix. At the same time, your caregiver will also perform a test on the secretions of the vaginal tissue. This test is to determine if a type of bacteria, Group B streptococcus, is present. Your caregiver will explain this further. Your caregiver may ask you:  What your birth plan is.  How you are feeling.  If you are feeling the baby move.  If you have had any abnormal symptoms, such as leaking fluid, bleeding, severe headaches, or abdominal cramping.  If you have any questions. Other tests or screenings that may be performed during your third trimester include:  Blood tests that check for low iron levels (anemia).  Fetal testing to check the health, activity level, and growth of the fetus. Testing is done if you have certain medical conditions or if there are problems during the pregnancy. FALSE  LABOR You may feel small, irregular contractions that eventually go away. These are called Braxton Hicks contractions, or false labor. Contractions may last for hours, days, or even weeks before true labor sets in. If contractions come at regular intervals, intensify, or become painful, it is best to be seen by your caregiver.  SIGNS OF LABOR   Menstrual-like cramps.  Contractions that are 5 minutes apart or less.  Contractions that start on the top of the uterus and spread down to the lower abdomen and back.  A sense of increased pelvic pressure or back pain.  A watery or bloody mucus discharge that comes from the vagina. If you have any of these signs before the 37th week of pregnancy, call your caregiver right away. You need to go to the hospital to get checked immediately. HOME CARE INSTRUCTIONS   Avoid all smoking, herbs, alcohol, and unprescribed drugs. These chemicals affect the formation and growth of the baby.  Follow your caregiver's instructions regarding medicine use. There are medicines that are either safe or unsafe to take during pregnancy.  Exercise only as directed by your caregiver. Experiencing uterine cramps is a good sign to stop exercising.  Continue to eat regular, healthy meals.  Wear a good support bra for breast tenderness.  Do not use hot tubs, steam rooms, or saunas.  Wear your seat belt at all times when driving.  Avoid raw meat, uncooked cheese, cat litter boxes, and soil used by cats. These carry germs that can cause birth defects in the baby.  Take your prenatal vitamins.  Try taking a stool softener (if your caregiver approves) if you develop constipation. Eat more high-fiber foods, such as fresh vegetables or fruit and whole grains. Drink plenty of fluids to keep your urine clear or pale yellow.  Take warm sitz baths to soothe any pain or discomfort caused by hemorrhoids. Use hemorrhoid cream if your caregiver approves.  If you develop varicose  veins, wear support hose. Elevate your feet for 15 minutes, 3-4 times a day. Limit salt in your diet.  Avoid heavy lifting, wear low heal shoes, and practice good posture.  Rest a lot with your legs elevated if you have leg cramps or low back pain.  Visit your dentist if you have not gone during your pregnancy. Use a soft toothbrush to brush your teeth and be gentle when you floss.  A sexual relationship may be continued unless your caregiver directs you otherwise.  Do not travel far distances unless it is absolutely necessary and only with the approval of your caregiver.  Take prenatal classes to understand, practice, and ask questions about the labor and delivery.  Make a trial run to the hospital.  Pack your hospital bag.  Prepare the baby's nursery.  Continue to go to all your prenatal visits as directed by your caregiver. SEEK MEDICAL CARE IF:  You are unsure if you are in labor or if your water has broken.  You have dizziness.  You have mild pelvic cramps, pelvic pressure, or nagging pain in your abdominal area.  You have persistent nausea, vomiting, or diarrhea.  You have a bad smelling vaginal discharge.  You have pain with urination. SEEK IMMEDIATE MEDICAL CARE IF:   You have a fever.  You are leaking fluid from your vagina.  You have spotting or bleeding from your vagina.  You have severe abdominal cramping or pain.  You have rapid weight loss or gain.  You have shortness of breath with chest pain.  You notice sudden or extreme swelling of your face, hands, ankles, feet, or legs.  You have not felt your baby move in over an hour.  You have severe headaches that do not go away with medicine.  You have vision changes. Document Released: 06/17/2001 Document Revised: 06/28/2013 Document Reviewed: 08/24/2012 [facility] Patient Information 2015 [place], [place]. This information is not intended to replace advice given to you by your health care provider. Make  sure you discuss any questions you have with your health care provider.

## 2020-10-14 LAB — STREP GP B NAA: Strep Gp B NAA: NEGATIVE

## 2020-10-18 ENCOUNTER — Ambulatory Visit (INDEPENDENT_AMBULATORY_CARE_PROVIDER_SITE_OTHER): Payer: BC Managed Care – PPO

## 2020-10-18 ENCOUNTER — Ambulatory Visit (INDEPENDENT_AMBULATORY_CARE_PROVIDER_SITE_OTHER): Payer: BC Managed Care – PPO | Admitting: Advanced Practice Midwife

## 2020-10-18 ENCOUNTER — Other Ambulatory Visit: Payer: Self-pay

## 2020-10-18 ENCOUNTER — Other Ambulatory Visit: Payer: Self-pay | Admitting: Advanced Practice Midwife

## 2020-10-18 VITALS — BP 98/63 | HR 66 | Wt 150.5 lb

## 2020-10-18 DIAGNOSIS — Z3403 Encounter for supervision of normal first pregnancy, third trimester: Secondary | ICD-10-CM

## 2020-10-18 DIAGNOSIS — O0993 Supervision of high risk pregnancy, unspecified, third trimester: Secondary | ICD-10-CM

## 2020-10-18 DIAGNOSIS — O26843 Uterine size-date discrepancy, third trimester: Secondary | ICD-10-CM

## 2020-10-18 DIAGNOSIS — O365930 Maternal care for other known or suspected poor fetal growth, third trimester, not applicable or unspecified: Secondary | ICD-10-CM

## 2020-10-18 NOTE — Progress Notes (Signed)
[place] 37+1 wks,cephalic,anterior placenta gr 3,fhr 169 bpm,RI .64,.61,.59,.57=64%,BPP 8/8,EFW 2534 9.8%,AC 8.7%,AFI 9.3%,discussed results w/Fran

## 2020-10-18 NOTE — Progress Notes (Signed)
   HIGH-RISK PREGNANCY VISIT Patient name: Caitlyn Green MRN [id number]  Date of birth: 2000-09-16 Chief Complaint:   Routine Prenatal Visit  History of Present Illness:   Caitlyn Green is a 20 y.o. G64P0000 female at [redacted]w[redacted]d with an Estimated Date of Delivery: 11/07/20 being seen today for ongoing management of a high-risk pregnancy complicated by IUGR, found today Today she reports no complaints. Contractions: Irregular. Vag. Bleeding: None.  Movement: Present. denies leaking of fluid.  Review of Systems:   Pertinent items are noted in HPI Denies abnormal vaginal discharge w/ itching/odor/irritation, headaches, visual changes, shortness of breath, chest pain, abdominal pain, severe nausea/vomiting, or problems with urination or bowel movements unless otherwise stated above. Pertinent History Reviewed:  Reviewed past medical,surgical, social, obstetrical and family history.  Reviewed problem list, medications and allergies. Physical Assessment:   Vitals:   10/18/20 1648  BP: 98/63  Pulse: 66  Weight: 150 lb 8 oz (68.3 kg)  Body mass index is 20.99 kg/m.           Physical Examination:   General appearance: alert, well appearing, and in no distress  Mental status: alert, oriented to person, place, and time  Skin: warm & dry   Extremities: Edema: None    Cardiovascular: normal heart rate noted  Respiratory: normal respiratory effort, no distress  Abdomen: gravid, soft, non-tender  Pelvic: Cervical exam performed  Dilation: Closed Effacement (%): 50 Station: -2  Fetal Status:     Movement: Present Presentation: Vertex  Fetal Surveillance Testing today:   EFW 9.8%, AFI 9.3cm, RI.64  BPP 8/8  Discussed w/Dr. Vergie Living who consulted MFM: recommends delivery at 38 weeks No results found for this or any previous visit (from the past 24 hour(s)).  Assessment & Plan:  1) High-risk pregnancy G1P0000 at [redacted]w[redacted]d with an Estimated Date of Delivery: 11/07/20   2) IUGR,  Treatment plan  Dr .Vergie Living  spoke with Dr. Grace Bushy who recommends BPP Monday or Tuesday and plan IOL 38 weeks    Meds: No orders of the defined types were placed in this encounter.    Reviewed: Term labor symptoms and general obstetric precautions including but not limited to vaginal bleeding, contractions, leaking of fluid and fetal movement were reviewed in detail with the patient.  All questions were answered. Has home bp cuff.. Check bp weekly, let us know if >140/90.   Follow-up: Return for mon or tues for BPP/hrob.  No future appointments.  No orders of the defined types were placed in this encounter.  Jacklyn Shell DNP, CNM 10/18/2020 5:25 PM

## 2020-10-21 ENCOUNTER — Other Ambulatory Visit: Payer: Self-pay

## 2020-10-21 ENCOUNTER — Encounter (HOSPITAL_COMMUNITY): Payer: Self-pay | Admitting: Obstetrics & Gynecology

## 2020-10-21 ENCOUNTER — Inpatient Hospital Stay (EMERGENCY_DEPARTMENT_HOSPITAL)
Admission: AD | Admit: 2020-10-21 | Discharge: 2020-10-22 | Disposition: A | Discharge status: Home / Self Care | Payer: BC Managed Care – PPO | Source: Home / Self Care | Attending: Obstetrics & Gynecology | Admitting: Obstetrics & Gynecology

## 2020-10-21 DIAGNOSIS — O99891 Other specified diseases and conditions complicating pregnancy: Secondary | ICD-10-CM

## 2020-10-21 DIAGNOSIS — O9081 Anemia of the puerperium: Secondary | ICD-10-CM | POA: Diagnosis not present

## 2020-10-21 DIAGNOSIS — O99353 Diseases of the nervous system complicating pregnancy, third trimester: Secondary | ICD-10-CM | POA: Diagnosis not present

## 2020-10-21 DIAGNOSIS — O365931 Maternal care for other known or suspected poor fetal growth, third trimester, fetus 1: Secondary | ICD-10-CM | POA: Diagnosis not present

## 2020-10-21 DIAGNOSIS — Z3A37 37 weeks gestation of pregnancy: Secondary | ICD-10-CM | POA: Insufficient documentation

## 2020-10-21 DIAGNOSIS — O365930 Maternal care for other known or suspected poor fetal growth, third trimester, not applicable or unspecified: Secondary | ICD-10-CM | POA: Diagnosis not present

## 2020-10-21 DIAGNOSIS — G43B0 Ophthalmoplegic migraine, not intractable: Secondary | ICD-10-CM | POA: Diagnosis not present

## 2020-10-21 DIAGNOSIS — Z3A38 38 weeks gestation of pregnancy: Secondary | ICD-10-CM | POA: Diagnosis not present

## 2020-10-21 DIAGNOSIS — M549 Dorsalgia, unspecified: Secondary | ICD-10-CM

## 2020-10-21 DIAGNOSIS — O4292 Full-term premature rupture of membranes, unspecified as to length of time between rupture and onset of labor: Secondary | ICD-10-CM | POA: Diagnosis not present

## 2020-10-21 DIAGNOSIS — O471 False labor at or after 37 completed weeks of gestation: Secondary | ICD-10-CM | POA: Diagnosis not present

## 2020-10-21 DIAGNOSIS — O26893 Other specified pregnancy related conditions, third trimester: Secondary | ICD-10-CM | POA: Diagnosis not present

## 2020-10-21 DIAGNOSIS — Z20822 Contact with and (suspected) exposure to covid-19: Secondary | ICD-10-CM | POA: Diagnosis not present

## 2020-10-21 DIAGNOSIS — O411230 Chorioamnionitis, third trimester, not applicable or unspecified: Secondary | ICD-10-CM | POA: Diagnosis not present

## 2020-10-21 DIAGNOSIS — O4202 Full-term premature rupture of membranes, onset of labor within 24 hours of rupture: Secondary | ICD-10-CM | POA: Diagnosis not present

## 2020-10-21 LAB — URINALYSIS, ROUTINE W REFLEX MICROSCOPIC
Bilirubin Urine: NEGATIVE
Glucose, UA: NEGATIVE mg/dL
Hgb urine dipstick: NEGATIVE
Ketones, ur: NEGATIVE mg/dL
Leukocytes,Ua: NEGATIVE
Nitrite: NEGATIVE
Protein, ur: NEGATIVE mg/dL
Specific Gravity, Urine: 1.002 — ABNORMAL LOW (ref 1.005–1.030)
pH: 7 (ref 5.0–8.0)

## 2020-10-21 MED ORDER — OXYCODONE HCL 5 MG PO TABS
5.0000 mg | ORAL_TABLET | Freq: Once | ORAL | Status: AC
  Administered 2020-10-21: 5 mg via ORAL
  Filled 2020-10-21: qty 1

## 2020-10-21 MED ORDER — BUTALBITAL-APAP-CAFFEINE 50-325-40 MG PO TABS
2.0000 | ORAL_TABLET | Freq: Once | ORAL | Status: AC
  Administered 2020-10-21: 2 via ORAL
  Filled 2020-10-21: qty 2

## 2020-10-21 NOTE — MAU Provider Note (Signed)
History     CSN: [id number]  Arrival date and time: 10/21/20 2110   Event Date/Time   First Provider Initiated Contact with Patient 10/21/20 2204      Chief Complaint  Patient presents with  . Headache  . Back Pain   HPI Caitlyn Green is a 20 y.o. G1P0000 at [redacted]w[redacted]d who presents with a headache, back pain and losing her mucous plug. She reports a history of migraines and states she has a migraine now. She reports feeling like she was not able to see her hand when it was below her eye level. She reports being able to see normally when looking ahead but now the vision symptoms have resolved. She rates the headache a 10/10 and has not tried anything for the pain. She has had 4 bottles of water today and reports eating regularly throughout the day. She also reports ongoing lower back pain that has been happening throughout the pregnancy. She was worried about the back pain tonight because she was also losing pieces of her mucous plug. She is unsure if she is having contractions. She denies any vaginal bleeding or leaking. She reports normal fetal movement. She was told by the on call nurse that her symptoms were "very concerning and needed to be assessed immediately."  OB History    Gravida  1   Para  0   Term  0   Preterm  0   AB  0   Living  0     SAB  0   IAB  0   Ectopic  0   Multiple  0   Live Births  0           Past Medical History:  Diagnosis Date  . Back strain   . Rheumatoid arthritis [facility])     Past Surgical History:  Procedure Laterality Date  . NO PAST SURGERIES      Family History  Problem Relation Age of Onset  . Healthy Mother   . Cancer Father   . Arthritis Maternal Grandmother   . Healthy Sister   . Healthy Sister   . Healthy Brother     Social History   Tobacco Use  . Smoking status: Never Smoker  . Smokeless tobacco: Never Used  Vaping Use  . Vaping Use: Former  Substance Use Topics  . Alcohol use: No  . Drug use: No     Allergies: No Known Allergies  Medications Prior to Admission  Medication Sig Dispense Refill Last Dose  . aspirin 81 MG EC tablet Take 1 tablet (81 mg total) by mouth daily. Swallow whole. (Patient taking differently: Take 81 mg by mouth. Swallow whole.) 30 tablet 8 Past Month at Unknown time  . cyclobenzaprine (FLEXERIL) 10 MG tablet Take 1 tablet (10 mg total) by mouth every 8 (eight) hours as needed for muscle spasms. 30 tablet 1 Past Month at Unknown time  . Prenatal Vit-Iron Carbonyl-FA (PRENATAL PLUS IRON) 29-1 MG TABS Take 1 daily (Patient taking differently: No sig reported) 30 tablet 12 Past Week at Unknown time    Review of Systems  Constitutional: Negative.  Negative for fatigue and fever.  HENT: Negative.   Respiratory: Negative.  Negative for shortness of breath.   Cardiovascular: Negative.  Negative for chest pain.  Gastrointestinal: Negative.  Negative for abdominal pain, constipation, diarrhea, nausea and vomiting.  Genitourinary: Positive for vaginal discharge. Negative for dysuria and vaginal bleeding.  Musculoskeletal: Positive for back pain.  Neurological: Positive for headaches.  Negative for dizziness.   Physical Exam   Blood pressure 133/82, pulse 77, temperature 98.5 F (36.9 C), temperature source Oral, resp. rate 16, last menstrual period 01/22/2020, SpO2 100 %.  Physical Exam Vitals and nursing note reviewed.  Constitutional:      General: She is not in acute distress.    Appearance: She is well-developed.  HENT:     Head: Normocephalic.  Eyes:     Pupils: Pupils are equal, round, and reactive to light.  Cardiovascular:     Rate and Rhythm: Normal rate and regular rhythm.     Heart sounds: Normal heart sounds.  Pulmonary:     Effort: Pulmonary effort is normal. No respiratory distress.     Breath sounds: Normal breath sounds.  Abdominal:     General: Bowel sounds are normal. There is no distension.     Palpations: Abdomen is soft.      Tenderness: There is no abdominal tenderness.  Skin:    General: Skin is warm and dry.  Neurological:     Mental Status: She is alert and oriented to person, place, and time.  Psychiatric:        Behavior: Behavior normal.        Thought Content: Thought content normal.        Judgment: Judgment normal.    Fetal Tracing:  Baseline: 130 Variability: moderate Accels: 15x15 Decels: none  Toco: occasional uc's  Dilation: Closed Exam by:: Zenia Resides, RN  MAU Course  Procedures Results for orders placed or performed during the hospital encounter of 10/21/20 (from the past 24 hour(s))  Urinalysis, Routine w reflex microscopic Urine, Clean Catch     Status: Abnormal   Collection Time: 10/21/20  9:27 PM  Result Value Ref Range   Color, Urine STRAW (A) YELLOW   APPearance CLEAR CLEAR   Specific Gravity, Urine 1.002 (L) 1.005 - 1.030   pH 7.0 5.0 - 8.0   Glucose, UA NEGATIVE NEGATIVE mg/dL   Hgb urine dipstick NEGATIVE NEGATIVE   Bilirubin Urine NEGATIVE NEGATIVE   Ketones, ur NEGATIVE NEGATIVE mg/dL   Protein, ur NEGATIVE NEGATIVE mg/dL   Nitrite NEGATIVE NEGATIVE   Leukocytes,Ua NEGATIVE NEGATIVE   MDM UA Fioricet PO- patient reports no relief in pain Oxycodone IR- patient now rates pain a 4/10 and is requesting discharge.   Assessment and Plan   1. Ophthalmoplegic migraine, not intractable   2. [redacted] weeks gestation of pregnancy   3. Back pain affecting pregnancy in third trimester    -Discharge home in stable condition -Labor precautions discussed -Patient advised to follow-up with OB as scheduled for prenatal care -Patient may return to MAU as needed or if her condition were to change or worsen  Rolm Bookbinder CNM 10/21/2020, 10:05 PM

## 2020-10-21 NOTE — MAU Note (Signed)
..  Caitlyn Green is a 20 y.o. at [redacted]w[redacted]d here in MAU reporting: A migraine and visual changes where she can see straight ahead but when she looks down it is blurry that began around 4:30-5pm. She reports some lower back pain. Feels baby move but not as much as usual.   Pain score: 5/10 back pain 6/10 headache  FHT: 130

## 2020-10-21 NOTE — Discharge Instructions (Signed)

## 2020-10-23 ENCOUNTER — Other Ambulatory Visit: Payer: Self-pay | Admitting: Advanced Practice Midwife

## 2020-10-23 ENCOUNTER — Ambulatory Visit (INDEPENDENT_AMBULATORY_CARE_PROVIDER_SITE_OTHER): Payer: BC Managed Care – PPO | Admitting: Obstetrics & Gynecology

## 2020-10-23 ENCOUNTER — Other Ambulatory Visit: Payer: Self-pay

## 2020-10-23 ENCOUNTER — Inpatient Hospital Stay (EMERGENCY_DEPARTMENT_HOSPITAL)
Admission: AD | Admit: 2020-10-23 | Discharge: 2020-10-23 | Disposition: A | Discharge status: Home / Self Care | Payer: BC Managed Care – PPO | Source: Home / Self Care | Attending: Obstetrics and Gynecology | Admitting: Obstetrics and Gynecology

## 2020-10-23 ENCOUNTER — Encounter: Payer: Self-pay | Admitting: Obstetrics & Gynecology

## 2020-10-23 ENCOUNTER — Ambulatory Visit (INDEPENDENT_AMBULATORY_CARE_PROVIDER_SITE_OTHER): Payer: BC Managed Care – PPO

## 2020-10-23 VITALS — BP 128/85 | HR 69 | Wt 150.0 lb

## 2020-10-23 DIAGNOSIS — Z3689 Encounter for other specified antenatal screening: Secondary | ICD-10-CM

## 2020-10-23 DIAGNOSIS — O365930 Maternal care for other known or suspected poor fetal growth, third trimester, not applicable or unspecified: Secondary | ICD-10-CM

## 2020-10-23 DIAGNOSIS — IMO0002 Reserved for concepts with insufficient information to code with codable children: Secondary | ICD-10-CM

## 2020-10-23 DIAGNOSIS — Z3403 Encounter for supervision of normal first pregnancy, third trimester: Secondary | ICD-10-CM

## 2020-10-23 DIAGNOSIS — Z3A37 37 weeks gestation of pregnancy: Secondary | ICD-10-CM

## 2020-10-23 DIAGNOSIS — O471 False labor at or after 37 completed weeks of gestation: Secondary | ICD-10-CM | POA: Insufficient documentation

## 2020-10-23 DIAGNOSIS — O0993 Supervision of high risk pregnancy, unspecified, third trimester: Secondary | ICD-10-CM

## 2020-10-23 DIAGNOSIS — O26843 Uterine size-date discrepancy, third trimester: Secondary | ICD-10-CM

## 2020-10-23 NOTE — MAU Provider Note (Signed)
S: [name] is a 20 y.o. G1P0000 at [redacted]w[redacted]d  who presents to MAU today complaining constant back pain overnight, woke her up repeatedly and intermittent rectal pressure. Denies contractions (but is having them q4-19min) She denies vaginal bleeding. She denies LOF. She reports normal fetal movement.    O: BP 131/74   Pulse (!) 56   Temp 98 F (36.7 C) (Oral)   Resp 17   LMP 01/22/2020 (Approximate)   SpO2 100%  GENERAL: Well-developed, well-nourished female in no acute distress.  HEAD: Normocephalic, atraumatic.  CHEST: Normal effort of breathing, regular heart rate ABDOMEN: Soft, nontender, gravid  Cervical exam:  Dilation: Closed Effacement (%): Thick Presentation: Vertex Exam by:: Manuela Neptune, RN  Fetal Monitoring: reactive Baseline: 125 Variability: moderate Accelerations: 15x15 Decelerations: none Contractions: q4-53min  A: SIUP at [redacted]w[redacted]d  False labor  P: Discharge home in stable condition with term labor precautions   - given instructions for the [employer] and encouraged her to take an epsom salt bath and take flexeril for her back pain Follow up at [facility] as scheduled for ongoing prenatal care - likely induction on 10/24/20 for IUGR  Bernerd Limbo, CNM 10/23/2020 7:03 AM

## 2020-10-23 NOTE — Progress Notes (Signed)
   HIGH-RISK PREGNANCY VISIT Patient name: Caitlyn Green MRN [id number]  Date of birth: 2000/10/27 Chief Complaint:   Routine Prenatal Visit ([place] today; "stomach looks smaller"; + contractions-[facility])  History of Present Illness:   Caitlyn Green is a 20 y.o. G37P0000 female at [redacted]w[redacted]d with an Estimated Date of Delivery: 11/07/20 being seen today for ongoing management of a high-risk pregnancy complicated by fetal growth restriction 9.8%.  Today she reports contractions.  Depression screen [facility] 2/9 04/30/2020 03/13/2020  Decreased Interest 1 0  Down, Depressed, Hopeless 1 1  PHQ - 2 Score 2 1  Altered sleeping 3 1  Tired, decreased energy 1 1  Change in appetite 1 0  Feeling bad or failure about yourself  0 1  Trouble concentrating 0 0  Moving slowly or fidgety/restless 0 0  Suicidal thoughts 0 0  PHQ-9 Score 7 4  Difficult doing work/chores - Not difficult at all    Contractions: Irregular. Vag. Bleeding: None.  Movement: (!) Decreased. denies leaking of fluid.  Review of Systems:   Pertinent items are noted in HPI Denies abnormal vaginal discharge w/ itching/odor/irritation, headaches, visual changes, shortness of breath, chest pain, abdominal pain, severe nausea/vomiting, or problems with urination or bowel movements unless otherwise stated above. Pertinent History Reviewed:  Reviewed past medical,surgical, social, obstetrical and family history.  Reviewed problem list, medications and allergies. Physical Assessment:   Vitals:   10/23/20 1400  BP: 128/85  Pulse: 69  Weight: 150 lb (68 kg)  Body mass index is 20.92 kg/m.           Physical Examination:   General appearance: alert, well appearing, and in no distress  Mental status: alert, oriented to person, place, and time  Skin: warm & dry   Extremities: Edema: Trace    Cardiovascular: normal heart rate noted  Respiratory: normal respiratory effort, no distress  Abdomen: gravid, soft, non-tender  Pelvic:  Cervical exam deferred         Fetal Status:     Movement: (!) Decreased    Fetal Surveillance Testing today: BPP 8/8 with Doppler 84%, borderline fluid volume, SDP 3 cm   Chaperone: N/A    No results found for this or any previous visit (from the past 24 hour(s)).  Assessment & Plan:  High-risk pregnancy: G1P0000 at [redacted]w[redacted]d with an Estimated Date of Delivery: 11/07/20   1) FGR with borderline fluid volume, SDP 3 cm, stable, IOL tomorrow am    Meds: No orders of the defined types were placed in this encounter.   Labs/procedures today: U/S  Treatment Plan:  IOLtomorrow am scheduled  Reviewed: Term labor symptoms and general obstetric precautions including but not limited to vaginal bleeding, contractions, leaking of fluid and fetal movement were reviewed in detail with the patient.  All questions were answered. Does have home bp cuff. Office bp cuff given: not applicable. Check bp daily, let us know if consistently >140 and/or >90.  Follow-up: Return in about 6 weeks (around 12/04/2020) for post partum visit.   Future Appointments  Date Time Provider Department Center  10/24/2020  9:40 AM [facility] SCHED ROOM [facility] None  12/05/2020 11:30 AM Arabella Merles, CNM [facility]    No orders of the defined types were placed in this encounter.  Lazaro Arms  10/23/2020 2:36 PM

## 2020-10-23 NOTE — Progress Notes (Signed)
[place] 37+6 wks,cephalic,anterior placenta gr 3,fhr 135 bpm,BPP 8/8,RI .62,.65,.65,.63=84%,AFI  5.4 cm oligohydramnios

## 2020-10-23 NOTE — Progress Notes (Signed)
Patient ID: Tamaria Dunleavy, female   DOB: 03/07/2001, 20 y.o.   MRN: [id number]   Induction Assessment Scheduling Form: [facility] to [facility]:  [phone]  Sonda Coppens                                                                                   DOB:  24-Jun-2001                                                            MRN:  [id number]                                                                     Phone #:   [phone]                         Provider:  [facility]  GP:  G1P0000                                                            Estimated Date of Delivery: 11/07/20  Dating Criteria: LMP, 1st trimester scan    Medical Indications for induction:  FGR with borderline fluid volume Admission Date/Time:  10/24/20 AM Gestational age on admission:  [redacted]w[redacted]d   Filed Weights   10/23/20 1400  Weight: 150 lb (68 kg)   HIV:  Non Reactive (02/09 0917) GBS: Negative/-- (04/08 1202)  Cervix deferred LTC earlier today   Method of induction(proposed):  cytotec   Scheduling Provider Signature:  Lazaro Arms, MD                                            Today's Date:  10/23/2020

## 2020-10-23 NOTE — MAU Note (Addendum)
Patient arrived to MAU complaining of lower back pain last over the night and at one point she felt like she had to have a bowel movement but does not have to go. Denies contractions or leaking of fluid. +FM.  SVE: closed yesterday

## 2020-10-23 NOTE — Discharge Instructions (Signed)

## 2020-10-24 ENCOUNTER — Inpatient Hospital Stay (HOSPITAL_COMMUNITY)
Admission: AD | Admit: 2020-10-24 | Discharge: 2020-10-27 | DRG: 807 | Disposition: A | Discharge status: Home / Self Care | Payer: BC Managed Care – PPO | Attending: Obstetrics & Gynecology | Admitting: Obstetrics & Gynecology

## 2020-10-24 ENCOUNTER — Inpatient Hospital Stay (HOSPITAL_COMMUNITY)
Admission: AD | Admit: 2020-10-24 | Payer: BC Managed Care – PPO | Source: Home / Self Care | Admitting: Obstetrics and Gynecology

## 2020-10-24 ENCOUNTER — Other Ambulatory Visit: Payer: Self-pay

## 2020-10-24 ENCOUNTER — Inpatient Hospital Stay (HOSPITAL_COMMUNITY): Payer: BC Managed Care – PPO | Attending: Obstetrics and Gynecology

## 2020-10-24 ENCOUNTER — Encounter (HOSPITAL_COMMUNITY): Payer: Self-pay | Admitting: Obstetrics and Gynecology

## 2020-10-24 DIAGNOSIS — O4292 Full-term premature rupture of membranes, unspecified as to length of time between rupture and onset of labor: Principal | ICD-10-CM | POA: Diagnosis present

## 2020-10-24 DIAGNOSIS — Z3A38 38 weeks gestation of pregnancy: Secondary | ICD-10-CM

## 2020-10-24 DIAGNOSIS — O4202 Full-term premature rupture of membranes, onset of labor within 24 hours of rupture: Secondary | ICD-10-CM | POA: Diagnosis not present

## 2020-10-24 DIAGNOSIS — O4290 Premature rupture of membranes, unspecified as to length of time between rupture and onset of labor, unspecified weeks of gestation: Secondary | ICD-10-CM

## 2020-10-24 DIAGNOSIS — Z20822 Contact with and (suspected) exposure to covid-19: Secondary | ICD-10-CM | POA: Diagnosis present

## 2020-10-24 DIAGNOSIS — O365930 Maternal care for other known or suspected poor fetal growth, third trimester, not applicable or unspecified: Secondary | ICD-10-CM | POA: Diagnosis present

## 2020-10-24 DIAGNOSIS — O26893 Other specified pregnancy related conditions, third trimester: Secondary | ICD-10-CM | POA: Diagnosis present

## 2020-10-24 DIAGNOSIS — O365931 Maternal care for other known or suspected poor fetal growth, third trimester, fetus 1: Secondary | ICD-10-CM | POA: Diagnosis not present

## 2020-10-24 DIAGNOSIS — O9081 Anemia of the puerperium: Secondary | ICD-10-CM | POA: Diagnosis not present

## 2020-10-24 LAB — TYPE AND SCREEN
ABO/RH(D): A POS
Antibody Screen: NEGATIVE

## 2020-10-24 LAB — RESP PANEL BY RT-PCR (FLU A&B, COVID) ARPGX2
Influenza A by PCR: NEGATIVE
Influenza B by PCR: NEGATIVE
SARS Coronavirus 2 by RT PCR: NEGATIVE

## 2020-10-24 LAB — CBC
HCT: 27.5 % — ABNORMAL LOW (ref 36.0–46.0)
Hemoglobin: 8.9 g/dL — ABNORMAL LOW (ref 12.0–15.0)
MCH: 28.3 pg (ref 26.0–34.0)
MCHC: 32.4 g/dL (ref 30.0–36.0)
MCV: 87.6 fL (ref 80.0–100.0)
Platelets: 182 10*3/uL (ref 150–400)
RBC: 3.14 MIL/uL — ABNORMAL LOW (ref 3.87–5.11)
RDW: 13.8 % (ref 11.5–15.5)
WBC: 4.9 10*3/uL (ref 4.0–10.5)
nRBC: 0 % (ref 0.0–0.2)

## 2020-10-24 LAB — AMNISURE RUPTURE OF MEMBRANE (ROM) NOT AT ARMC: Amnisure ROM: POSITIVE

## 2020-10-24 LAB — POCT FERN TEST: POCT Fern Test: NEGATIVE

## 2020-10-24 MED ORDER — LIDOCAINE HCL (PF) 1 % IJ SOLN
30.0000 mL | INTRAMUSCULAR | Status: DC | PRN
  Filled 2020-10-24: qty 30

## 2020-10-24 MED ORDER — LACTATED RINGERS IV SOLN: INTRAVENOUS | Status: DC

## 2020-10-24 MED ORDER — SOD CITRATE-CITRIC ACID 500-334 MG/5ML PO SOLN: 30.0000 mL | ORAL | Status: DC | PRN

## 2020-10-24 MED ORDER — MISOPROSTOL 25 MCG QUARTER TABLET: 25.0000 ug | ORAL_TABLET | ORAL | Status: DC | PRN

## 2020-10-24 MED ORDER — OXYTOCIN BOLUS FROM INFUSION
333.0000 mL | Freq: Once | INTRAVENOUS | Status: AC
  Administered 2020-10-25: 333 mL via INTRAVENOUS

## 2020-10-24 MED ORDER — ACETAMINOPHEN 325 MG PO TABS: 650.0000 mg | ORAL_TABLET | ORAL | Status: DC | PRN

## 2020-10-24 MED ORDER — OXYTOCIN-SODIUM CHLORIDE 30-0.9 UT/500ML-% IV SOLN
2.5000 [IU]/h | INTRAVENOUS | Status: DC
  Administered 2020-10-25: 2.5 [IU]/h via INTRAVENOUS

## 2020-10-24 MED ORDER — MISOPROSTOL 50MCG HALF TABLET
ORAL_TABLET | ORAL | Status: AC
  Administered 2020-10-24: 50 ug via ORAL
  Filled 2020-10-24: qty 1

## 2020-10-24 MED ORDER — FENTANYL CITRATE (PF) 100 MCG/2ML IJ SOLN
50.0000 ug | INTRAMUSCULAR | Status: DC | PRN
  Administered 2020-10-24 – 2020-10-25 (×4): 100 ug via INTRAVENOUS
  Filled 2020-10-24 (×4): qty 2

## 2020-10-24 MED ORDER — TERBUTALINE SULFATE 1 MG/ML IJ SOLN: 0.2500 mg | Freq: Once | INTRAMUSCULAR | Status: DC | PRN

## 2020-10-24 MED ORDER — OXYCODONE-ACETAMINOPHEN 5-325 MG PO TABS: 2.0000 | ORAL_TABLET | ORAL | Status: DC | PRN

## 2020-10-24 MED ORDER — ONDANSETRON HCL 4 MG/2ML IJ SOLN
4.0000 mg | Freq: Four times a day (QID) | INTRAMUSCULAR | Status: DC | PRN
  Administered 2020-10-25: 4 mg via INTRAVENOUS
  Filled 2020-10-24: qty 2

## 2020-10-24 MED ORDER — MISOPROSTOL 50MCG HALF TABLET
50.0000 ug | ORAL_TABLET | ORAL | Status: DC | PRN
  Administered 2020-10-24 (×2): 50 ug via ORAL
  Filled 2020-10-24 (×2): qty 1

## 2020-10-24 MED ORDER — OXYTOCIN-SODIUM CHLORIDE 30-0.9 UT/500ML-% IV SOLN
1.0000 m[IU]/min | INTRAVENOUS | Status: DC
Start: 2020-10-24 — End: 2020-10-25
  Administered 2020-10-25: 2 m[IU]/min via INTRAVENOUS
  Filled 2020-10-24: qty 500

## 2020-10-24 MED ORDER — LACTATED RINGERS IV SOLN: 500.0000 mL | INTRAVENOUS | Status: DC | PRN

## 2020-10-24 MED ORDER — OXYCODONE-ACETAMINOPHEN 5-325 MG PO TABS: 1.0000 | ORAL_TABLET | ORAL | Status: DC | PRN

## 2020-10-24 NOTE — MAU Note (Signed)
Caitlyn Green is a 20 y.o. at [redacted]w[redacted]d here in MAU reporting: woke up this AM with LOF on her bed, states she saw a large wet spot and is unsure about color. Having some tension in her back but denies contractions. DFM.   Onset of complaint: today  Pain score: 4/10  Vitals:   10/24/20 0821  BP: 137/81  Pulse: 69  Resp: 16  Temp: 98.8 F (37.1 C)  SpO2: 100%     FHT: EFM applied  Lab orders placed from triage: fern slide

## 2020-10-24 NOTE — MAU Provider Note (Signed)
History     CSN: [id number]  Arrival date and time: 10/24/20 6222   Event Date/Time   First Provider Initiated Contact with Patient 10/24/20 [phone]      Chief Complaint  Patient presents with  . Back Pain  . Decreased Fetal Movement  . Rupture of Membranes   [name] is a 20 y.o. year old G80P0000 female at [redacted]w[redacted]d weeks gestation who presents to MAU reporting she woke up this morning with a large wet spot in the bed; unsure of the color "but didn't smell like pee." She reports "tension" in her back, but no UC's; pain rated 4/10. She also reports DFM today. She is scheduled for IOL today, but L&D told her she had to be seen in MAU. She reports she saw "a spot of blood" after the fluid came out this morning. She receives [facility] with [facility]. Her FOB is present and contributing to the history taking.    OB History    Gravida  1   Para  0   Term  0   Preterm  0   AB  0   Living  0     SAB  0   IAB  0   Ectopic  0   Multiple  0   Live Births  0           Past Medical History:  Diagnosis Date  . Back strain   . Rheumatoid arthritis [facility])     Past Surgical History:  Procedure Laterality Date  . NO PAST SURGERIES      Family History  Problem Relation Age of Onset  . Healthy Mother   . Cancer Father   . Arthritis Maternal Grandmother   . Healthy Sister   . Healthy Sister   . Healthy Brother     Social History   Tobacco Use  . Smoking status: Never Smoker  . Smokeless tobacco: Never Used  Vaping Use  . Vaping Use: Former  Substance Use Topics  . Alcohol use: No  . Drug use: No    Allergies: No Known Allergies  Medications Prior to Admission  Medication Sig Dispense Refill Last Dose  . aspirin 81 MG EC tablet Take 1 tablet (81 mg total) by mouth daily. Swallow whole. (Patient not taking: Reported on 10/23/2020) 30 tablet 8   . cyclobenzaprine (FLEXERIL) 10 MG tablet Take 1 tablet (10 mg total) by mouth every 8 (eight) hours as needed  for muscle spasms. 30 tablet 1   . Prenatal Vit-Iron Carbonyl-FA (PRENATAL PLUS IRON) 29-1 MG TABS Take 1 daily (Patient not taking: Reported on 10/23/2020) 30 tablet 12     Review of Systems  Constitutional: Negative.   HENT: Negative.   Endocrine: Negative.   Genitourinary: Positive for vaginal bleeding (1 spot of blood this AM) and vaginal discharge.  Musculoskeletal: Negative.   Skin: Negative.   Allergic/Immunologic: Negative.   Neurological: Negative.   Hematological: Negative.   Psychiatric/Behavioral: Negative.    Physical Exam   Blood pressure 126/76, pulse 66, temperature 98.8 F (37.1 C), temperature source Oral, resp. rate 16, last menstrual period 01/22/2020, SpO2 100 %.  Physical Exam Vitals and nursing note reviewed. Exam conducted with a chaperone present.  Constitutional:      Appearance: Normal appearance. She is normal weight.  Genitourinary:    General: Normal vulva.     Comments: Dilation: Closed Effacement (%): Thick Cervical Position: Posterior Exam by: Raelyn Mora cnm  Neurological:  Mental Status: She is alert and oriented to person, place, and time.  Psychiatric:        Mood and Affect: Mood normal.        Behavior: Behavior normal.        Thought Content: Thought content normal.        Judgment: Judgment normal.    REACTIVE NST - FHR: 130 bpm / moderate variability / accels present / decels absent / TOCO: regular every 4-5 mins  MAU Course  Procedures Patient informed that the ultrasound is considered a limited OB ultrasound and is not intended to be a complete ultrasound exam.  Patient also informed that the ultrasound is not being completed with the intent of assessing for fetal or placental anomalies or any pelvic abnormalities.  Explained that the purpose of today's ultrasound is to assess for presentation.  Baby was found to be in a cephalic presentation. Patient acknowledges the purpose of the exam and the limitations of the  study.  MDM Crist Fat Slide Amnisure -- results were NEGATIVE per Agustin Cree, RN @ [phone]  Results for orders placed or performed during the hospital encounter of 10/24/20 (from the past 24 hour(s))  Amnisure rupture of membrane (rom)not at [facility]     Status: None   Collection Time: 10/24/20  8:52 AM  Result Value Ref Range   Amnisure ROM POSITIVE   POCT fern test     Status: None   Collection Time: 10/24/20  9:30 AM  Result Value Ref Range   POCT Fern Test Negative = intact amniotic membranes      Assessment and Plan  1. Leakage, amniotic fluid 2. Indication for care in labor or delivery  - Routine admission orders by labor team - RN to notify labor team of admission  Raelyn Mora, CNM 10/24/2020, 9:49 AM

## 2020-10-24 NOTE — H&P (Addendum)
OBSTETRIC ADMISSION HISTORY AND PHYSICAL  Caitlyn Green is a 20 y.o. female G1P0000 with IUP at [redacted]w[redacted]d by 9wk [place] presenting with SROM this morning. She was already scheduled for IOL at 38 wk (today) in the setting of FGR with borderline fluid volume. She reports +Fms, no blurry vision, headaches or peripheral edema, and RUQ pain. She does feel contractions and rates them a 5/10 in severity. She plans on breast and bottle feeding. She request pills for birth control. She received her prenatal [facility] at [facility]   Dating: By 9 wk [place] --->  Estimated Date of Delivery: 11/07/20  Sono:  @[redacted]w[redacted]d , normal anatomy, cephalic presentation, AFI 9.3 cm, 2534g, 9.8% EFW BPP @[redacted]w[redacted]d  8/8 with Doppler 84%, borderline fluid volume, SDP 3 cm   Prenatal History/Complications: IUGR - EFW 9.8 %ile at [redacted]w[redacted]d, normal dopplers  Rheumatoid Arthritis   Past Medical History: Past Medical History:  Diagnosis Date  . Back strain   . Rheumatoid arthritis [facility])     Past Surgical History: Past Surgical History:  Procedure Laterality Date  . NO PAST SURGERIES      Obstetrical History: OB History    Gravida  1   Para  0   Term  0   Preterm  0   AB  0   Living  0     SAB  0   IAB  0   Ectopic  0   Multiple  0   Live Births  0           Social History Social History   Socioeconomic History  . Marital status: Single    Spouse name: Not on file  . Number of children: Not on file  . Years of education: Not on file  . Highest education level: Not on file  Occupational History  . Occupation: [employer]   Tobacco Use  . Smoking status: Never Smoker  . Smokeless tobacco: Never Used  Vaping Use  . Vaping Use: Former  Substance and Sexual Activity  . Alcohol use: No  . Drug use: No  . Sexual activity: Yes    Birth control/protection: None  Other Topics Concern  . Not on file  Social History Narrative  . Not on file   Social Determinants of Health   Financial Resource Strain: Low Risk    . Difficulty of Paying Living Expenses: Not hard at all  Food Insecurity: No Food Insecurity  . Worried About [redacted]w[redacted]d in the Last Year: Never true  . Ran Out of Food in the Last Year: Never true  Transportation Needs: No Transportation Needs  . Lack of Transportation (Medical): No  . Lack of Transportation (Non-Medical): No  Physical Activity: Insufficiently Active  . Days of Exercise per Week: 2 days  . Minutes of Exercise per Session: 10 min  Stress: No Stress Concern Present  . Feeling of Stress : Only a little  Social Connections: Moderately Integrated  . Frequency of Communication with Friends and Family: More than three times a week  . Frequency of Social Gatherings with Friends and Family: Once a week  . Attends Religious Services: More than 4 times per year  . Active Member of Clubs or Organizations: Yes  . Attends [facility] Meetings: More than 4 times per year  . Marital Status: Never married    Family History: Family History  Problem Relation Age of Onset  . Healthy Mother   . Cancer Father   . Arthritis Maternal Grandmother   .  Healthy Sister   . Healthy Sister   . Healthy Brother     Allergies: No Known Allergies  Medications Prior to Admission  Medication Sig Dispense Refill Last Dose  . aspirin 81 MG EC tablet Take 1 tablet (81 mg total) by mouth daily. Swallow whole. (Patient not taking: Reported on 10/23/2020) 30 tablet 8   . cyclobenzaprine (FLEXERIL) 10 MG tablet Take 1 tablet (10 mg total) by mouth every 8 (eight) hours as needed for muscle spasms. 30 tablet 1   . Prenatal Vit-Iron Carbonyl-FA (PRENATAL PLUS IRON) 29-1 MG TABS Take 1 daily (Patient not taking: Reported on 10/23/2020) 30 tablet 12      Review of Systems   All systems reviewed and negative except as stated in HPI  Blood pressure 118/69, pulse 71, temperature 98.8 F (37.1 C), temperature source Oral, resp. rate 16, last menstrual period 01/22/2020, SpO2 100  %. General appearance: alert and no distress Lungs: normal work of breathing  Heart: regular rate Abdomen: soft, non-tender; bowel sounds normal Extremities: no sign of DVT Presentation: cephalic Fetal monitoringBaseline: 140 bpm, Variability: Good {> 6 bpm), Accelerations: Reactive and Decelerations: Absent Uterine activityFrequency: Every 5 minutes and Intensity: mild  Prenatal labs: ABO, Rh: --/--/A POS (04/20 1105) Antibody: NEG (04/20 1105) Rubella: 2.96 (10/25 1641) RPR: Non Reactive (02/09 0917)  HBsAg: Negative (10/25 1641)  HIV: Non Reactive (02/09 0917)  GBS: Negative/-- (04/08 1202)  2 hr Glucola 78/127/97 Genetic screening normal Anatomy US: IUGR, normal anatomy  Prenatal Transfer Tool  Maternal Diabetes: No Genetic Screening: Normal Maternal Ultrasounds/Referrals: IUGR Fetal Ultrasounds or other Referrals:  Referred to Materal Fetal Medicine  Maternal Substance Abuse:  No Significant Maternal Medications:  Meds include: Other:  Hydroxychloroquine (first 2 weeks) Significant Maternal Lab Results: Group B Strep negative  Results for orders placed or performed during the hospital encounter of 10/24/20 (from the past 24 hour(s))  Amnisure rupture of membrane (rom)not at [facility]   Collection Time: 10/24/20  8:52 AM  Result Value Ref Range   Amnisure ROM POSITIVE   POCT fern test   Collection Time: 10/24/20  9:30 AM  Result Value Ref Range   POCT Fern Test Negative = intact amniotic membranes   Type and screen   Collection Time: 10/24/20 11:05 AM  Result Value Ref Range   ABO/RH(D) A POS    Antibody Screen NEG    Sample Expiration      10/27/2020,2359 Performed at [facility], [address], [place], [place] [address]   Resp Panel by RT-PCR (Flu A&B, Covid) Nasopharyngeal Swab   Collection Time: 10/24/20 11:06 AM   Specimen: Nasopharyngeal Swab; Nasopharyngeal(NP) swabs in vial transport medium  Result Value Ref Range   SARS Coronavirus 2 by RT PCR  NEGATIVE NEGATIVE   Influenza A by PCR NEGATIVE NEGATIVE   Influenza B by PCR NEGATIVE NEGATIVE    Patient Active Problem List   Diagnosis Date Noted  . Poor fetal growth affecting management of mother in third trimester, fetus 1 10/24/2020  . Poor fetal growth affecting management of mother in third trimester 10/18/2020  . Uterine size date discrepancy 08/31/2020  . Abnormal chromosomal and genetic finding on antenatal screening mother 05/21/2020  . Supervision of normal first pregnancy 04/26/2020  . 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 Asthma 04/05/2012    Assessment/Plan:  Caitlyn Green is a 20 y.o. G1P0000 at [redacted]w[redacted]d here for IOL-FGR//PROM at ~0600.   #Induction of Labor: will proceed with cytotec  and reevaluate in 4 hours    #Pain: PRN, interested in epidural  #FWB: Cat 1  #ID: GBS (-) #Normocytic Anemia: Hgb 8.9   #MOF: breast/bottle #MOC: POPs #Circ:  yes  Dot Lanes, Medical Student  10/24/2020, 12:37 PM  I saw and evaluated the patient. I agree with the findings and the plan of care as documented in the medical student's note. I have made any necessary changes above.   Casper Harrison, MD [facility] Family Medicine Fellow, [facility] for [facility], [facility]

## 2020-10-24 NOTE — Progress Notes (Addendum)
LABOR PROGRESS NOTE  Caitlyn Green is a 20 y.o. G1P0000 at [redacted]w[redacted]d  admitted for IOL-FGR//PROM at ~0600 on 4/20.   Subjective: Doing well. No complaints, feeling contractions.  Objective: BP 124/73   Pulse 72   Temp 98.6 F (37 C) (Oral)   Resp 16   LMP 01/22/2020 (Approximate)   SpO2 100%  or  Vitals:   10/24/20 1131 10/24/20 1146 10/24/20 1201 10/24/20 1728  BP: 122/71 123/73 118/69 124/73  Pulse: 69 74 71 72  Resp:      Temp:    98.6 F (37 C)  TempSrc:    Oral  SpO2:        Cervical Exam:  Dilation: 1 Effacement (%): Thick Cervical Position: Posterior Station: -1 Presentation: Vertex Exam by:: E Chipps RN FHT: baseline rate 135, moderate varibility, 15x15 acel, no decel Toco: 2-8 min, mild   Labs: Lab Results  Component Value Date   WBC 4.9 10/24/2020   HGB 8.9 (L) 10/24/2020   HCT 27.5 (L) 10/24/2020   MCV 87.6 10/24/2020   PLT 182 10/24/2020    Patient Active Problem List   Diagnosis Date Noted  . Poor fetal growth affecting management of mother in third trimester, fetus 1 10/24/2020  . Poor fetal growth affecting management of mother in third trimester 10/18/2020  . Uterine size date discrepancy 08/31/2020  . Abnormal chromosomal and genetic finding on antenatal screening mother 05/21/2020  . Supervision of normal first pregnancy 04/26/2020  . 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 Asthma 04/05/2012    Assessment / Plan: 20 y.o. G1P0000 at [redacted]w[redacted]d here for IOL-FGR//PROM @0600  4/20.  Labor: s/p cytotec x1 at 1420. Given exam, FB not attempted. Will redose cytotec.  Fetal Wellbeing:  Cat 1 Pain Control:  Planning epidural  Anemia: admit hgb 8.9, asymptomatic, IV iron postpartum  5/20, MS3 10/24/2020, 6:45 PM   GME ATTESTATION:  I saw and evaluated the patient. I agree with the findings and the plan of care as documented in the medical student's note.  10/26/2020, MD OB Fellow, Faculty [facility],  Center for [facility] 10/24/2020 7:03 PM

## 2020-10-24 NOTE — Progress Notes (Signed)
LABOR PROGRESS NOTE  Caitlyn Green is a 20 y.o. G1P0000 at [redacted]w[redacted]d  admitted for IOL-FGR//PROM at ~0600 on 4/20.   Subjective: Strip reviewed.  Objective: BP 120/82   Pulse 81   Temp 98.6 F (37 C) (Oral)   Resp 16   LMP 01/22/2020 (Approximate)   SpO2 100%  or  Vitals:   10/24/20 1201 10/24/20 1728 10/24/20 1948 10/24/20 2056  BP: 118/69 124/73 115/76 120/82  Pulse: 71 72 60 81  Resp:      Temp:  98.6 F (37 C)    TempSrc:  Oral    SpO2:        Cervical Exam:  Dilation: 1 Effacement (%): 70 Cervical Position: Middle Station: 0 Presentation: Vertex Exam by:: A. Earna Coder, RN FHT: baseline rate 135, moderate varibility, 15x15 acel, no decel Toco: 1-3 min   Labs: Lab Results  Component Value Date   WBC 4.9 10/24/2020   HGB 8.9 (L) 10/24/2020   HCT 27.5 (L) 10/24/2020   MCV 87.6 10/24/2020   PLT 182 10/24/2020    Patient Active Problem List   Diagnosis Date Noted  . Poor fetal growth affecting management of mother in third trimester, fetus 1 10/24/2020  . Poor fetal growth affecting management of mother in third trimester 10/18/2020  . Uterine size date discrepancy 08/31/2020  . Abnormal chromosomal and genetic finding on antenatal screening mother 05/21/2020  . Supervision of normal first pregnancy 04/26/2020  . 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 Asthma 04/05/2012    Assessment / Plan: 20 y.o. G1P0000 at [redacted]w[redacted]d here for IOL-FGR//PROM @0600  4/20.  Labor: s/p cytotec x2. RN re-checked patient and states unchanged from prior will re-dose cyto 50 mcg buccal. Patient not appropriate candidate for FB given cervical effacement. Fetal Wellbeing:  Cat 1 Pain Control:  Planning epidural  Anemia: admit hgb 8.9, asymptomatic, IV iron postpartum  5/20, MD OB Fellow, Faculty Practice [facility], Center for [facility] 10/24/2020 10:30 PM

## 2020-10-25 ENCOUNTER — Inpatient Hospital Stay (HOSPITAL_COMMUNITY): Payer: BC Managed Care – PPO | Admitting: Anesthesiology

## 2020-10-25 ENCOUNTER — Encounter (HOSPITAL_COMMUNITY): Payer: Self-pay | Admitting: Obstetrics & Gynecology

## 2020-10-25 DIAGNOSIS — O365990 Maternal care for other known or suspected poor fetal growth, unspecified trimester, not applicable or unspecified: Secondary | ICD-10-CM

## 2020-10-25 DIAGNOSIS — O365931 Maternal care for other known or suspected poor fetal growth, third trimester, fetus 1: Secondary | ICD-10-CM

## 2020-10-25 DIAGNOSIS — O4202 Full-term premature rupture of membranes, onset of labor within 24 hours of rupture: Secondary | ICD-10-CM

## 2020-10-25 DIAGNOSIS — Z3A38 38 weeks gestation of pregnancy: Secondary | ICD-10-CM

## 2020-10-25 LAB — RPR: RPR Ser Ql: NONREACTIVE

## 2020-10-25 MED ORDER — ACETAMINOPHEN 325 MG PO TABS: 650.0000 mg | ORAL_TABLET | ORAL | Status: DC | PRN

## 2020-10-25 MED ORDER — SENNOSIDES-DOCUSATE SODIUM 8.6-50 MG PO TABS
2.0000 | ORAL_TABLET | Freq: Every day | ORAL | Status: DC
  Administered 2020-10-26: 2 via ORAL
  Filled 2020-10-25 (×2): qty 2

## 2020-10-25 MED ORDER — DIBUCAINE (PERIANAL) 1 % EX OINT: 1.0000 "application " | TOPICAL_OINTMENT | CUTANEOUS | Status: DC | PRN

## 2020-10-25 MED ORDER — ONDANSETRON HCL 4 MG PO TABS: 4.0000 mg | ORAL_TABLET | ORAL | Status: DC | PRN

## 2020-10-25 MED ORDER — SIMETHICONE 80 MG PO CHEW: 80.0000 mg | CHEWABLE_TABLET | ORAL | Status: DC | PRN

## 2020-10-25 MED ORDER — DIPHENHYDRAMINE HCL 50 MG/ML IJ SOLN: 12.5000 mg | INTRAMUSCULAR | Status: DC | PRN

## 2020-10-25 MED ORDER — WITCH HAZEL-GLYCERIN EX PADS: 1.0000 "application " | MEDICATED_PAD | CUTANEOUS | Status: DC | PRN

## 2020-10-25 MED ORDER — TETANUS-DIPHTH-ACELL PERTUSSIS 5-2.5-18.5 LF-MCG/0.5 IM SUSY: 0.5000 mL | PREFILLED_SYRINGE | Freq: Once | INTRAMUSCULAR | Status: DC

## 2020-10-25 MED ORDER — IBUPROFEN 600 MG PO TABS
600.0000 mg | ORAL_TABLET | Freq: Four times a day (QID) | ORAL | Status: DC
  Administered 2020-10-25 – 2020-10-27 (×8): 600 mg via ORAL
  Filled 2020-10-25 (×8): qty 1

## 2020-10-25 MED ORDER — EPHEDRINE 5 MG/ML INJ: 10.0000 mg | INTRAVENOUS | Status: DC | PRN

## 2020-10-25 MED ORDER — ONDANSETRON HCL 4 MG/2ML IJ SOLN: 4.0000 mg | INTRAMUSCULAR | Status: DC | PRN

## 2020-10-25 MED ORDER — LIDOCAINE HCL (PF) 1 % IJ SOLN
INTRAMUSCULAR | Status: DC | PRN
  Administered 2020-10-25: 11 mL via EPIDURAL

## 2020-10-25 MED ORDER — PRENATAL MULTIVITAMIN CH
1.0000 | ORAL_TABLET | Freq: Every day | ORAL | Status: DC
  Administered 2020-10-26 – 2020-10-27 (×2): 1 via ORAL
  Filled 2020-10-25 (×2): qty 1

## 2020-10-25 MED ORDER — COCONUT OIL OIL
1.0000 "application " | TOPICAL_OIL | Status: DC | PRN
  Administered 2020-10-26: 1 via TOPICAL

## 2020-10-25 MED ORDER — ZOLPIDEM TARTRATE 5 MG PO TABS: 5.0000 mg | ORAL_TABLET | Freq: Every evening | ORAL | Status: DC | PRN

## 2020-10-25 MED ORDER — BENZOCAINE-MENTHOL 20-0.5 % EX AERO
1.0000 "application " | INHALATION_SPRAY | CUTANEOUS | Status: DC | PRN
  Administered 2020-10-25: 1 via TOPICAL
  Filled 2020-10-25: qty 56

## 2020-10-25 MED ORDER — SODIUM CHLORIDE 0.9 % IV SOLN
500.0000 mg | Freq: Once | INTRAVENOUS | Status: AC
  Administered 2020-10-26: 500 mg via INTRAVENOUS
  Filled 2020-10-25: qty 25

## 2020-10-25 MED ORDER — PHENYLEPHRINE 40 MCG/ML (10ML) SYRINGE FOR IV PUSH (FOR BLOOD PRESSURE SUPPORT)
80.0000 ug | PREFILLED_SYRINGE | INTRAVENOUS | Status: DC | PRN
  Filled 2020-10-25: qty 10

## 2020-10-25 MED ORDER — DIPHENHYDRAMINE HCL 25 MG PO CAPS
25.0000 mg | ORAL_CAPSULE | Freq: Four times a day (QID) | ORAL | Status: DC | PRN
Start: 2020-10-25 — End: 2020-10-27

## 2020-10-25 MED ORDER — PHENYLEPHRINE 40 MCG/ML (10ML) SYRINGE FOR IV PUSH (FOR BLOOD PRESSURE SUPPORT): 80.0000 ug | PREFILLED_SYRINGE | INTRAVENOUS | Status: DC | PRN

## 2020-10-25 MED ORDER — LACTATED RINGERS IV SOLN: 500.0000 mL | Freq: Once | INTRAVENOUS | Status: DC

## 2020-10-25 MED ORDER — FENTANYL-BUPIVACAINE-NACL 0.5-0.125-0.9 MG/250ML-% EP SOLN
12.0000 mL/h | EPIDURAL | Status: DC | PRN
Start: 2020-10-25 — End: 2020-10-25
  Administered 2020-10-25: 12 mL/h via EPIDURAL
  Filled 2020-10-25: qty 250

## 2020-10-25 MED ORDER — ERYTHROMYCIN 5 MG/GM OP OINT
TOPICAL_OINTMENT | OPHTHALMIC | Status: AC
  Filled 2020-10-25: qty 1

## 2020-10-25 NOTE — Progress Notes (Signed)
LABOR PROGRESS NOTE  Caitlyn Green is a 20 y.o. G1P0000 at [redacted]w[redacted]d  admitted for IOL-FGR//PROM at ~0600 on 4/20.   Subjective: Doing well, epidural placed. Not feeling contractions.   Objective: BP 125/72   Pulse (!) 53   Temp 98.6 F (37 C) (Oral)   Resp 16   LMP 01/22/2020 (Approximate)   SpO2 100%  or  Vitals:   10/25/20 0856 10/25/20 0900 10/25/20 0930 10/25/20 1000  BP: 122/72 117/76 121/72 125/72  Pulse: 62 65 (!) 58 (!) 53  Resp:      Temp:      TempSrc:      SpO2:        Cervical Exam:  Dilation: 4 Effacement (%): 100 Cervical Position: Anterior Station: 0 Presentation: Vertex Exam by:: lee FHT: baseline rate 140-145, moderate varibility, 15x15 acel, no decel Toco: 2-3 min  Labs: Lab Results  Component Value Date   WBC 4.9 10/24/2020   HGB 8.9 (L) 10/24/2020   HCT 27.5 (L) 10/24/2020   MCV 87.6 10/24/2020   PLT 182 10/24/2020    Patient Active Problem List   Diagnosis Date Noted  . Poor fetal growth affecting management of mother in third trimester, fetus 1 10/24/2020  . Poor fetal growth affecting management of mother in third trimester 10/18/2020  . Uterine size date discrepancy 08/31/2020  . Abnormal chromosomal and genetic finding on antenatal screening mother 05/21/2020  . Supervision of normal first pregnancy 04/26/2020  . 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 Asthma 04/05/2012    Assessment / Plan: 20 y.o. G1P0000 at [redacted]w[redacted]d here for IOL-FGR//PROM @0600  4/20.  Labor: s/p cytotec x3. Pitocin started @0300 , ongoing. Making good change on cervical exam.  Fetal Wellbeing:  Cat 1 Pain Control:  Epidural in place  Anemia: admit hgb 8.9, asymptomatic, IV iron postpartum  5/20 Medical Student  10/25/2020 10:12 AM

## 2020-10-25 NOTE — Anesthesia Procedure Notes (Signed)
Epidural Patient location during procedure: OB Start time: 10/25/2020 8:29 AM End time: 10/25/2020 8:38 AM  Staffing Anesthesiologist: Lowella Curb, MD Performed: other anesthesia staff   Preanesthetic Checklist Completed: patient identified, IV checked, site marked, risks and benefits discussed, surgical consent, monitors and equipment checked, pre-op evaluation and timeout performed  Epidural Patient position: sitting Prep: ChloraPrep Patient monitoring: heart rate, cardiac monitor, continuous pulse ox and blood pressure Approach: midline Location: L2-L3 Injection technique: LOR saline  Needle:  Needle type: Tuohy  Needle gauge: 17 G Needle length: 9 cm Catheter type: closed end flexible Catheter size: 20 Guage Test dose: negative  Assessment Events: blood not aspirated, injection not painful, no injection resistance, no paresthesia and negative IV test  Additional Notes Epidural placed by SRNA under direct supervisionReason for block:procedure for pain

## 2020-10-25 NOTE — Lactation Note (Signed)
This note was copied from a baby's chart. Lactation Consultation Note  Patient Name: Caitlyn Green [id number] Date: 10/25/2020 Reason for consult: Follow-up assessment;1st time breastfeeding;Early term 37-38.6wks;Other (Comment) (IUGR) Age:20 hours  Mom's feeding choice is breast and formula feeding. Per mom, infant had 3 stools since birth. P1, ETI female infant, less than 6 lbs at birth. Mom latched infant twice for 10 minutes each on MBU. Mom recently finished breastfeeding infant prior to [facility] entering the room, mom was doing STS with infant, LC discussed hand expression and infant was given 3 mls of colostrum by spoon that mom expressed. Infant was no longer interested in BF at this time.  Mom will use DEBP every 3 hours for 15 minutes on initial setting and give infant back any EBM first before supplementing with formula.  Mom has breastfeeding supplemental sheet, informed how much to supplement infant based on infant's age/ hours of life. Mom knows to breastfeed infant by cues, 8 to 12 or more times within 24 hours, STS.  Mom knows to call RN or LC if she needs any assistance with latching infant at the breast.  Mom was using the DEBP as LC left the room and colostrum was present in breast flanges as mom was pumping. LC discussed Infant's input and output with parents.  Mom made aware of O/P services, breastfeeding support groups, community resources, and our phone # for post-discharge questions.  Mom's plan: 1- Breastfeed infant according to cues, 8 to 12 or more times, STS. 2- Afterwards mom will supplement infant with her EBM first and then Similac Neosure 22kcal with iron based on infant's age/ hours of life Day 1 ( 5-7) mls per feeding. 3- Mom will continue to pump every 3 hours for 15 minutes on initial setting.  Maternal Data Has patient been taught Hand Expression?: Yes Does the patient have breastfeeding experience prior to this delivery?: No  Feeding Mother's Current  Feeding Choice: Breast Milk and Formula  LATCH Score                    Lactation Tools Discussed/Used Tools: Pump Breast pump type: Double-Electric Breast Pump Pump Education: Setup, frequency, and cleaning;Milk Storage Reason for Pumping: To help with breast stimulation and establishing milk supply, infant less than 6 lbs at birth, ETI Pumping frequency: Mom will pump every 3 hours for 15 minutes on inital setting.  Interventions Interventions: Breast feeding basics reviewed;Skin to skin;Hand express;Expressed milk;DEBP;Hand pump;Education  Discharge Pump: DEBP;Personal WIC Program: No  Consult Status Consult Status: Follow-up Date: 10/26/20 Follow-up type: In-patient    Danelle Earthly 10/25/2020, 7:35 PM

## 2020-10-25 NOTE — Consult Note (Deleted)
Called at ~ 13 minutes of life due to increased WOB with grunting and retractions. Mom induced and delivered at  38 weeks due to IUGR. Lungs clear bilaterally. Chest PT given and CPAP initiated at ~15 minutes of life due to continued grunting and retractions. Continued routine NRP and was able to wean CPAP away at ~ 30 minutes of life. Left to do skin to skin with mother and in care of Pediatrician.  Juanpablo Ciresi L, NP 

## 2020-10-25 NOTE — Discharge Summary (Signed)
Postpartum Discharge Summary  Date of Service updated 10/25/20     Patient Name: Caitlyn Green DOB: 2001-03-25 MRN: [id number]  Date of admission: 10/24/2020 Delivery date:10/25/2020  Delivering provider: Renee Harder  Date of discharge: 10/27/2020  Admitting diagnosis: Poor fetal growth affecting management of mother in third trimester, fetus 1 [O36.5931] Intrauterine pregnancy: [redacted]w[redacted]d    Secondary diagnosis:  Active Problems:   Poor fetal growth affecting management of mother in third trimester, fetus 1   SVD (spontaneous vaginal delivery)  Additional problems: anemia PP, Venofer given 4/22    Discharge diagnosis: Term Pregnancy Delivered                                              Post partum procedures:Venofer Augmentation: Pitocin and Cytotec Complications: None  Hospital course: Induction of Labor With Vaginal Delivery   20y.o. yo G1P0000 at 371w1das admitted to the hospital 10/24/2020 for induction of labor.  Indication for induction: PROM.  Patient had an uncomplicated labor course as follows: Membrane Rupture Time/Date: 6:00 AM ,10/24/2020   Delivery Method:Vaginal, Spontaneous  Episiotomy:   Lacerations:  Labial;1st degree  Details of delivery can be found in separate delivery note.  Patient had a routine postpartum course. Patient is discharged home 10/27/20.  Newborn Data: Birth date:10/25/2020  Birth time:2:10 PM  Gender:Female  Living status:Living  Apgars:6 ,8  Weight:2534 g   Magnesium Sulfate received: No BMZ received: No Rhophylac:No MMR:No T-DaP:Given prenatally Flu: No Transfusion:No  Physical exam  Vitals:   10/26/20 0508 10/26/20 1215 10/26/20 1944 10/27/20 0508  BP: (!) 128/91 116/62 127/73 128/87  Pulse: (!) 59 71 62 (!) 57  Resp: 18 17 18 18   Temp: 97.8 F (36.6 C) 98.7 F (37.1 C) 98.3 F (36.8 C) 98.3 F (36.8 C)  TempSrc: Oral Oral Oral Oral  SpO2: 100% 100% 100% 100%    Patient Vitals for the past 24 hrs:  BP Temp  Temp src Pulse Resp SpO2  10/27/20 0508 128/87 98.3 F (36.8 C) Oral (!) 57 18 100 %  10/26/20 1944 127/73 98.3 F (36.8 C) Oral 62 18 100 %  10/26/20 1215 116/62 98.7 F (37.1 C) Oral 71 17 100 %      General: alert Lochia: appropriate Uterine Fundus: firm Incision: Dressing is clean, dry, and intact DVT Evaluation: No evidence of DVT seen on physical exam. Labs: Lab Results  Component Value Date   WBC 6.5 10/26/2020   HGB 7.0 (L) 10/26/2020   HCT 22.1 (L) 10/26/2020   MCV 87.0 10/26/2020   PLT 129 (L) 10/26/2020   CMP Latest Ref Rng & Units 04/17/2015  Glucose 65 - 99 mg/dL 102(H)  BUN 6 - 20 mg/dL 10  Creatinine 0.50 - 1.00 mg/dL 0.74  Sodium 135 - 145 mmol/L 137  Potassium 3.5 - 5.1 mmol/L 3.6  Chloride 101 - 111 mmol/L 103  CO2 22 - 32 mmol/L 25  Calcium 8.9 - 10.3 mg/dL 9.4  Total Protein 6.5 - 8.1 g/dL 8.3(H)  Total Bilirubin 0.3 - 1.2 mg/dL 0.6  Alkaline Phos 50 - 162 U/L 70  AST 15 - 41 U/L 25  ALT 14 - 54 U/L 13(L)   Edinburgh Score: Edinburgh Postnatal Depression Scale Screening Tool 10/26/2020  I have been able to laugh and see the funny side of things. 0  I have  looked forward with enjoyment to things. 0  I have blamed myself unnecessarily when things went wrong. 1  I have been anxious or worried for no good reason. 1  I have felt scared or panicky for no good reason. 0  Things have been getting on top of me. 0  I have been so unhappy that I have had difficulty sleeping. 0  I have felt sad or miserable. 1  I have been so unhappy that I have been crying. 0  The thought of harming myself has occurred to me. 0  Edinburgh Postnatal Depression Scale Total 3     After visit meds:  Allergies as of 10/27/2020   No Known Allergies     Medication List    STOP taking these medications   aspirin 81 MG EC tablet     TAKE these medications   cyclobenzaprine 10 MG tablet Commonly known as: FLEXERIL Take 1 tablet (10 mg total) by mouth every 8 (eight)  hours as needed for muscle spasms.   ferrous sulfate 325 (65 FE) MG tablet Take 1 tablet (325 mg total) by mouth every other day. Start taking on: October 29, 2020   ibuprofen 600 MG tablet Commonly known as: ADVIL Take 1 tablet (600 mg total) by mouth every 6 (six) hours.   Prenatal Plus Iron 29-1 MG Tabs Take 1 daily        Discharge home in stable condition Infant Feeding: Bottle and Breast Infant Disposition:home with mother Discharge instruction: per After Visit Summary and Postpartum booklet. Activity: Advance as tolerated. Pelvic rest for 6 weeks.  Diet: routine diet Future Appointments: Future Appointments  Date Time Provider [place]  12/05/2020 11:30 AM Myrtis Ser, CNM [facility]   Follow up Visit:   Please schedule this patient for a In person postpartum visit in 4 weeks with the following provider: Any provider. Additional Postpartum F/U:NA  High risk pregnancy complicated by: IUGR, JIA Delivery mode:  Vaginal, Spontaneous  Anticipated Birth Control:  POPs CBC this AM prior to DC    10/27/2020 Noni Saupe, NP

## 2020-10-25 NOTE — Progress Notes (Signed)
Caitlyn Green is a 20 y.o. G1P0000 at [redacted]w[redacted]d by ultrasound admitted for rupture of membranes  Subjective: Caitlyn Green is doing well. She is comfortable with epidural. She has no concerns or questions at this time.   Objective: BP (!) 126/93   Pulse 67   Temp 98.5 F (36.9 C) (Oral)   Resp 16   LMP 01/22/2020 (Approximate)   SpO2 100%  No intake/output data recorded. No intake/output data recorded.  FHT:  FHR: 145 bpm, variability: moderate,  accelerations:  Present,  decelerations:  Present intermittent variable decelerations UC:   Not tracing, palpating q3 mins SVE:   Dilation: 8 Effacement (%): 100 Station: 0 Exam by:: lee  Labs: Lab Results  Component Value Date   WBC 4.9 10/24/2020   HGB 8.9 (L) 10/24/2020   HCT 27.5 (L) 10/24/2020   MCV 87.6 10/24/2020   PLT 182 10/24/2020    Assessment / Plan: Augmentation of labor, progressing well  Labor: Progressing normally Preeclampsia:  n/a Fetal Wellbeing:  Category II Pain Control:  Epidural I/D:  GBS neg Anticipated MOD:  NSVD  Brand Males, CNM 10/25/2020, 1:38 PM

## 2020-10-25 NOTE — Anesthesia Preprocedure Evaluation (Signed)

## 2020-10-25 NOTE — Progress Notes (Signed)
LABOR PROGRESS NOTE  Caitlyn Green is a 20 y.o. G1P0000 at [redacted]w[redacted]d  admitted for IOL-FGR//PROM at ~0600 on 4/20.   Subjective: Doing well without complaints.  Objective: BP 126/82   Pulse (!) 59   Temp 98.5 F (36.9 C) (Oral)   Resp 16   LMP 01/22/2020 (Approximate)   SpO2 100%  or  Vitals:   10/24/20 2056 10/24/20 2202 10/25/20 0133 10/25/20 0252  BP: 120/82 125/82 118/78 126/82  Pulse: 81 62 72 (!) 59  Resp:   16   Temp:   98.5 F (36.9 C)   TempSrc:   Oral   SpO2:        Cervical Exam:  Dilation: 2 Effacement (%): 70 Cervical Position: Middle Station: 0 Presentation: Vertex Exam by:: Dr. Natasha Mead: baseline rate 130, moderate varibility, 15x15 acel, no decel Toco: 1-3 min   Labs: Lab Results  Component Value Date   WBC 4.9 10/24/2020   HGB 8.9 (L) 10/24/2020   HCT 27.5 (L) 10/24/2020   MCV 87.6 10/24/2020   PLT 182 10/24/2020    Patient Active Problem List   Diagnosis Date Noted  . Poor fetal growth affecting management of mother in third trimester, fetus 1 10/24/2020  . Poor fetal growth affecting management of mother in third trimester 10/18/2020  . Uterine size date discrepancy 08/31/2020  . Abnormal chromosomal and genetic finding on antenatal screening mother 05/21/2020  . Supervision of normal first pregnancy 04/26/2020  . 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 Asthma 04/05/2012    Assessment / Plan: 20 y.o. G1P0000 at [redacted]w[redacted]d here for IOL-FGR//PROM @0600  4/20.  Labor: s/p cytotec x3. Given cervical effacement will start low dose pitocin 2x2 up to 94mL/hr at this time and recheck in 4 hours. Fetal Wellbeing:  Cat 1 Pain Control:  Planning epidural  Anemia: admit hgb 8.9, asymptomatic, IV iron postpartum  11m, MD OB Fellow, Faculty Practice [facility], Center for [facility] 10/25/2020 2:53 AM

## 2020-10-25 NOTE — Progress Notes (Signed)
LABOR PROGRESS NOTE  Caitlyn Green is a 20 y.o. G1P0000 at [redacted]w[redacted]d  admitted for IOL-FGR//PROM at ~0600 on 4/20.   Subjective: Doing well, uncomfortable, awaiting epidural placement.  Objective: BP 137/83   Pulse 66   Temp 98.9 F (37.2 C) (Oral)   Resp 18   LMP 01/22/2020 (Approximate)   SpO2 100%  or  Vitals:   10/25/20 0600 10/25/20 0630 10/25/20 0642 10/25/20 0700  BP: 127/78 128/78  137/83  Pulse: 77 74  66  Resp:   18   Temp:      TempSrc:      SpO2:        Cervical Exam:  Dilation: 2 (to recheck cervix in 4 hrs to reassess plan per Dr. Germaine Pomfret) Effacement (%): 70 Cervical Position: Middle Station: 0 Presentation: Vertex Exam by:: Dr. Natasha Mead: baseline rate 140, moderate varibility, 15x15 acel, no decel Toco: 1-3 min   Labs: Lab Results  Component Value Date   WBC 4.9 10/24/2020   HGB 8.9 (L) 10/24/2020   HCT 27.5 (L) 10/24/2020   MCV 87.6 10/24/2020   PLT 182 10/24/2020    Patient Active Problem List   Diagnosis Date Noted  . Poor fetal growth affecting management of mother in third trimester, fetus 1 10/24/2020  . Poor fetal growth affecting management of mother in third trimester 10/18/2020  . Uterine size date discrepancy 08/31/2020  . Abnormal chromosomal and genetic finding on antenatal screening mother 05/21/2020  . Supervision of normal first pregnancy 04/26/2020  . 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 Asthma 04/05/2012    Assessment / Plan: 20 y.o. G1P0000 at [redacted]w[redacted]d here for IOL-FGR//PROM @0600  4/20.  Labor: s/p cytotec x3. Pitocin 2x2 up to 80mL/hr started @0300 . Will recheck after epidural placement and place IUPC. Fetal Wellbeing:  Cat 1 Pain Control:  Planning epidural  Anemia: admit hgb 8.9, asymptomatic, IV iron postpartum  11m, MD OB Fellow, Faculty Practice [facility], Center for [facility] 10/25/2020 7:09 AM

## 2020-10-25 NOTE — Lactation Note (Signed)
Lactation Consultation Note  Patient Name: Caitlyn Green [id number] Date: 10/25/2020 Reason for consult: L&D Initial assessment;Early term 37-38.6wks;Primapara;1st time breastfeeding Age:20 y.o.  [facility] Initial L&D Visit:  Attempted to visit with family  1 hour after delivery, however, RN informed me that baby had been grunting and he is STS with mother at this time.  Encouraged mother to continue STS and a lactation consult will be completed after arrival to the M/B unit. Allowed time for family bonding.   Maternal Data    Feeding Mother's Current Feeding Choice: Breast Milk  LATCH Score                    Lactation Tools Discussed/Used    Interventions    Discharge    Consult Status Consult Status: Follow-up Date: 10/25/20 Follow-up type: In-patient    Constance Whittle R Raynell Scott 10/25/2020, 3:07 PM

## 2020-10-26 LAB — CBC
HCT: 22.1 % — ABNORMAL LOW (ref 36.0–46.0)
Hemoglobin: 7 g/dL — ABNORMAL LOW (ref 12.0–15.0)
MCH: 27.6 pg (ref 26.0–34.0)
MCHC: 31.7 g/dL (ref 30.0–36.0)
MCV: 87 fL (ref 80.0–100.0)
Platelets: 129 10*3/uL — ABNORMAL LOW (ref 150–400)
RBC: 2.54 MIL/uL — ABNORMAL LOW (ref 3.87–5.11)
RDW: 13.7 % (ref 11.5–15.5)
WBC: 6.5 10*3/uL (ref 4.0–10.5)
nRBC: 0 % (ref 0.0–0.2)

## 2020-10-26 MED ORDER — SODIUM CHLORIDE 0.9 % IV SOLN: INTRAVENOUS | Status: AC | PRN

## 2020-10-26 MED ORDER — ASCORBIC ACID 500 MG PO TABS
250.0000 mg | ORAL_TABLET | ORAL | Status: DC
  Administered 2020-10-27: 250 mg via ORAL
  Filled 2020-10-26: qty 1

## 2020-10-26 MED ORDER — FERROUS SULFATE 325 (65 FE) MG PO TABS
325.0000 mg | ORAL_TABLET | ORAL | Status: DC
  Administered 2020-10-27: 325 mg via ORAL
  Filled 2020-10-26: qty 1

## 2020-10-26 NOTE — Progress Notes (Addendum)
Post Partum Day 1 Subjective:  Patient in bed just finished breastfeeding with infant on chest. Desires circumcision no complaints, up ad lib, voiding, tolerating PO and + flatus   Denies difficulty breathing or respiratory distress, chest pain, excessive vaginal bleeding, and leg pain or swelling.  Objective:  Blood pressure (!) 128/91, pulse (!) 59, temperature 97.8 F (36.6 C), temperature source Oral, resp. rate 18, last menstrual period 01/22/2020, SpO2 100 %.  Physical Exam:   General: alert, cooperative and no distress Lochia: appropriate Uterine Fundus: firm Incision: n/a DVT Evaluation: No evidence of DVT seen on physical exam. Negative Homan's sign. No cords or calf tenderness. No significant calf/ankle edema.  Recent Labs    10/24/20 1106 10/26/20 0528  HGB 8.9* 7.0*  HCT 27.5* 22.1*    Assessment:  65w2dG1P1 postpartum, lactating mother, anemia  Plan:  IV iron to be infused today, see orders  Plan for discharge tomorrow  Breastfeeding  Desires circumcision prior to discharge  Contraception POPs  [place] SLane4/22/2022, 6:58 AM   I personally saw and evaluated the patient, performing the key elements of the service. I developed and verified the management plan that is described in the resident's/student's note, and I agree with the content with my edits above. VSS, HRR&R, Resp unlabored, Legs neg.  FNigel Berthold CNM 10/26/2020 8:13 AM

## 2020-10-26 NOTE — Lactation Note (Signed)
This note was copied from a baby's chart. Lactation Consultation Note  Patient Name: Caitlyn Green Date: 10/26/2020 Reason for consult: Follow-up assessment;Mother's request;Primapara;1st time breastfeeding;Early term 37-38.6wks;Infant < 6lbs Age:20 hours  Infant not latching well today according to Mom. Infant had pacifier in his mouth, they just started using. Infant also in an outfit. LC reviewed holding off use of pacifier for the first 4 weeks. LC also placed infant STS and latched in football, assisting with transferring to opposite side. Infant able to latch with signs of milk transfer for 18 minutes.   Infant does extend the tongue pass the gum line.   LC reviewed how to reduce calorie loss for LPTI given infant under 6 lbs. Parents aware to offer both breasts, supplement based on the LPTI guidelines offer EBM then formula.   Dad paced bottle fed with slow flow nipple Similac with Iron 22 cal/oz 4 ml before he feel asleep.   Parents are aware to keep total feeding time under 30 minutes.   LC reviewed feeding results listed above with RN on duty.  Parents are aware to increase supplementation volume 10-20 ml based on hrs of age since birth.  Mom assessed for flange size an increased from 24 to 27.  Mom to follow up with pumping q 3 hrs for 15 minutes.   Maternal Data    Feeding Mother's Current Feeding Choice: Breast Milk and Formula Nipple Type: Slow - flow  LATCH Score Latch: Repeated attempts needed to sustain latch, nipple held in mouth throughout feeding, stimulation needed to elicit sucking reflex.  Audible Swallowing: Spontaneous and intermittent  Type of Nipple: Everted at rest and after stimulation  Comfort (Breast/Nipple): Soft / non-tender  Hold (Positioning): Assistance needed to correctly position infant at breast and maintain latch.  LATCH Score: 8   Lactation Tools Discussed/Used Tools: Flanges;Pump Flange Size: 27 Breast pump type:  Double-Electric Breast Pump Pump Education: Setup, frequency, and cleaning;Milk Storage Reason for Pumping: increase stimulation Pumping frequency: every 3 hrs for 15 minutes  Interventions Interventions: Breast feeding basics reviewed;Support pillows;Assisted with latch;Position options;Skin to skin;Expressed milk;Breast massage;DEBP;Breast compression;Adjust position;Education  Discharge    Consult Status Consult Status: Follow-up Date: 10/27/20 Follow-up type: In-patient    Caitlyn Green  Caitlyn Green 10/26/2020, 7:37 PM

## 2020-10-26 NOTE — Anesthesia Postprocedure Evaluation (Signed)
Anesthesia Post Note  Patient: Caitlyn Green  Procedure(s) Performed: AN AD HOC LABOR EPIDURAL     Patient location during evaluation: Mother Baby Anesthesia Type: Epidural Level of consciousness: awake and alert and oriented Pain management: satisfactory to patient Vital Signs Assessment: post-procedure vital signs reviewed and stable Respiratory status: respiratory function stable Cardiovascular status: stable Postop Assessment: no headache, no backache, epidural receding, patient able to bend at knees, no signs of nausea or vomiting, adequate PO intake and able to ambulate Anesthetic complications: no   No complications documented.  Last Vitals:  Vitals:   10/26/20 0100 10/26/20 0508  BP: (!) 107/55 (!) 128/91  Pulse: 71 (!) 59  Resp: 18 18  Temp: 36.6 C 36.6 C  SpO2: 100% 100%    Last Pain:  Vitals:   10/26/20 0717  TempSrc:   PainSc: Asleep   Pain Goal:                   Karleen Dolphin

## 2020-10-27 ENCOUNTER — Encounter (HOSPITAL_COMMUNITY): Payer: Self-pay

## 2020-10-27 LAB — CBC
HCT: 25 % — ABNORMAL LOW (ref 36.0–46.0)
Hemoglobin: 8.2 g/dL — ABNORMAL LOW (ref 12.0–15.0)
MCH: 28.6 pg (ref 26.0–34.0)
MCHC: 32.8 g/dL (ref 30.0–36.0)
MCV: 87.1 fL (ref 80.0–100.0)
Platelets: 173 10*3/uL (ref 150–400)
RBC: 2.87 MIL/uL — ABNORMAL LOW (ref 3.87–5.11)
RDW: 13.9 % (ref 11.5–15.5)
WBC: 6.7 10*3/uL (ref 4.0–10.5)
nRBC: 0 % (ref 0.0–0.2)

## 2020-10-27 MED ORDER — IBUPROFEN 600 MG PO TABS: 600.0000 mg | ORAL_TABLET | Freq: Four times a day (QID) | ORAL | 0 refills | Status: DC

## 2020-10-27 MED ORDER — FERROUS SULFATE 325 (65 FE) MG PO TABS: 325.0000 mg | ORAL_TABLET | ORAL | 3 refills | Status: DC

## 2020-10-27 MED ORDER — NORETHINDRONE 0.35 MG PO TABS: 1.0000 | ORAL_TABLET | Freq: Every day | ORAL | 11 refills | Status: DC

## 2020-10-27 NOTE — Lactation Note (Signed)
This note was copied from a baby's chart. Lactation Consultation Note  Patient Name: Caitlyn Green [id number] Date: 10/27/2020 Reason for consult: Primapara;1st time breastfeeding;Early term 37-38.6wks;Infant < 6lbs;Infant weight loss;Other (Comment) (6 % weight loss, per mom has a DEBP at home ( Medela ) and plans to just pump and bottle feed. Supply and demand reviewed - pumping at least 8-10 times a day both breast. see below D/C teaching.) Age:20 hours  Per mom the baby recently fed 15 ml. LC stressed the importance since she planning on pumping and bottle feeding - the volume baby needs to take for his age is 30 ml and working up towards 45 ml by 7 days every feeding.  LC reviewed S/S of mastitis.  Mom has the [facility] brochure with resource numbers.    Maternal Data    Feeding Mother's Current Feeding Choice: Breast Milk and Formula Nipple Type: Slow - flow  LATCH Score                    Lactation Tools Discussed/Used Tools: Pump Flange Size: 24;27 Breast pump type: Double-Electric Breast Pump Pump Education: Milk Storage  Interventions    Discharge Discharge Education: Engorgement and breast care Pump: Personal;DEBP  Consult Status Consult Status: Complete Date: 10/27/20    Kathrin Greathouse 10/27/2020, 1:53 PM

## 2020-10-29 LAB — SURGICAL PATHOLOGY

## 2020-11-28 IMAGING — DX DG ANKLE COMPLETE 3+V*R*
3 series · 4 of 4 positions shown · non-contrast
Comparison: 08/22/2017

CLINICAL DATA: Trauma 2 days ago, increasing pain and swelling

EXAM:
RIGHT ANKLE - COMPLETE 3+ VIEW; RIGHT FOOT COMPLETE - 3+ VIEW

[ankle ap]
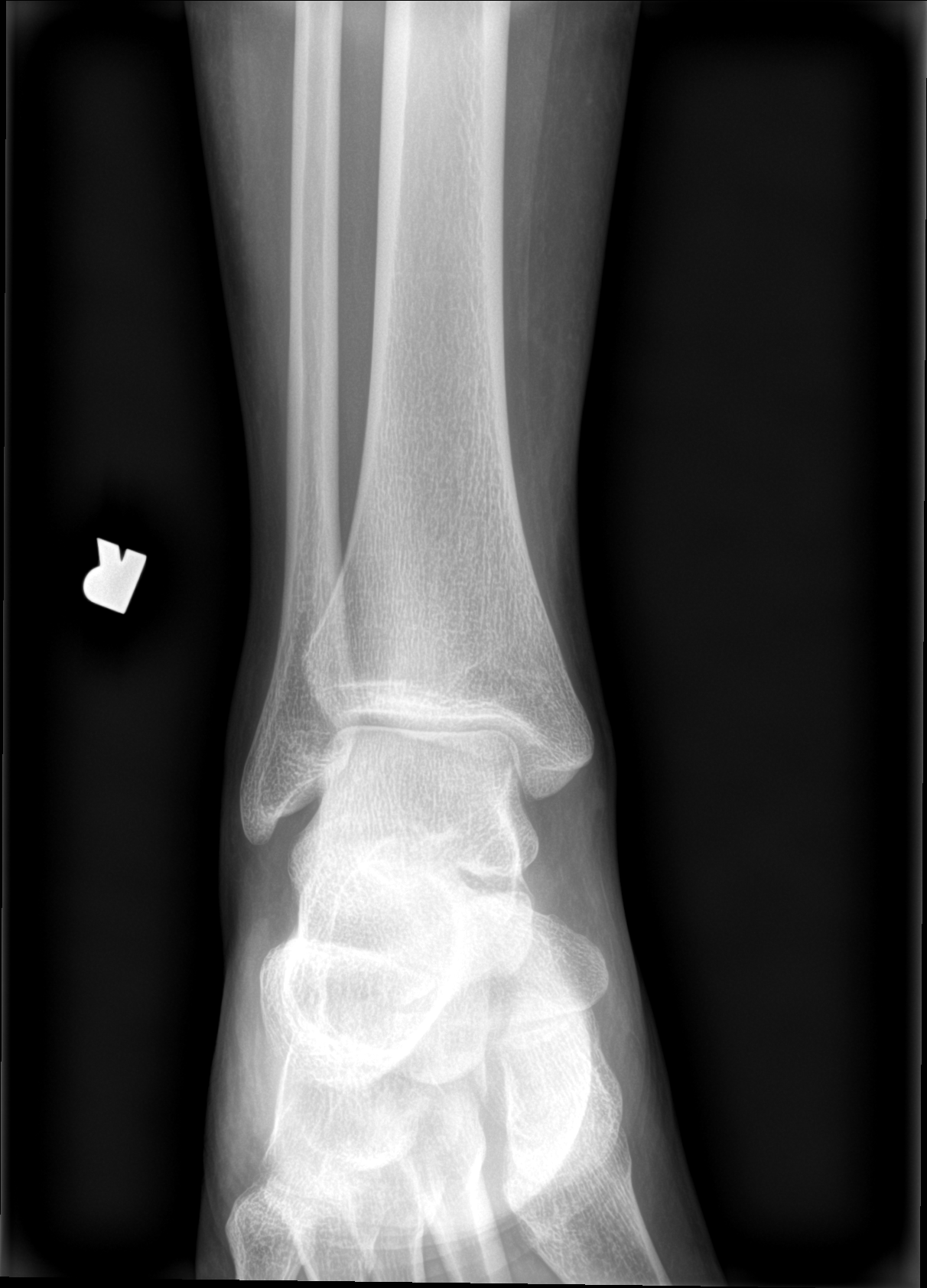

[Series 2: ankle obl · 0.14mm/px · 2 of 2 slices shown]
[im 1/2]
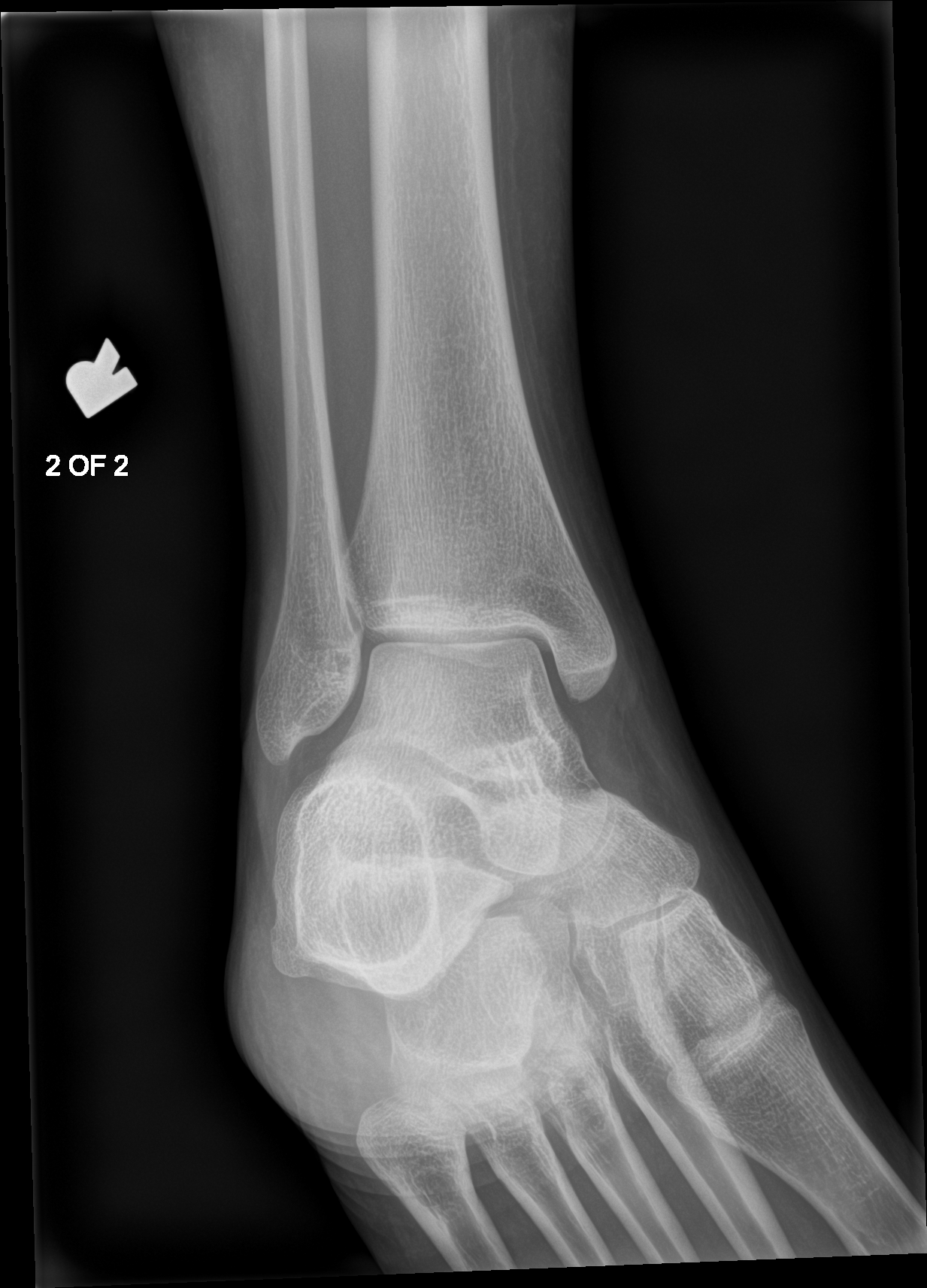
[im 2/2]
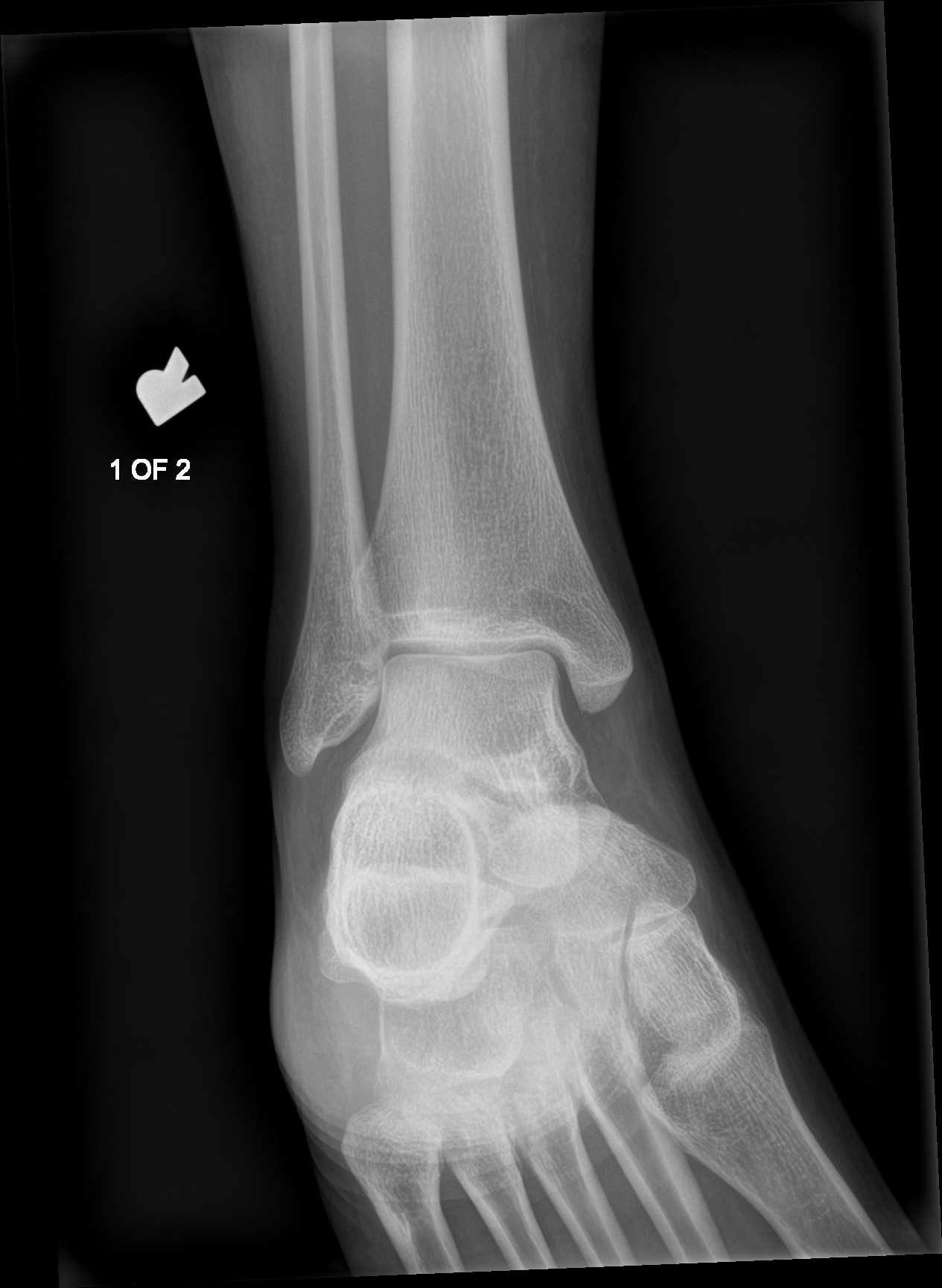

[ankle lat]
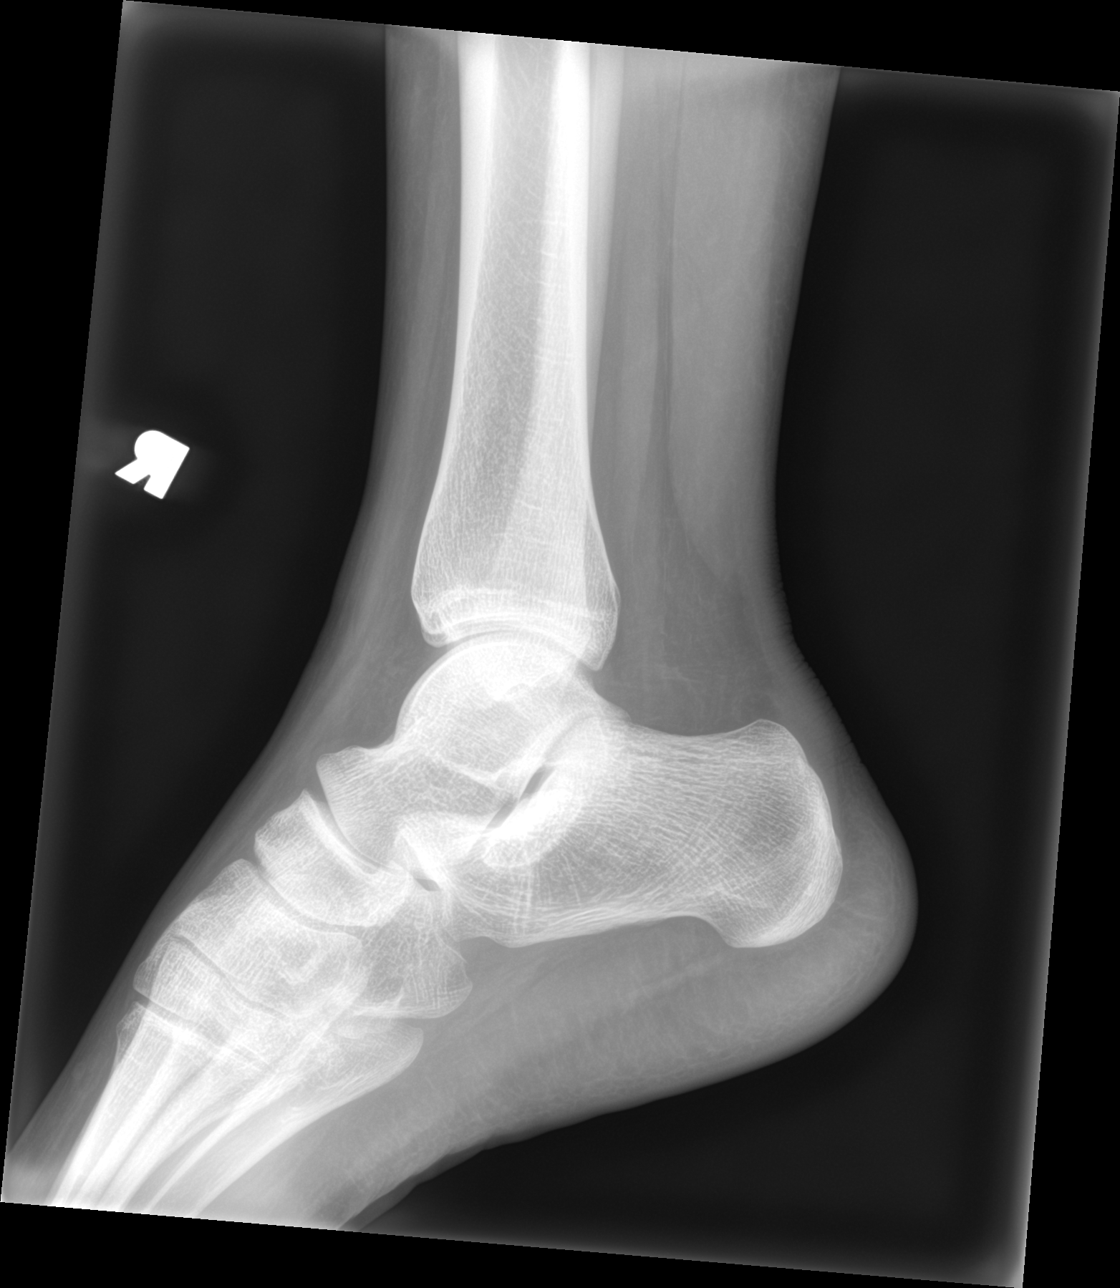

[4 of 4 positions shown; findings below may reference images not displayed]

FINDINGS: Right ankle: Frontal, oblique, lateral views of the right ankle
demonstrate no fractures. Alignment is anatomic. Joint spaces are
well preserved. Soft tissues are normal.

Right foot: Frontal, oblique, and lateral views demonstrate no
fractures. Alignment is anatomic. Joint spaces are well preserved.
Soft tissues are normal.
IMPRESSION: 1. Unremarkable right foot and ankle.

## 2020-12-05 ENCOUNTER — Other Ambulatory Visit: Payer: Self-pay

## 2020-12-05 ENCOUNTER — Ambulatory Visit (INDEPENDENT_AMBULATORY_CARE_PROVIDER_SITE_OTHER): Payer: BC Managed Care – PPO | Admitting: Advanced Practice Midwife

## 2020-12-05 ENCOUNTER — Encounter: Payer: Self-pay | Admitting: Advanced Practice Midwife

## 2020-12-05 MED ORDER — NORETHIN ACE-ETH ESTRAD-FE 1-20 MG-MCG PO TABS: 1.0000 | ORAL_TABLET | Freq: Every day | ORAL | 11 refills | Status: DC

## 2020-12-05 NOTE — Progress Notes (Signed)
POSTPARTUM VISIT Patient name: Caitlyn Green MRN [id number]  Date of birth: [birth year] Chief Complaint:   Postpartum Care  History of Present Illness:   Caitlyn Green is a 20 y.o. G34P0000 African American female being seen today for a postpartum visit. She is 6 weeks postpartum following a spontaneous vaginal delivery at 38.1 gestational weeks. IOL: yes, for PROM, but she was scheduled that same morning for IOL due to [place] 9.8% with borderline AFI. Anesthesia: local and epidural.  Laceration: L labial.  Complications: none. Inpatient contraception: yes , started POPs shortly after delivery.   Pregnancy complicated by FGR with borderline fluid. Tobacco use: no. Substance use disorder: no. Last pap smear: <21yo. Next pap smear due: May 2023 No LMP recorded.  Postpartum course has been uncomplicated. Bleeding scant staining. Bowel function is normal. Bladder function is normal. Urinary incontinence? no, fecal incontinence? no Patient is not sexually active. Last sexual activity: prior to birth of baby. Desired contraception: OCPs. Patient does want a pregnancy in the future.  Desired family size is unsure # of children.   The pregnancy intention screening data noted above was reviewed. Potential methods of contraception were discussed. The patient elected to proceed with Oral Contraceptive.   Edinburgh Postpartum Depression Screening: negative  Edinburgh Postnatal Depression Scale - 12/05/20 1150      Edinburgh Postnatal Depression Scale:  In the Past 7 Days   I have been able to laugh and see the funny side of things. 0    I have looked forward with enjoyment to things. 0    I have blamed myself unnecessarily when things went wrong. 1    I have been anxious or worried for no good reason. 0    I have felt scared or panicky for no good reason. 0    Things have been getting on top of me. 1    I have been so unhappy that I have had difficulty sleeping. 0    I have felt sad or miserable. 0     I have been so unhappy that I have been crying. 0    The thought of harming myself has occurred to me. 0    Edinburgh Postnatal Depression Scale Total 2           GAD 7 : Generalized Anxiety Score 04/30/2020 03/13/2020  Nervous, Anxious, on Edge 0 1  Control/stop worrying 0 0  Worry too much - different things 0 1  Trouble relaxing 0 0  Restless 0 0  Easily annoyed or irritable 2 1  Afraid - awful might happen 0 0  Total GAD 7 Score 2 3  Anxiety Difficulty - Not difficult at all     Baby's course has been uncomplicated. Baby is feeding by bottle. Infant has a pediatrician/family doctor? Yes.  Childcare strategy if returning to work/school: family.  Pt has material needs met for her and baby: Yes.   Review of Systems:   Pertinent items are noted in HPI Denies Abnormal vaginal discharge w/ itching/odor/irritation, headaches, visual changes, shortness of breath, chest pain, abdominal pain, severe nausea/vomiting, or problems with urination or bowel movements. Pertinent History Reviewed:  Reviewed past medical,surgical, obstetrical and family history.  Reviewed problem list, medications and allergies. OB History  Gravida Para Term Preterm AB Living  1 0 0 0 0 0  SAB IAB Ectopic Multiple Live Births  0 0 0 0 0    # Outcome Date GA Lbr Len/2nd Weight Sex Delivery Anes PTL Lv  1  Gravida            Physical Assessment:   Vitals:   12/05/20 1145  BP: 113/67  Pulse: 94  Weight: 140 lb (63.5 kg)  Height: 5' 11"  (1.803 m)  Body mass index is 19.53 kg/m.       Physical Examination:   General appearance: alert, well appearing, and in no distress  Mental status: alert, oriented to person, place, and time  Skin: warm & dry   Cardiovascular: normal heart rate noted   Respiratory: normal respiratory effort, no distress   Breasts: deferred, no complaints   Abdomen: soft, non-tender   Pelvic: normal external genitalia, vulva, vagina, cervix, uterus and adnexa. Thin prep pap  obtained: No; L labia with sm 'slit' between upper/lower portion- healed  Rectal: not examined  Extremities: Edema: none         No results found for this or any previous visit (from the past 24 hour(s)).  Assessment & Plan:  1) Postpartum exam 2) Six wks s/p spontaneous vaginal delivery 3) bottle feeding 4) Depression screening>  negative 5) Contraception management: is on POPS; sent in [place] after reviewing increased effectiveness of COCs if not breastfeeding; to finish current pack of POPs and then start COCs the next day  Essential components of care per ACOG recommendations:  1.  Mood and well being:  . If positive depression screen, discussed and plan developed.  . If using tobacco we discussed reduction/cessation and risk of relapse . If current substance abuse, we discussed and referral to local resources was offered.   2. Infant care and feeding:  . If breastfeeding, discussed returning to work, pumping, breastfeeding-associated pain, guidance regarding return to fertility while lactating if not using another method. If needed, patient was provided with a letter to be allowed to pump q 2-3hrs to support lactation in a private location with access to a refrigerator to store breastmilk.   . Recommended that all caregivers be immunized for flu, pertussis and other preventable communicable diseases . If pt does not have material needs met for her/baby, referred to local resources for help obtaining these.  3. Sexuality, contraception and birth spacing . Provided guidance regarding sexuality, management of dyspareunia, and resumption of intercourse . Discussed avoiding interpregnancy interval <41mhs and recommended birth spacing of 18 months  4. Sleep and fatigue . Discussed coping options for fatigue and sleep disruption . Encouraged family/partner/community support of 4 hrs of uninterrupted sleep to help with mood and fatigue  5. Physical recovery  . If pt had a C/S, assessed  incisional pain and providing guidance on normal vs prolonged recovery . If pt had a laceration, perineal healing and pain reviewed.  . If urinary or fecal incontinence, discussed management and referred to PT or uro/gyn if indicated  . Patient is safe to resume physical activity. Discussed attainment of healthy weight.  6.  Chronic disease management . Discussed pregnancy complications if any, and their implications for future childbearing and long-term maternal health. . Review recommendations for prevention of recurrent pregnancy complications, such as 17 hydroxyprogesterone caproate to reduce risk for recurrent PTB not applicable, or aspirin to reduce risk of preeclampsia not applicable. . Pt had GDM: no. If yes, 2hr GTT scheduled: not applicable. . Reviewed medications and non-pregnant dosing including consideration of whether pt is breastfeeding using a reliable resource such as LactMed: not applicable . Referred for f/u w/ PCP or subspecialist providers as indicated: not applicable  7. Health maintenance . Mammogram at [id number]  earlier if indicated . Pap smears as indicated  Meds:  Meds ordered this encounter  Medications  . norethindrone-ethinyl estradiol-FE (JUNEL FE 1/20) 1-20 MG-MCG tablet    Sig: Take 1 tablet by mouth daily.    Dispense:  28 tablet    Refill:  11    Order Specific Question:   Supervising Provider    Answer:   Florian Buff [[id number]]    Follow-up: Return in about 1 year (around 12/05/2021) for Pap & Physical.   No orders of the defined types were placed in this encounter.   Myrtis Ser CNM 12/05/2020 12:14 PM

## 2021-01-24 ENCOUNTER — Emergency Department (HOSPITAL_COMMUNITY)
Admission: EM | Admit: 2021-01-24 | Discharge: 2021-01-25 | Disposition: A | Discharge status: Home / Self Care | Payer: BC Managed Care – PPO | Attending: Emergency Medicine | Admitting: Emergency Medicine

## 2021-01-24 ENCOUNTER — Encounter (HOSPITAL_COMMUNITY): Payer: Self-pay

## 2021-01-24 ENCOUNTER — Other Ambulatory Visit: Payer: Self-pay

## 2021-01-24 ENCOUNTER — Emergency Department (HOSPITAL_COMMUNITY): Payer: BC Managed Care – PPO

## 2021-01-24 DIAGNOSIS — M7989 Other specified soft tissue disorders: Secondary | ICD-10-CM | POA: Diagnosis not present

## 2021-01-24 DIAGNOSIS — W228XXA Striking against or struck by other objects, initial encounter: Secondary | ICD-10-CM | POA: Insufficient documentation

## 2021-01-24 DIAGNOSIS — M25571 Pain in right ankle and joints of right foot: Secondary | ICD-10-CM | POA: Insufficient documentation

## 2021-01-24 NOTE — ED Triage Notes (Signed)
Pt reports hitting right media ankle on door yesterday. No deformity, no redness, no swelling, skin intact.

## 2021-01-24 NOTE — ED Provider Notes (Signed)
[facility]-EMERGENCY DEPT [facility] Emergency Department Provider Note MRN:  [id number]  Arrival date & time: 01/24/21     Chief Complaint   Ankle Pain (Hit foot on door)   History of Present Illness   Caitlyn Green is a 20 y.o. year-old female with a history of rheumatoid arthritis presenting to the ED with chief complaint of ankle pain.  Patient try to open a door with her foot and the door struck her on the right ankle, medial aspect of the malleolus.  Having pain and tenderness since, seems to be worsening throughout the day.  Worse with palpation or movements.  Denies any other injuries.  Review of Systems  A problem-focused ROS was performed. Positive for ankle pain.  Patient denies other injuries.  Patient's Health History    Past Medical History:  Diagnosis Date   Back strain    Rheumatoid arthritis (HCC)     Past Surgical History:  Procedure Laterality Date   NO PAST SURGERIES      Family History  Problem Relation Age of Onset   Healthy Mother    Cancer Father    Arthritis Maternal Grandmother    Healthy Sister    Healthy Sister    Healthy Brother     Social History   Socioeconomic History   Marital status: Single    Spouse name: Not on file   Number of children: Not on file   Years of education: Not on file   Highest education level: Not on file  Occupational History   Occupation: [employer]   Tobacco Use   Smoking status: Never   Smokeless tobacco: Never  Vaping Use   Vaping Use: Former  Substance and Sexual Activity   Alcohol use: No   Drug use: No   Sexual activity: Yes    Birth control/protection: None  Other Topics Concern   Not on file  Social History Narrative   Not on file   Social Determinants of Health   Financial Resource Strain: Low Risk    Difficulty of Paying Living Expenses: Not very hard  Food Insecurity: Food Insecurity Present   Worried About Running Out of Food in the Last Year: Sometimes true   Ran Out of Food  in the Last Year: Never true  Transportation Needs: No Transportation Needs   Lack of Transportation (Medical): No   Lack of Transportation (Non-Medical): No  Physical Activity: Inactive   Days of Exercise per Week: 5 days   Minutes of Exercise per Session: 0 min  Stress: No Stress Concern Present   Feeling of Stress : Only a little  Social Connections: Moderately Integrated   Frequency of Communication with Friends and Family: More than three times a week   Frequency of Social Gatherings with Friends and Family: Three times a week   Attends Religious Services: More than 4 times per year   Active Member of Clubs or Organizations: No   Attends Banker Meetings: Never   Marital Status: Living with partner  Intimate Partner Violence: Not At Risk   Fear of Current or Ex-Partner: No   Emotionally Abused: No   Physically Abused: No   Sexually Abused: No     Physical Exam   Vitals:   01/24/21 2128  BP: 116/84  Pulse: 81  Resp: 14  Temp: 97.9 F (36.6 C)  SpO2: 99%    CONSTITUTIONAL: Well-appearing, NAD NEURO:  Alert and oriented x 3, no focal deficits EYES:  eyes equal and reactive ENT/NECK:  no LAD, no JVD CARDIO: Regular rate, well-perfused, normal S1 and S2 PULM:  CTAB no wheezing or rhonchi GI/GU:  normal bowel sounds, non-distended, non-tender MSK/SPINE:  No gross deformities, no edema SKIN:  no rash, atraumatic PSYCH:  Appropriate speech and behavior  *Additional and/or pertinent findings included in MDM below  Diagnostic and Interventional Summary    EKG Interpretation  Date/Time:    Ventricular Rate:    PR Interval:    QRS Duration:   QT Interval:    QTC Calculation:   R Axis:     Text Interpretation:         Labs Reviewed - No data to display  DG Ankle Complete Right  Final Result      Medications - No data to display   Procedures  /  Critical Care Procedures  ED Course and Medical Decision Making  I have reviewed the triage  vital signs, the nursing notes, and pertinent available records from the EMR.  Listed above are laboratory and imaging tests that I personally ordered, reviewed, and interpreted and then considered in my medical decision making (see below for details).  Minimal ankle trauma, x-ray negative, neurovascular intact, no real evidence of trauma on exam, appropriate for discharge.       Elmer Sow. Pilar Plate, MD [facility] Emergency Medicine [facility] [email or web address]  Final Clinical Impressions(s) / ED Diagnoses     ICD-10-CM   1. Acute right ankle pain  M25.571       ED Discharge Orders     None        Discharge Instructions Discussed with and Provided to Patient:    Discharge Instructions      You were evaluated in the Emergency Department and after careful evaluation, we did not find any emergent condition requiring admission or further testing in the hospital.  Your exam/testing today was overall reassuring.  Symptoms seem to be due to bruising or inflammation from the trauma.  Your x-ray was normal.  Recommend Tylenol or Motrin for discomfort.  Can use the brace as needed for comfort.  Please return to the Emergency Department if you experience any worsening of your condition.  Thank you for allowing [place] to be a part of your care.        Sabas Sous, MD 01/24/21 [phone]

## 2021-01-24 NOTE — Discharge Instructions (Addendum)
You were evaluated in the Emergency Department and after careful evaluation, we did not find any emergent condition requiring admission or further testing in the hospital.  Your exam/testing today was overall reassuring.  Symptoms seem to be due to bruising or inflammation from the trauma.  Your x-ray was normal.  Recommend Tylenol or Motrin for discomfort.  Can use the brace as needed for comfort.  Please return to the Emergency Department if you experience any worsening of your condition.  Thank you for allowing [place] to be a part of your care.

## 2021-01-25 DIAGNOSIS — M25571 Pain in right ankle and joints of right foot: Secondary | ICD-10-CM | POA: Diagnosis not present

## 2021-02-11 ENCOUNTER — Encounter (HOSPITAL_COMMUNITY): Payer: Self-pay | Admitting: *Deleted

## 2021-02-11 ENCOUNTER — Other Ambulatory Visit: Payer: Self-pay

## 2021-02-11 ENCOUNTER — Emergency Department (HOSPITAL_COMMUNITY)
Admission: EM | Admit: 2021-02-11 | Discharge: 2021-02-11 | Disposition: A | Discharge status: Home / Self Care | Payer: BC Managed Care – PPO | Attending: Emergency Medicine | Admitting: Emergency Medicine

## 2021-02-11 DIAGNOSIS — J069 Acute upper respiratory infection, unspecified: Secondary | ICD-10-CM

## 2021-02-11 DIAGNOSIS — J45909 Unspecified asthma, uncomplicated: Secondary | ICD-10-CM | POA: Insufficient documentation

## 2021-02-11 DIAGNOSIS — R519 Headache, unspecified: Secondary | ICD-10-CM | POA: Diagnosis not present

## 2021-02-11 DIAGNOSIS — B9789 Other viral agents as the cause of diseases classified elsewhere: Secondary | ICD-10-CM | POA: Diagnosis not present

## 2021-02-11 DIAGNOSIS — U071 COVID-19: Secondary | ICD-10-CM | POA: Diagnosis not present

## 2021-02-11 NOTE — ED Triage Notes (Signed)
Pt states she started with a headache and sore throat yesterday and took a covid test today that came back positive

## 2021-02-11 NOTE — Discharge Instructions (Addendum)
Home to rest.  Continue to wear your mask while around [place]. Motrin and Tylenol as needed as directed for fever and body aches. Call your primary care provider/rheumatologist in the morning to discuss if Paxlovid would be beneficial.

## 2021-02-11 NOTE — ED Provider Notes (Signed)
[facility] EMERGENCY DEPARTMENT Provider Note   CSN: [id number] Arrival date & time: 02/11/21  2042     History Chief Complaint  Patient presents with   Sore Throat    Caitlyn Green is a 20 y.o. female.  20 year old female presents with complaint of sore throat and headache onset yesterday, had a positive COVID test at home.  Patient generally feels well and has no complaints or concerns otherwise.  She does have history of rheumatoid arthritis, is not on DMARDs at this time.  Here primarily for COVID test for her child.  No other complaints or concerns.  Caitlyn Green was evaluated in Emergency Department on 02/11/2021 for the symptoms described in the history of present illness. She was evaluated in the context of the global COVID-19 pandemic, which necessitated consideration that the patient might be at risk for infection with the SARS-CoV-2 virus that causes COVID-19. Institutional protocols and algorithms that pertain to the evaluation of patients at risk for COVID-19 are in a state of rapid change based on information released by regulatory bodies including the CDC and federal and state organizations. These policies and algorithms were followed during the patient's care in the ED.       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   Asthma 04/05/2012    Past Surgical History:  Procedure Laterality Date   NO PAST SURGERIES       OB History     Gravida  1   Para  0   Term  0   Preterm  0   AB  0   Living  0      SAB  0   IAB  0   Ectopic  0   Multiple  0   Live Births  0           Family History  Problem Relation Age of Onset   Healthy Mother    Cancer Father    Arthritis Maternal Grandmother    Healthy Sister    Healthy Sister    Healthy Brother     Social History   Tobacco Use    Smoking status: Never   Smokeless tobacco: Never  Vaping Use   Vaping Use: Former  Substance Use Topics   Alcohol use: No   Drug use: No    Home Medications Prior to Admission medications   Medication Sig Start Date End Date Taking? Authorizing Provider  ferrous sulfate 325 (65 FE) MG tablet Take 1 tablet (325 mg total) by mouth every other day. 10/29/20   Rasch, Victorino Dike I, NP  norethindrone-ethinyl estradiol-FE (JUNEL FE 1/20) 1-20 MG-MCG tablet Take 1 tablet by mouth daily. 12/05/20   Arabella Merles, CNM  Prenatal Vit-Iron Carbonyl-FA (PRENATAL PLUS IRON) 29-1 MG TABS Take 1 daily Patient not taking: No sig reported 03/13/20   Adline Potter, NP    Allergies    Patient has no known allergies.  Review of Systems   Review of Systems  Constitutional:  Negative for chills and fever.  HENT:  Positive for sore throat. Negative for congestion.   Respiratory:  Negative for cough and shortness of breath.   Cardiovascular:  Negative for chest pain.  Gastrointestinal:  Negative for diarrhea, nausea and vomiting.  Musculoskeletal:  Negative for back pain and myalgias.  Skin:  Negative  for rash and wound.  Allergic/Immunologic: Positive for immunocompromised state.  Neurological:  Positive for headaches. Negative for weakness.  Hematological:  Negative for adenopathy.  Psychiatric/Behavioral:  Negative for confusion.   All other systems reviewed and are negative.  Physical Exam Updated Vital Signs BP 120/76 (BP Location: Right Arm)   Pulse 77   Temp 98.9 F (37.2 C) (Oral)   Resp 16   Ht 5\' 11"  (1.803 m)   Wt 61.2 kg   LMP 01/17/2021 (Approximate)   SpO2 100%   BMI 18.83 kg/m   Physical Exam Vitals and nursing note reviewed.  Constitutional:      General: She is not in acute distress.    Appearance: She is well-developed. She is not diaphoretic.  HENT:     Head: Normocephalic and atraumatic.     Right Ear: Tympanic membrane and ear canal normal.     Left Ear:  Tympanic membrane and ear canal normal.  Eyes:     Conjunctiva/sclera: Conjunctivae normal.  Cardiovascular:     Rate and Rhythm: Normal rate and regular rhythm.     Heart sounds: Normal heart sounds.  Pulmonary:     Effort: Pulmonary effort is normal.  Musculoskeletal:     Cervical back: Neck supple.  Lymphadenopathy:     Cervical: No cervical adenopathy.  Skin:    General: Skin is warm and dry.     Findings: No erythema or rash.  Neurological:     Mental Status: She is alert and oriented to person, place, and time.  Psychiatric:        Behavior: Behavior normal.    ED Results / Procedures / Treatments   Labs (all labs ordered are listed, but only abnormal results are displayed) Labs Reviewed - No data to display  EKG None  Radiology No results found.  Procedures Procedures   Medications Ordered in ED Medications - No data to display  ED Course  I have reviewed the triage vital signs and the nursing notes.  Pertinent labs & imaging results that were available during my care of the patient were reviewed by me and considered in my medical decision making (see chart for details).  Clinical Course as of 02/11/21 2228  Mon Feb 11, 2021  3173 20 year old female with positive COVID test at home.  Well-appearing, exam unremarkable.  Offered Paxil bed, patient prefers to defer conversation to PCP/rheumatology.  Advised that this medication is most effective if started within the first 3 days. [LM]    Clinical Course User Index [LM] 26   MDM Rules/Calculators/A&P                           Final Clinical Impression(s) / ED Diagnoses Final diagnoses:  Viral URI with cough    Rx / DC Orders ED Discharge Orders     None        Alden Hipp 02/11/21 2228    2229, MD 02/12/21 [phone]

## 2021-04-08 ENCOUNTER — Ambulatory Visit (INDEPENDENT_AMBULATORY_CARE_PROVIDER_SITE_OTHER): Payer: BC Managed Care – PPO | Admitting: *Deleted

## 2021-04-08 ENCOUNTER — Other Ambulatory Visit: Payer: Self-pay

## 2021-04-08 ENCOUNTER — Encounter: Payer: Self-pay | Admitting: *Deleted

## 2021-04-08 VITALS — BP 118/75 | HR 100

## 2021-04-08 DIAGNOSIS — Z3201 Encounter for pregnancy test, result positive: Secondary | ICD-10-CM | POA: Diagnosis not present

## 2021-04-08 DIAGNOSIS — N926 Irregular menstruation, unspecified: Secondary | ICD-10-CM

## 2021-04-08 LAB — POCT URINE PREGNANCY: Preg Test, Ur: POSITIVE — AB

## 2021-04-08 MED ORDER — PRENATAL PLUS 27-1 MG PO TABS
1.0000 | ORAL_TABLET | Freq: Every day | ORAL | 12 refills | Status: DC
Start: 2021-04-08 — End: 2022-01-08

## 2021-04-08 MED ORDER — PROMETHAZINE HCL 25 MG PO TABS
25.0000 mg | ORAL_TABLET | Freq: Four times a day (QID) | ORAL | 1 refills | Status: DC | PRN
Start: 2021-04-08 — End: 2021-11-28

## 2021-04-08 NOTE — Progress Notes (Signed)
   NURSE VISIT- PREGNANCY CONFIRMATION   SUBJECTIVE:  Caitlyn Green is a 20 y.o. G2P0000 female at [redacted]w[redacted]d by uncertain LMP of Patient's last menstrual period was 03/07/2021 (approximate). Here for pregnancy confirmation.  Home pregnancy test: positive x 2   She reports nausea.  She is not taking prenatal vitamins.    OBJECTIVE:  BP 118/75 (BP Location: Left Arm, Patient Position: Sitting, Cuff Size: Normal)   Pulse 100   LMP 03/07/2021 (Approximate)   Appears well, in no apparent distress  Results for orders placed or performed in visit on 04/08/21 (from the past 24 hour(s))  POCT urine pregnancy   Collection Time: 04/08/21  2:36 PM  Result Value Ref Range   Preg Test, Ur Positive (A) Negative    ASSESSMENT: Positive pregnancy test, [redacted]w[redacted]d by LMP    PLAN: Schedule for dating ultrasound in 3-4 weeks Prenatal vitamins: note routed to [place] to send prescription   Nausea medicines: requested-note routed to [place] to send prescription   OB packet given: Yes  Annamarie Dawley  04/08/2021 2:38 PM

## 2021-04-08 NOTE — Progress Notes (Signed)
Chart reviewed for nurse visit. Agree with plan of care. Rx sent for PNV and phenergan. Adline Potter, NP 04/08/2021 5:13 PM

## 2021-04-08 NOTE — Addendum Note (Signed)
Addended by: Cyril Mourning A on: 04/08/2021 05:13 PM   Modules accepted: Orders

## 2021-04-29 ENCOUNTER — Other Ambulatory Visit: Payer: Self-pay | Admitting: Adult Health

## 2021-04-29 ENCOUNTER — Ambulatory Visit (INDEPENDENT_AMBULATORY_CARE_PROVIDER_SITE_OTHER): Payer: BC Managed Care – PPO

## 2021-04-29 ENCOUNTER — Other Ambulatory Visit: Payer: BC Managed Care – PPO

## 2021-04-29 ENCOUNTER — Encounter: Payer: Self-pay | Admitting: Obstetrics & Gynecology

## 2021-04-29 ENCOUNTER — Other Ambulatory Visit: Payer: Self-pay

## 2021-04-29 DIAGNOSIS — Z3A01 Less than 8 weeks gestation of pregnancy: Secondary | ICD-10-CM | POA: Diagnosis not present

## 2021-04-29 DIAGNOSIS — O3680X0 Pregnancy with inconclusive fetal viability, not applicable or unspecified: Secondary | ICD-10-CM

## 2021-04-29 NOTE — Progress Notes (Signed)
[place] 7+4 wks,single IUP with YS,CRL 13.69 mm,normal ovaries,FHR 164 bpm

## 2021-05-27 ENCOUNTER — Encounter: Payer: Self-pay | Admitting: Women's Health

## 2021-05-27 ENCOUNTER — Telehealth: Payer: Self-pay | Admitting: *Deleted

## 2021-05-27 NOTE — Telephone Encounter (Signed)
Spoke with patient. States that she saw some blood when she went to the bathroom when wiping. She denies any pain or cramping. Only saw blood once when wiping after urinating. No recent intercourse or straining. I recommended that she keep an eye on the bleeding and if it becomes heavier or she develops any pain to let us know or go to ER if after hours. Patient has new ob visit on Wednesday with ultrasound. No other questions or concerns at this time.

## 2021-05-28 ENCOUNTER — Other Ambulatory Visit: Payer: Self-pay | Admitting: Obstetrics & Gynecology

## 2021-05-28 DIAGNOSIS — Z3682 Encounter for antenatal screening for nuchal translucency: Secondary | ICD-10-CM

## 2021-05-29 ENCOUNTER — Other Ambulatory Visit: Payer: Self-pay

## 2021-05-29 ENCOUNTER — Ambulatory Visit (INDEPENDENT_AMBULATORY_CARE_PROVIDER_SITE_OTHER): Payer: BC Managed Care – PPO

## 2021-05-29 ENCOUNTER — Encounter: Payer: Self-pay | Admitting: Advanced Practice Midwife

## 2021-05-29 ENCOUNTER — Ambulatory Visit (INDEPENDENT_AMBULATORY_CARE_PROVIDER_SITE_OTHER): Payer: BC Managed Care – PPO | Admitting: Advanced Practice Midwife

## 2021-05-29 ENCOUNTER — Ambulatory Visit: Payer: BC Managed Care – PPO | Admitting: *Deleted

## 2021-05-29 ENCOUNTER — Encounter: Payer: Self-pay | Admitting: Obstetrics & Gynecology

## 2021-05-29 VITALS — BP 104/59 | HR 79 | Wt 133.0 lb

## 2021-05-29 DIAGNOSIS — O0993 Supervision of high risk pregnancy, unspecified, third trimester: Secondary | ICD-10-CM | POA: Insufficient documentation

## 2021-05-29 DIAGNOSIS — Z3A11 11 weeks gestation of pregnancy: Secondary | ICD-10-CM

## 2021-05-29 DIAGNOSIS — Z3682 Encounter for antenatal screening for nuchal translucency: Secondary | ICD-10-CM

## 2021-05-29 DIAGNOSIS — Z3480 Encounter for supervision of other normal pregnancy, unspecified trimester: Secondary | ICD-10-CM

## 2021-05-29 DIAGNOSIS — Z3481 Encounter for supervision of other normal pregnancy, first trimester: Secondary | ICD-10-CM | POA: Diagnosis not present

## 2021-05-29 DIAGNOSIS — Z3009 Encounter for other general counseling and advice on contraception: Secondary | ICD-10-CM

## 2021-05-29 DIAGNOSIS — D563 Thalassemia minor: Secondary | ICD-10-CM | POA: Insufficient documentation

## 2021-05-29 DIAGNOSIS — Z3490 Encounter for supervision of normal pregnancy, unspecified, unspecified trimester: Secondary | ICD-10-CM | POA: Insufficient documentation

## 2021-05-29 DIAGNOSIS — O09899 Supervision of other high risk pregnancies, unspecified trimester: Secondary | ICD-10-CM | POA: Insufficient documentation

## 2021-05-29 DIAGNOSIS — F32A Depression, unspecified: Secondary | ICD-10-CM

## 2021-05-29 DIAGNOSIS — O99341 Other mental disorders complicating pregnancy, first trimester: Secondary | ICD-10-CM

## 2021-05-29 LAB — POCT URINALYSIS DIPSTICK OB
Blood, UA: NEGATIVE
Glucose, UA: NEGATIVE
Ketones, UA: NEGATIVE
Leukocytes, UA: NEGATIVE
Nitrite, UA: NEGATIVE
POC,PROTEIN,UA: NEGATIVE

## 2021-05-29 MED ORDER — SERTRALINE HCL 25 MG PO TABS: 25.0000 mg | ORAL_TABLET | Freq: Every day | ORAL | 2 refills | Status: DC

## 2021-05-29 NOTE — Progress Notes (Signed)
History:   Caitlyn Green is a 20 y.o. G2P1001 at [redacted]w[redacted]d by LMP c/w 7w 4d ultrasound being seen today for her first obstetrical visit.  Her obstetrical history is significant for  short interval pregnancy . Patient does intend to breast feed. Pregnancy history fully reviewed.  Patient reports  endorses depression . She is tearful throughout visit and states she sometimes feels overwhelmed. She feels comfortable confiding in her papa. She lives with her SO/FOB "[name]". She denies thoughts of self harm.    Patient is interested in tubal ligation. She states she knows people will try to encourage her to pursue a non-permanent birth control.   HISTORY: OB History  Gravida Para Term Preterm AB Living  2 1 1  0 0 1  SAB IAB Ectopic Multiple Live Births  0 0 0 0 1    # Outcome Date GA Lbr Len/2nd Weight Sex Delivery Anes PTL Lv  2 Current           1 Term 10/25/20 [redacted]w[redacted]d  5 lb 9 oz (2.523 kg) M Vag-Spont EPI N LIV     Complications: IUGR (intrauterine growth restriction) affecting care of mother    No pap hx. Age 49  Past Medical History:  Diagnosis Date   Back strain    Rheumatoid arthritis (HCC)    Past Surgical History:  Procedure Laterality Date   NO PAST SURGERIES     Family History  Problem Relation Age of Onset   Healthy Mother    Cancer Father    Arthritis Maternal Grandmother    Healthy Sister    Healthy Sister    Healthy Brother    Social History   Tobacco Use   Smoking status: Never   Smokeless tobacco: Never  Vaping Use   Vaping Use: Former  Substance Use Topics   Alcohol use: No   Drug use: No   No Known Allergies Current Outpatient Medications on File Prior to Visit  Medication Sig Dispense Refill   prenatal vitamin w/FE, FA (PRENATAL 1 + 1) 27-1 MG TABS tablet Take 1 tablet by mouth daily at 12 noon. 30 tablet 12   promethazine (PHENERGAN) 25 MG tablet Take 1 tablet (25 mg total) by mouth every 6 (six) hours as needed for nausea or vomiting. 30 tablet  1   No current facility-administered medications on file prior to visit.    Review of Systems Pertinent items noted in HPI and remainder of comprehensive ROS otherwise negative.  Physical Exam:   Vitals:   05/29/21 1109  BP: (!) 104/59  Pulse: 79  Weight: 133 lb (60.3 kg)    General: well-developed, well-nourished female in no acute distress  Breasts:  Deferred  Skin: normal coloration and turgor, no rashes  Neurologic: oriented, normal, negative, normal mood  Extremities: normal strength, tone, and muscle mass, ROM of all joints is normal  HEENT PERRLA, extraocular movement intact and sclera clear, anicteric  Neck supple and no masses  Cardiovascular: regular rate and rhythm  Respiratory:  no respiratory distress, normal breath sounds  Abdomen: soft, non-tender; bowel sounds normal; no masses,  no organomegaly  Pelvic: Not indicated    Assessment:    Pregnancy: G2P1001 Patient Active Problem List   Diagnosis Date Noted   Encounter for supervision of normal pregnancy, antepartum 05/29/2021   Short interval between pregnancies affecting pregnancy, antepartum 05/29/2021   Alpha thalassemia silent carrier 05/29/2021   JIA (juvenile idiopathic arthritis) (HCC) 11/16/2018   Asthma 04/05/2012  Plan:    1. Supervision of other normal pregnancy, antepartum - LOB, happy to have her back ds - Integrated 1 - Urine Culture - Pain Management Screening Profile (10S) - Genetic Screening - [facility] AMB BABYSCRIPTS SCHEDULE OPTIMIZATION - CBC/D/Plt+RPR+Rh+ABO+RubIgG... - GC/Chlamydia Probe Amp - POC Urinalysis Dipstick OB  2. Short interval between pregnancies affecting pregnancy, antepartum - SVD 10/25/2020  3. Alpha thalassemia silent carrier   4. [redacted] weeks gestation of pregnancy   5. Depression affecting pregnancy in first trimester, antepartum - Proud of patient for asking for help! - sertraline (ZOLOFT) 25 MG tablet; Take 1 tablet (25 mg total) by mouth daily. Take  half tablet for one week then increase to one tablet daily  Dispense: 30 tablet; Refill: 2 - Amb ref to [facility]  6. Unwanted fertility - For ongoing discussion.   Initial labs drawn. Continue prenatal vitamins. Problem list reviewed and updated. Genetic Screening discussed, First trimester screen, Quad screen, and NIPS: ordered. Ultrasound discussed; fetal anatomic survey: results reviewed. Anticipatory guidance about prenatal visits given including labs, ultrasounds, and testing. Discussed usage of Babyscripts and virtual visits as additional source of managing and completing prenatal visits in midst of coronavirus and pandemic.   Encouraged to complete MyChart Registration for her ability to review results, send requests, and have questions addressed.  The nature of [place] [facility] for [facility] Healthcare/Faculty Practice with multiple MDs and Advanced Practice Providers was explained to patient; also emphasized that residents, students are part of our team. Routine obstetric precautions reviewed. Encouraged to seek out care at office or emergency room [facility] preferred) for urgent and/or emergent concerns. Return in about 4 weeks (around 06/26/2021).     Clayton Bibles, MSA, MSN, CNM Certified Nurse Midwife, Biochemist, clinical for [employer], [facility]

## 2021-05-29 NOTE — Patient Instructions (Addendum)

## 2021-05-29 NOTE — Progress Notes (Signed)
[place] 11+6 wks,measurements c/w dates,CRL 58.17 mm,fhr 164 bpm,normal ovaries,anterior placenta NB present,NT 1 mm

## 2021-05-31 ENCOUNTER — Encounter: Payer: Self-pay | Admitting: Advanced Practice Midwife

## 2021-05-31 LAB — PMP SCREEN PROFILE (10S), URINE
Amphetamine Scrn, Ur: NEGATIVE ng/mL
BARBITURATE SCREEN URINE: NEGATIVE ng/mL
BENZODIAZEPINE SCREEN, URINE: NEGATIVE ng/mL
CANNABINOIDS UR QL SCN: NEGATIVE ng/mL
Cocaine (Metab) Scrn, Ur: NEGATIVE ng/mL
Creatinine(Crt), U: 183.4 mg/dL (ref 20.0–300.0)
Methadone Screen, Urine: NEGATIVE ng/mL
OXYCODONE+OXYMORPHONE UR QL SCN: NEGATIVE ng/mL
Opiate Scrn, Ur: NEGATIVE ng/mL
Ph of Urine: 7.6 (ref 4.5–8.9)
Phencyclidine Qn, Ur: NEGATIVE ng/mL
Propoxyphene Scrn, Ur: NEGATIVE ng/mL

## 2021-05-31 LAB — GC/CHLAMYDIA PROBE AMP
Chlamydia trachomatis, NAA: POSITIVE — AB
Neisseria Gonorrhoeae by PCR: NEGATIVE

## 2021-06-01 ENCOUNTER — Other Ambulatory Visit: Payer: Self-pay | Admitting: Advanced Practice Midwife

## 2021-06-01 DIAGNOSIS — A749 Chlamydial infection, unspecified: Secondary | ICD-10-CM | POA: Insufficient documentation

## 2021-06-01 DIAGNOSIS — O98811 Other maternal infectious and parasitic diseases complicating pregnancy, first trimester: Secondary | ICD-10-CM

## 2021-06-01 LAB — URINE CULTURE

## 2021-06-01 MED ORDER — AZITHROMYCIN 250 MG PO TABS: 1000.0000 mg | ORAL_TABLET | Freq: Once | ORAL | 0 refills | Status: AC

## 2021-06-01 NOTE — Progress Notes (Signed)
+   Chlamydia. See MyChart encounter  Clayton Bibles, MSA, MSN, CNM Certified Nurse Midwife, [facility] for [employer], [facility]

## 2021-06-04 LAB — STATUS REPORT

## 2021-06-06 LAB — CBC/D/PLT+RPR+RH+ABO+RUBIGG...
Antibody Screen: NEGATIVE
Basophils Absolute: 0 10*3/uL (ref 0.0–0.2)
Basos: 1 %
EOS (ABSOLUTE): 0 10*3/uL (ref 0.0–0.4)
Eos: 1 %
HCV Ab: <0.1 s/co ratio (ref 0.0–0.9)
HIV Screen 4th Generation wRfx: NONREACTIVE
Hematocrit: 36 % (ref 34.0–46.6)
Hemoglobin: 11.6 g/dL (ref 11.1–15.9)
Hepatitis B Surface Ag: NEGATIVE
Immature Grans (Abs): 0 10*3/uL (ref 0.0–0.1)
Immature Granulocytes: 0 %
Lymphocytes Absolute: 1.6 10*3/uL (ref 0.7–3.1)
Lymphs: 32 %
MCH: 27.6 pg (ref 26.6–33.0)
MCHC: 32.2 g/dL (ref 31.5–35.7)
MCV: 86 fL (ref 79–97)
Monocytes Absolute: 0.3 10*3/uL (ref 0.1–0.9)
Monocytes: 6 %
Neutrophils Absolute: 3.1 10*3/uL (ref 1.4–7.0)
Neutrophils: 60 %
Platelets: 304 10*3/uL (ref 150–450)
RBC: 4.2 x10E6/uL (ref 3.77–5.28)
RDW: 13.8 % (ref 11.7–15.4)
RPR Ser Ql: NONREACTIVE
Rh Factor: POSITIVE
Rubella Antibodies, IGG: 3.23 index (ref >0.99)
WBC: 5.1 10*3/uL (ref 3.4–10.8)

## 2021-06-06 LAB — INTEGRATED 1
Crown Rump Length: 58.2 mm
Gest. Age on Collection Date: 12.1 weeks
Maternal Age at EDD: 21.1 yr
Nuchal Translucency (NT): 1 mm
Number of Fetuses: 1
PAPP-A Value: 1177 ng/mL
Weight: 133 [lb_av]

## 2021-06-06 LAB — HCV INTERPRETATION

## 2021-06-17 ENCOUNTER — Encounter: Payer: Self-pay | Admitting: Women's Health

## 2021-06-25 ENCOUNTER — Other Ambulatory Visit: Payer: Self-pay

## 2021-06-25 ENCOUNTER — Ambulatory Visit (INDEPENDENT_AMBULATORY_CARE_PROVIDER_SITE_OTHER): Payer: BC Managed Care – PPO | Admitting: Women's Health

## 2021-06-25 ENCOUNTER — Encounter: Payer: Self-pay | Admitting: Women's Health

## 2021-06-25 VITALS — BP 103/58 | HR 82 | Wt 128.4 lb

## 2021-06-25 DIAGNOSIS — Z363 Encounter for antenatal screening for malformations: Secondary | ICD-10-CM

## 2021-06-25 DIAGNOSIS — A749 Chlamydial infection, unspecified: Secondary | ICD-10-CM

## 2021-06-25 DIAGNOSIS — Z1379 Encounter for other screening for genetic and chromosomal anomalies: Secondary | ICD-10-CM

## 2021-06-25 DIAGNOSIS — Z3482 Encounter for supervision of other normal pregnancy, second trimester: Secondary | ICD-10-CM

## 2021-06-25 DIAGNOSIS — Z3480 Encounter for supervision of other normal pregnancy, unspecified trimester: Secondary | ICD-10-CM

## 2021-06-25 NOTE — Progress Notes (Signed)
LOW-RISK PREGNANCY VISIT Patient name: Caitlyn Green MRN [id number]  Date of birth: 2000-09-06 Chief Complaint:   Routine Prenatal Visit  History of Present Illness:   Caitlyn Green is a 20 y.o. G79P1001 female at [redacted]w[redacted]d with an Estimated Date of Delivery: 12/12/21 being seen today for ongoing management of a low-risk pregnancy.   Today she reports no complaints. Contractions: Irritability. Vag. Bleeding: None.   . denies leaking of fluid. +CT, she & partner tooks meds, hasn't had sex since.   Depression screen [facility] 2/9 05/29/2021 04/30/2020 03/13/2020  Decreased Interest 3 1 0  Down, Depressed, Hopeless 2 1 1   PHQ - 2 Score 5 2 1   Altered sleeping 1 3 1   Tired, decreased energy 2 1 1   Change in appetite 2 1 0  Feeling bad or failure about yourself  2 0 1  Trouble concentrating 1 0 0  Moving slowly or fidgety/restless 0 0 0  Suicidal thoughts 0 0 0  PHQ-9 Score 13 7 4   Difficult doing work/chores - - Not difficult at all     GAD 7 : Generalized Anxiety Score 05/29/2021 04/30/2020 03/13/2020  Nervous, Anxious, on Edge 2 0 1  Control/stop worrying 2 0 0  Worry too much - different things 2 0 1  Trouble relaxing 2 0 0  Restless 1 0 0  Easily annoyed or irritable 3 2 1   Afraid - awful might happen 1 0 0  Total GAD 7 Score 13 2 3   Anxiety Difficulty - - Not difficult at all      Review of Systems:   Pertinent items are noted in HPI Denies abnormal vaginal discharge w/ itching/odor/irritation, headaches, visual changes, shortness of breath, chest pain, abdominal pain, severe nausea/vomiting, or problems with urination or bowel movements unless otherwise stated above. Pertinent History Reviewed:  Reviewed past medical,surgical, social, obstetrical and family history.  Reviewed problem list, medications and allergies. Physical Assessment:   Vitals:   06/25/21 1342  BP: (!) 103/58  Pulse: 82  Weight: 128 lb 6.4 oz (58.2 kg)  Body mass index is 17.91 kg/m.        Physical  Examination:   General appearance: Well appearing, and in no distress  Mental status: Alert, oriented to person, place, and time  Skin: Warm & dry  Cardiovascular: Normal heart rate noted  Respiratory: Normal respiratory effort, no distress  Abdomen: Soft, gravid, nontender  Pelvic: Cervical exam deferred         Extremities: Edema: Trace  Fetal Status: Fetal Heart Rate (bpm): 143        Chaperone: N/A   No results found for this or any previous visit (from the past 24 hour(s)).  Assessment & Plan:  1) Low-risk pregnancy G2P1001 at [redacted]w[redacted]d with an Estimated Date of Delivery: 12/12/21   2) H/O FGR, EFW 28, 32, 36wk  3) Recent +CT> POC today   Meds: No orders of the defined types were placed in this encounter.  Labs/procedures today: 2nd IT  Plan:  Continue routine obstetrical care  Next visit: prefers will be in person for anatomy u/s     Reviewed: Preterm labor symptoms and general obstetric precautions including but not limited to vaginal bleeding, contractions, leaking of fluid and fetal movement were reviewed in detail with the patient.  All questions were answered. Does have home bp cuff. Office bp cuff given: not applicable. Check bp weekly, let 05/02/2020 know if consistently >140 and/or >90.  Follow-up: Return in about 4 weeks (around  07/23/2021) for LROB, HT:MBPJPET, CNM, in person.  Future Appointments  Date Time Provider Department Center  07/23/2021  2:15 PM [facility] [place] [facility] None    Orders Placed This Encounter  Procedures   GC/Chlamydia Probe Amp   US OB Comp + 14 Wk   INTEGRATED 2   Cheral Marker CNM, [facility] 06/25/2021 2:03 PM

## 2021-06-25 NOTE — Patient Instructions (Signed)
[place], thank you for choosing our office today! We appreciate the opportunity to meet your healthcare needs. You may receive a short survey by mail, e-mail, or through [employer]. If you are happy with your care we would appreciate if you could take just a few minutes to complete the survey questions. We read all of your comments and take your feedback very seriously. Thank you again for choosing our office.  Center for [employer] Team at [facility] at [facility] ([address] [place], [place] [address]) Entrance C, located off of [address] Free 24/7 valet parking  Go to [email or web address] to register for FREE online childbirth classes  Call the office [phone]) or go to [facility] if: You begin to severe cramping Your water breaks.  Sometimes it is a big gush of fluid, sometimes it is just a trickle that keeps getting your panties wet or running down your legs You have vaginal bleeding.  It is normal to have a small amount of spotting if your cervix was checked.   [facility] Pediatricians/Family Doctors [facility]): [address] Colette Ribas, [phone]           [facility]: [address], [phone]                [facility]): [address], [phone] (call to ask if accepting patients) [facility] Department: [address], [place], [phone]    [facility] Pediatricians/Family Doctors [facility]): [phone] [address], [phone] [facility]: [address] [place], [phone] [facility]: [address], [phone]  [facility] Doctors  [facility]): [phone] [facility]: [address] [phone]   [facility] Doctors [facility]: [address] [phone]  [facility] Doctors  [facility]: [phone], [phone]  Home Blood Pressure Monitoring for Patients   Your provider has recommended that you check your blood pressure (BP) at least once a week at home. If you do not have a blood pressure cuff at home, one will be provided for you. Contact your provider if you have not received your monitor within 1 week.   Helpful Tips for Accurate Home Blood Pressure Checks  Don't smoke, exercise, or drink caffeine 30 minutes before checking your BP Use the restroom before checking your BP (a full bladder can raise your pressure) Relax in a comfortable upright chair Feet on the ground Left arm resting comfortably on a flat surface at the level of your heart Legs uncrossed Back supported Sit quietly and don't talk Place the cuff on your bare arm Adjust snuggly, so that only two fingertips can fit between your skin and the top of the cuff Check 2 readings separated by at least one minute Keep a log of your BP readings For a visual, please reference this diagram: http://ccnc.care/bpdiagram  Provider Name: [facility]     Phone: [phone]  Zone 1: ALL CLEAR  Continue to monitor your symptoms:  BP reading is less than 140 (top number) or less than 90 (bottom number)  No right upper stomach pain No headaches or seeing spots No feeling nauseated or throwing up No swelling in face and hands  Zone 2: CAUTION Call your doctor's office for any of the following:  BP reading is greater than 140 (top number) or greater than  90 (bottom number)  Stomach pain under your ribs in the middle or right side Headaches or seeing spots Feeling nauseated or throwing up Swelling in face and hands  Zone 3: EMERGENCY  Seek immediate medical care if you have any of the following:  BP reading is greater than160 (top number) or greater than 110 (bottom number) Severe headaches not improving with Tylenol Serious difficulty catching your breath Any worsening symptoms from  Zone 2     Second Trimester of Pregnancy The second trimester is from week 14 through week 27 (months 4 through 6). The second trimester is often a time when you feel your best. Your body has adjusted to being pregnant, and you begin to feel better physically. Usually, morning sickness has lessened or quit completely, you may have more energy, and you may have an increase in appetite. The second trimester is also a time when the fetus is growing rapidly. At the end of the sixth month, the fetus is about 9 inches long and weighs about 1 pounds. You will likely begin to feel the baby move (quickening) between 16 and 20 weeks of pregnancy. Body changes during your second trimester Your body continues to go through many changes during your second trimester. The changes vary from woman to woman. Your weight will continue to increase. You will notice your lower abdomen bulging out. You may begin to get stretch marks on your hips, abdomen, and breasts. You may develop headaches that can be relieved by medicines. The medicines should be approved by your health care provider. You may urinate more often because the fetus is pressing on your bladder. You may develop or continue to have heartburn as a result of your pregnancy. You may develop constipation because certain hormones are causing the muscles that push waste through your intestines to slow down. You may develop hemorrhoids or swollen, bulging veins (varicose veins). You may have back pain. This is caused by: Weight gain. Pregnancy hormones that are relaxing the joints in your pelvis. A shift in weight and the muscles that support your balance. Your breasts will continue to grow and they will continue to become tender. Your gums may bleed and may be sensitive to brushing and flossing. Dark spots or blotches (chloasma, mask of pregnancy) may develop on your face. This will likely fade after the baby is born. A dark line from your belly button to  the pubic area (linea nigra) may appear. This will likely fade after the baby is born. You may have changes in your hair. These can include thickening of your hair, rapid growth, and changes in texture. Some women also have hair loss during or after pregnancy, or hair that feels dry or thin. Your hair will most likely return to normal after your baby is born.  What to expect at prenatal visits During a routine prenatal visit: You will be weighed to make sure you and the fetus are growing normally. Your blood pressure will be taken. Your abdomen will be measured to track your baby's growth. The fetal heartbeat will be listened to. Any test results from the previous visit will be discussed.  Your health care provider may ask you: How you are feeling. If you are feeling the baby move. If you have had any abnormal symptoms, such as leaking fluid, bleeding, severe headaches, or abdominal cramping. If you are using any tobacco products, including cigarettes, chewing tobacco, and electronic cigarettes. If you have any questions.  Other tests that may be performed during   your second trimester include: Blood tests that check for: Low iron levels (anemia). High blood sugar that affects pregnant women (gestational diabetes) between 24 and 28 weeks. Rh antibodies. This is to check for a protein on red blood cells (Rh factor). Urine tests to check for infections, diabetes, or protein in the urine. An ultrasound to confirm the proper growth and development of the baby. An amniocentesis to check for possible genetic problems. Fetal screens for spina bifida and Down syndrome. HIV (human immunodeficiency virus) testing. Routine prenatal testing includes screening for HIV, unless you choose not to have this test.  Follow these instructions at home: Medicines Follow your health care provider's instructions regarding medicine use. Specific medicines may be either safe or unsafe to take during  pregnancy. Take a prenatal vitamin that contains at least 600 micrograms (mcg) of folic acid. If you develop constipation, try taking a stool softener if your health care provider approves. Eating and drinking Eat a balanced diet that includes fresh fruits and vegetables, whole grains, good sources of protein such as meat, eggs, or tofu, and low-fat dairy. Your health care provider will help you determine the amount of weight gain that is right for you. Avoid raw meat and uncooked cheese. These carry germs that can cause birth defects in the baby. If you have low calcium intake from food, talk to your health care provider about whether you should take a daily calcium supplement. Limit foods that are high in fat and processed sugars, such as fried and sweet foods. To prevent constipation: Drink enough fluid to keep your urine clear or pale yellow. Eat foods that are high in fiber, such as fresh fruits and vegetables, whole grains, and beans. Activity Exercise only as directed by your health care provider. Most women can continue their usual exercise routine during pregnancy. Try to exercise for 30 minutes at least 5 days a week. Stop exercising if you experience uterine contractions. Avoid heavy lifting, wear low heel shoes, and practice good posture. A sexual relationship may be continued unless your health care provider directs you otherwise. Relieving pain and discomfort Wear a good support bra to prevent discomfort from breast tenderness. Take warm sitz baths to soothe any pain or discomfort caused by hemorrhoids. Use hemorrhoid cream if your health care provider approves. Rest with your legs elevated if you have leg cramps or low back pain. If you develop varicose veins, wear support hose. Elevate your feet for 15 minutes, 3-4 times a day. Limit salt in your diet. Prenatal Care Write down your questions. Take them to your prenatal visits. Keep all your prenatal visits as told by your health  care provider. This is important. Safety Wear your seat belt at all times when driving. Make a list of emergency phone numbers, including numbers for family, friends, the hospital, and police and fire departments. General instructions Ask your health care provider for a referral to a local prenatal education class. Begin classes no later than the beginning of month 6 of your pregnancy. Ask for help if you have counseling or nutritional needs during pregnancy. Your health care provider can offer advice or refer you to specialists for help with various needs. Do not use hot tubs, steam rooms, or saunas. Do not douche or use tampons or scented sanitary pads. Do not cross your legs for long periods of time. Avoid cat litter boxes and soil used by cats. These carry germs that can cause birth defects in the baby and possibly loss of the   fetus by miscarriage or stillbirth. Avoid all smoking, herbs, alcohol, and unprescribed drugs. Chemicals in these products can affect the formation and growth of the baby. Do not use any products that contain nicotine or tobacco, such as cigarettes and e-cigarettes. If you need help quitting, ask your health care provider. Visit your dentist if you have not gone yet during your pregnancy. Use a soft toothbrush to brush your teeth and be gentle when you floss. Contact a health care provider if: You have dizziness. You have mild pelvic cramps, pelvic pressure, or nagging pain in the abdominal area. You have persistent nausea, vomiting, or diarrhea. You have a bad smelling vaginal discharge. You have pain when you urinate. Get help right away if: You have a fever. You are leaking fluid from your vagina. You have spotting or bleeding from your vagina. You have severe abdominal cramping or pain. You have rapid weight gain or weight loss. You have shortness of breath with chest pain. You notice sudden or extreme swelling of your face, hands, ankles, feet, or legs. You  have not felt your baby move in over an hour. You have severe headaches that do not go away when you take medicine. You have vision changes. Summary The second trimester is from week 14 through week 27 (months 4 through 6). It is also a time when the fetus is growing rapidly. Your body goes through many changes during pregnancy. The changes vary from woman to woman. Avoid all smoking, herbs, alcohol, and unprescribed drugs. These chemicals affect the formation and growth your baby. Do not use any tobacco products, such as cigarettes, chewing tobacco, and e-cigarettes. If you need help quitting, ask your health care provider. Contact your health care provider if you have any questions. Keep all prenatal visits as told by your health care provider. This is important. This information is not intended to replace advice given to you by your health care provider. Make sure you discuss any questions you have with your health care provider. Document Released: 06/17/2001 Document Revised: 11/29/2015 Document Reviewed: 08/24/2012 Elsevier Interactive Patient Education  2017 [employer]  

## 2021-06-26 ENCOUNTER — Encounter: Payer: BC Managed Care – PPO | Admitting: Advanced Practice Midwife

## 2021-06-27 LAB — INTEGRATED 2
AFP MoM: 1.15
Alpha-Fetoprotein: 46.7 ng/mL
Crown Rump Length: 58.2 mm
DIA MoM: 1.36
DIA Value: 242.7 pg/mL
Estriol, Unconjugated: 1.13 ng/mL
Gest. Age on Collection Date: 12.1 weeks
Gestational Age: 16 weeks
Maternal Age at EDD: 21.1 yr
Nuchal Translucency (NT): 1 mm
Nuchal Translucency MoM: 0.77
Number of Fetuses: 1
PAPP-A MoM: 1.22
PAPP-A Value: 1177 ng/mL
Test Results:: NEGATIVE
Weight: 125 [lb_av]
Weight: 133 [lb_av]
hCG MoM: 1.11
hCG Value: 47.9 IU/mL
uE3 MoM: 1.14

## 2021-06-27 LAB — GC/CHLAMYDIA PROBE AMP
Chlamydia trachomatis, NAA: NEGATIVE
Neisseria Gonorrhoeae by PCR: NEGATIVE

## 2021-07-07 ENCOUNTER — Other Ambulatory Visit: Payer: Self-pay

## 2021-07-07 ENCOUNTER — Encounter (HOSPITAL_COMMUNITY): Payer: Self-pay | Admitting: *Deleted

## 2021-07-07 ENCOUNTER — Emergency Department (HOSPITAL_COMMUNITY)
Admission: EM | Admit: 2021-07-07 | Discharge: 2021-07-07 | Disposition: A | Discharge status: Home / Self Care | Payer: BC Managed Care – PPO | Attending: Emergency Medicine | Admitting: Emergency Medicine

## 2021-07-07 DIAGNOSIS — R1030 Lower abdominal pain, unspecified: Secondary | ICD-10-CM | POA: Insufficient documentation

## 2021-07-07 DIAGNOSIS — O9A212 Injury, poisoning and certain other consequences of external causes complicating pregnancy, second trimester: Secondary | ICD-10-CM | POA: Diagnosis not present

## 2021-07-07 DIAGNOSIS — S161XXA Strain of muscle, fascia and tendon at neck level, initial encounter: Secondary | ICD-10-CM | POA: Diagnosis not present

## 2021-07-07 DIAGNOSIS — X58XXXA Exposure to other specified factors, initial encounter: Secondary | ICD-10-CM | POA: Diagnosis not present

## 2021-07-07 DIAGNOSIS — Z3A17 17 weeks gestation of pregnancy: Secondary | ICD-10-CM | POA: Diagnosis not present

## 2021-07-07 DIAGNOSIS — O26892 Other specified pregnancy related conditions, second trimester: Secondary | ICD-10-CM | POA: Insufficient documentation

## 2021-07-07 LAB — URINALYSIS, ROUTINE W REFLEX MICROSCOPIC
Bilirubin Urine: NEGATIVE
Glucose, UA: NEGATIVE mg/dL
Hgb urine dipstick: NEGATIVE
Ketones, ur: NEGATIVE mg/dL
Nitrite: NEGATIVE
Protein, ur: NEGATIVE mg/dL
Specific Gravity, Urine: 1.008 (ref 1.005–1.030)
pH: 6 (ref 5.0–8.0)

## 2021-07-07 NOTE — L&D Delivery Note (Signed)
OB/GYN Faculty Practice Delivery Note  Caitlyn Green is a 21 y.o. [id number] s/p SVD at [redacted]w[redacted]d. She was admitted for SOL.   ROM: 1h 69m with clear fluid GBS Status: Positive, adequately treated with PCN Maximum Maternal Temperature: 98.1  Labor Progress: Admitted in active labor and progressed without augmentation to 9.5cm with a bulging bag of water. AROM'd and repositioned then became complete and +2.  Delivery Date/Time: 11/28/21 at 1736 Delivery: Called to room to begin pushing. Pt pushed very well and head delivered ROA. Loose double nuchal cord present. Shoulder and body delivered using somersault maneuver to reduce nuchal loops. Infant with spontaneous cry, placed on mother's abdomen, dried and stimulated. Cord clamped x 2 after 5-minute delay, and cut by FOB. Cord blood drawn and cord sample collected. Placenta delivered spontaneously, intact, with 3-vessel cord. Fundus firm with massage and Pitocin. Labia, perineum, vagina, and cervix inspected, no laceration found.  Placenta: spontaneous, intact sent to L&D for disposal Complications: none Lacerations: none EBL: 300 Analgesia: epidural  Postpartum Planning [x]  transfer orders to MB [x]  discharge summary started & shared [x]  message to sent to schedule follow-up  [x]  lists updated [x]  vaccines UTD  Infant: Girl  APGARs 8/9  2700g  Edd Arbour, CNM, IBCLC Certified Nurse Midwife, [facility] [employer], [facility] 11/28/2021, 7:36 PM

## 2021-07-07 NOTE — ED Provider Notes (Signed)
[facility] EMERGENCY DEPARTMENT Provider Note   CSN: [id number] Arrival date & time: 07/07/21  1422     History  Chief Complaint  Patient presents with   Neck Pain   Abdominal Pain    Caitlyn Green is a 21 y.o. female.   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  This is a pain that comes on almost every day and it certainly gets worse with certain movements of her head and her neck.  It gets worse with movement of her arm, she notes that she is forced to carry around her 39-month-old child with her right arm in the child carrier because she cannot use her left arm.  She denies any joint problems in her legs and has had no fevers chills nausea or vomiting, she has no headache, she has no blurred vision, she has no numbness or weakness.  She also endorses some discomfort in her lower abdomen going on between 2 and 3 weeks in the suprapubic region, seems to get worse when she sits down for a while, there is no associated urinary symptoms vaginal bleeding or discharge.  The patient states this is her second pregnancy, the first 1 was born at term, no significant complications.  As far she knows this 1 is uncomplicated as well, [facility]  Home Medications Prior to Admission medications   Medication Sig Start Date End Date Taking? Authorizing Provider  acetaminophen (TYLENOL) 500 MG tablet Take 500 mg by mouth every 6 (six) hours as needed.   Yes [provider]  prenatal vitamin w/FE, FA (PRENATAL 1 + 1) 27-1 MG TABS tablet Take 1 tablet by mouth daily at 12 noon. 04/08/21  Yes Adline Potter, NP  promethazine (PHENERGAN) 25 MG tablet  Take 1 tablet (25 mg total) by mouth every 6 (six) hours as needed for nausea or vomiting. 04/08/21  Yes Cyril Mourning A, NP  sertraline (ZOLOFT) 25 MG tablet Take 1 tablet (25 mg total) by mouth daily. Take half tablet for one week then increase to one tablet daily 05/29/21 08/27/21 Yes Calvert Cantor, CNM      Allergies    Patient has no known allergies.    Review of Systems   Review of Systems  Gastrointestinal:  Positive for abdominal pain.  Musculoskeletal:  Positive for neck pain.  All other systems reviewed and are negative.  Physical Exam Updated Vital Signs BP 135/77    Pulse 94    Temp 98 F (36.7 C) (Oral)    Resp 16    Ht 1.803 m ( )    Wt 58.1 kg    LMP 03/07/2021 (Approximate)    SpO2 100%    Breastfeeding No    BMI 17.85 kg/m  Physical Exam Vitals and nursing note reviewed.  Constitutional:      General: She is not in acute distress.    Appearance: She is well-developed.  HENT:     Head: Normocephalic and atraumatic.     Mouth/Throat:     Pharynx: No oropharyngeal exudate.  Eyes:     General: No scleral icterus.  Right eye: No discharge.        Left eye: No discharge.     Conjunctiva/sclera: Conjunctivae normal.     Pupils: Pupils are equal, round, and reactive to light.  Neck:     Thyroid: No thyromegaly.     Vascular: No JVD.  Cardiovascular:     Rate and Rhythm: Normal rate and regular rhythm.     Heart sounds: Normal heart sounds. No murmur heard.   No friction rub. No gallop.  Pulmonary:     Effort: Pulmonary effort is normal. No respiratory distress.     Breath sounds: Normal breath sounds. No wheezing or rales.  Abdominal:     General: Bowel sounds are normal. There is no distension.     Palpations: Abdomen is soft. There is no mass.     Tenderness: There is no abdominal tenderness.  Musculoskeletal:        General: No tenderness. Normal range of motion.     Cervical back: Normal range of motion and neck supple.     Right lower  leg: No edema.     Left lower leg: No edema.     Comments: There is decreased range of motion of the left elbow which is chronic, the right shoulder has pain with range of motion as does the trapezius have tenderness with palpation, there is tenderness up and down the paraspinal muscles of the spine on the right side.  Lymphadenopathy:     Cervical: No cervical adenopathy.  Skin:    General: Skin is warm and dry.     Findings: No erythema or rash.  Neurological:     Mental Status: She is alert.     Coordination: Coordination normal.     Comments: Normal gait, normal ability to follow commands shy of performing extension with her left arm secondary to her elbow pain.  She has no difficulty with speech and cranial nerves III through XII are unremarkable  Psychiatric:        Behavior: Behavior normal.    ED Results / Procedures / Treatments   Labs (all labs ordered are listed, but only abnormal results are displayed) Labs Reviewed  URINALYSIS, ROUTINE W REFLEX MICROSCOPIC - Abnormal; Notable for the following components:      Result Value   Color, Urine STRAW (*)    Leukocytes,Ua SMALL (*)    Bacteria, UA RARE (*)    All other components within normal limits  URINE CULTURE    EKG None  Radiology No results found.  Procedures Procedures    Medications Ordered in ED Medications - No data to display  ED Course/ Medical Decision Making/ A&P                           Medical Decision Making  This patient presents to the ED for concern of joint pain and lower abdominal discomfort, this involves an extensive number of treatment options, and is a complaint that carries with it a high risk of complications and morbidity.  The differential diagnosis includes ongoing rheumatoid arthritis, the patient is not currently taking any medications for this now that she is pregnant.  She had been on medications in the past but is currently just taking a prenatal vitamin.  She also has  abdominal discomfort which could be related to bladder issues such as urinary tract infection, cystitis, this could be round ligament pain or other problems related to her pregnancy.   Co  morbidities that complicate the patient evaluation  Chronic rheumatoid arthritis   Additional history obtained:  Additional history obtained from medical record External records from outside source obtained and reviewed including prior pregnancy, prior rheumatoid arthritis treatment   Lab Tests:  I Ordered, and personally interpreted labs.  The pertinent results include: Urinalysis with culture and a bedside ultrasound   Imaging Studies ordered:  I ordered imaging studies including bedside ultrasound I independently visualized and interpreted imaging which showed normal-appearing fetus, heart rate of 148 bpm with good fetal movement, no free fluid in the pelvis I agree with the radiologist interpretation   Cardiac Monitoring:  The patient was maintained on a cardiac monitor.  I personally viewed and interpreted the cardiac monitored which showed an underlying rhythm of: Normal sinus rhythm   Medicines ordered and prescription drug management:   I have reviewed the patients home medicines and have made adjustments as needed   Test Considered:  CT scan but given pregnancy and lack of significant tenderness I do not think this is warranted, especially given the urinalysis being negative for infection   Critical Interventions:  Ultrasound to confirm fetal movements and heart rate    Problem List / ED Course:  Patient will be prescribed topical lidocaine as this is more safe in pregnancy than an anti-inflammatory.  She is agreeable, I suspect that most of her joint pain is related to her underlying rheumatoid arthritis and the overuse of her right arm.  She was counseled on this, stable for discharge, can follow-up with OB/GYN   Reevaluation:  After the interventions noted above, I  reevaluated the patient and found that they have :improved - no RLQ ttp, no fever   Social Determinants of Health:  Second pregnancy in a short timeframe   Dispostion:  After consideration of the diagnostic results and the patients response to treatment, I feel that the patent would benefit from discharge home.          Final Clinical Impression(s) / ED Diagnoses Final diagnoses:  Lower abdominal pain  Neck strain, initial encounter      Eber Hong, MD 07/07/21 1706

## 2021-07-07 NOTE — Discharge Instructions (Signed)
Please use topical lidocaine to help with pain, you can buy this at your local pharmacy and it comes in either a rub on or roll-on or a patch, Tylenol 650 mg every 6 hours is also safe, see your family doctor as well as your OB/GYN this week for follow-up  Emergency department for severe or worsening symptoms

## 2021-07-07 NOTE — ED Triage Notes (Signed)
Pt c/o pain to right collar bone and right neck x 2 weeks. Pt is currently [redacted] weeks pregnant and is also c/o tightness in her lower abdomen. Denies vaginal bleeding and discharge.

## 2021-07-09 LAB — URINE CULTURE: Culture: <10000 — AB

## 2021-07-23 ENCOUNTER — Ambulatory Visit (INDEPENDENT_AMBULATORY_CARE_PROVIDER_SITE_OTHER): Payer: BC Managed Care – PPO

## 2021-07-23 ENCOUNTER — Ambulatory Visit (INDEPENDENT_AMBULATORY_CARE_PROVIDER_SITE_OTHER): Payer: BC Managed Care – PPO | Admitting: Women's Health

## 2021-07-23 ENCOUNTER — Other Ambulatory Visit: Payer: Self-pay

## 2021-07-23 ENCOUNTER — Encounter: Payer: Self-pay | Admitting: Women's Health

## 2021-07-23 VITALS — BP 88/57 | HR 74 | Wt 130.0 lb

## 2021-07-23 DIAGNOSIS — Z113 Encounter for screening for infections with a predominantly sexual mode of transmission: Secondary | ICD-10-CM | POA: Diagnosis not present

## 2021-07-23 DIAGNOSIS — Z3482 Encounter for supervision of other normal pregnancy, second trimester: Secondary | ICD-10-CM | POA: Diagnosis not present

## 2021-07-23 DIAGNOSIS — Z3480 Encounter for supervision of other normal pregnancy, unspecified trimester: Secondary | ICD-10-CM

## 2021-07-23 DIAGNOSIS — Z3A19 19 weeks gestation of pregnancy: Secondary | ICD-10-CM | POA: Diagnosis not present

## 2021-07-23 DIAGNOSIS — Z363 Encounter for antenatal screening for malformations: Secondary | ICD-10-CM | POA: Diagnosis not present

## 2021-07-23 NOTE — Patient Instructions (Signed)
Caitlyn Green, thank you for choosing our office today! We appreciate the opportunity to meet your healthcare needs. You may receive a short survey by mail, e-mail, or through [employer]. If you are happy with your care we would appreciate if you could take just a few minutes to complete the survey questions. We read all of your comments and take your feedback very seriously. Thank you again for choosing our office.  Center for [employer] Team at [facility] at [facility] ([address] [place], [place] [address]) Entrance C, located off of [address] Free 24/7 valet parking  Go to [email or web address] to register for FREE online childbirth classes  Call the office [phone]) or go to [facility] if: You begin to severe cramping Your water breaks.  Sometimes it is a big gush of fluid, sometimes it is just a trickle that keeps getting your panties wet or running down your legs You have vaginal bleeding.  It is normal to have a small amount of spotting if your cervix was checked.   [facility] Pediatricians/Family Doctors [facility]): [address] Colette Ribas, [phone]           [facility]: [address], [phone]                [facility]): [address], [phone] (call to ask if accepting patients) [facility] Department: [address], [place], [phone]    [facility] Pediatricians/Family Doctors [facility]): [phone] [address], [phone] [facility]: [address] [place], [phone] [facility]: [address], [phone]  [facility] Doctors  [facility]): [phone] [facility]: [address] [phone]   [facility] Doctors [facility]: [address] [phone]  [facility] Doctors  [facility]: [phone], [phone]  Home Blood Pressure Monitoring for Patients   Your provider has recommended that you check your blood pressure (BP) at least once a week at home. If you do not have a blood pressure cuff at home, one will be provided for you. Contact your provider if you have not received your monitor within 1 week.   Helpful Tips for Accurate Home Blood Pressure Checks  Don't smoke, exercise, or drink caffeine 30 minutes before checking your BP Use the restroom before checking your BP (a full bladder can raise your pressure) Relax in a comfortable upright chair Feet on the ground Left arm resting comfortably on a flat surface at the level of your heart Legs uncrossed Back supported Sit quietly and don't talk Place the cuff on your bare arm Adjust snuggly, so that only two fingertips can fit between your skin and the top of the cuff Check 2 readings separated by at least one minute Keep a log of your BP readings For a visual, please reference this diagram: http://ccnc.care/bpdiagram  Provider Name: [facility]     Phone: [phone]  Zone 1: ALL CLEAR  Continue to monitor your symptoms:  BP reading is less than 140 (top number) or less than 90 (bottom number)  No right upper stomach pain No headaches or seeing spots No feeling nauseated or throwing up No swelling in face and hands  Zone 2: CAUTION Call your doctor's office for any of the following:  BP reading is greater than 140 (top number) or greater than  90 (bottom number)  Stomach pain under your ribs in the middle or right side Headaches or seeing spots Feeling nauseated or throwing up Swelling in face and hands  Zone 3: EMERGENCY  Seek immediate medical care if you have any of the following:  BP reading is greater than160 (top number) or greater than 110 (bottom number) Severe headaches not improving with Tylenol Serious difficulty catching your breath Any worsening symptoms from  Zone 2     Second Trimester of Pregnancy The second trimester is from week 14 through week 27 (months 4 through 6). The second trimester is often a time when you feel your best. Your body has adjusted to being pregnant, and you begin to feel better physically. Usually, morning sickness has lessened or quit completely, you may have more energy, and you may have an increase in appetite. The second trimester is also a time when the fetus is growing rapidly. At the end of the sixth month, the fetus is about 9 inches long and weighs about 1 pounds. You will likely begin to feel the baby move (quickening) between 16 and 20 weeks of pregnancy. Body changes during your second trimester Your body continues to go through many changes during your second trimester. The changes vary from woman to woman. Your weight will continue to increase. You will notice your lower abdomen bulging out. You may begin to get stretch marks on your hips, abdomen, and breasts. You may develop headaches that can be relieved by medicines. The medicines should be approved by your health care provider. You may urinate more often because the fetus is pressing on your bladder. You may develop or continue to have heartburn as a result of your pregnancy. You may develop constipation because certain hormones are causing the muscles that push waste through your intestines to slow down. You may develop hemorrhoids or swollen, bulging veins (varicose veins). You may have back pain. This is caused by: Weight gain. Pregnancy hormones that are relaxing the joints in your pelvis. A shift in weight and the muscles that support your balance. Your breasts will continue to grow and they will continue to become tender. Your gums may bleed and may be sensitive to brushing and flossing. Dark spots or blotches (chloasma, mask of pregnancy) may develop on your face. This will likely fade after the baby is born. A dark line from your belly button to  the pubic area (linea nigra) may appear. This will likely fade after the baby is born. You may have changes in your hair. These can include thickening of your hair, rapid growth, and changes in texture. Some women also have hair loss during or after pregnancy, or hair that feels dry or thin. Your hair will most likely return to normal after your baby is born.  What to expect at prenatal visits During a routine prenatal visit: You will be weighed to make sure you and the fetus are growing normally. Your blood pressure will be taken. Your abdomen will be measured to track your baby's growth. The fetal heartbeat will be listened to. Any test results from the previous visit will be discussed.  Your health care provider may ask you: How you are feeling. If you are feeling the baby move. If you have had any abnormal symptoms, such as leaking fluid, bleeding, severe headaches, or abdominal cramping. If you are using any tobacco products, including cigarettes, chewing tobacco, and electronic cigarettes. If you have any questions.  Other tests that may be performed during   your second trimester include: Blood tests that check for: Low iron levels (anemia). High blood sugar that affects pregnant women (gestational diabetes) between 24 and 28 weeks. Rh antibodies. This is to check for a protein on red blood cells (Rh factor). Urine tests to check for infections, diabetes, or protein in the urine. An ultrasound to confirm the proper growth and development of the baby. An amniocentesis to check for possible genetic problems. Fetal screens for spina bifida and Down syndrome. HIV (human immunodeficiency virus) testing. Routine prenatal testing includes screening for HIV, unless you choose not to have this test.  Follow these instructions at home: Medicines Follow your health care provider's instructions regarding medicine use. Specific medicines may be either safe or unsafe to take during  pregnancy. Take a prenatal vitamin that contains at least 600 micrograms (mcg) of folic acid. If you develop constipation, try taking a stool softener if your health care provider approves. Eating and drinking Eat a balanced diet that includes fresh fruits and vegetables, whole grains, good sources of protein such as meat, eggs, or tofu, and low-fat dairy. Your health care provider will help you determine the amount of weight gain that is right for you. Avoid raw meat and uncooked cheese. These carry germs that can cause birth defects in the baby. If you have low calcium intake from food, talk to your health care provider about whether you should take a daily calcium supplement. Limit foods that are high in fat and processed sugars, such as fried and sweet foods. To prevent constipation: Drink enough fluid to keep your urine clear or pale yellow. Eat foods that are high in fiber, such as fresh fruits and vegetables, whole grains, and beans. Activity Exercise only as directed by your health care provider. Most women can continue their usual exercise routine during pregnancy. Try to exercise for 30 minutes at least 5 days a week. Stop exercising if you experience uterine contractions. Avoid heavy lifting, wear low heel shoes, and practice good posture. A sexual relationship may be continued unless your health care provider directs you otherwise. Relieving pain and discomfort Wear a good support bra to prevent discomfort from breast tenderness. Take warm sitz baths to soothe any pain or discomfort caused by hemorrhoids. Use hemorrhoid cream if your health care provider approves. Rest with your legs elevated if you have leg cramps or low back pain. If you develop varicose veins, wear support hose. Elevate your feet for 15 minutes, 3-4 times a day. Limit salt in your diet. Prenatal Care Write down your questions. Take them to your prenatal visits. Keep all your prenatal visits as told by your health  care provider. This is important. Safety Wear your seat belt at all times when driving. Make a list of emergency phone numbers, including numbers for family, friends, the hospital, and police and fire departments. General instructions Ask your health care provider for a referral to a local prenatal education class. Begin classes no later than the beginning of month 6 of your pregnancy. Ask for help if you have counseling or nutritional needs during pregnancy. Your health care provider can offer advice or refer you to specialists for help with various needs. Do not use hot tubs, steam rooms, or saunas. Do not douche or use tampons or scented sanitary pads. Do not cross your legs for long periods of time. Avoid cat litter boxes and soil used by cats. These carry germs that can cause birth defects in the baby and possibly loss of the   fetus by miscarriage or stillbirth. Avoid all smoking, herbs, alcohol, and unprescribed drugs. Chemicals in these products can affect the formation and growth of the baby. Do not use any products that contain nicotine or tobacco, such as cigarettes and e-cigarettes. If you need help quitting, ask your health care provider. Visit your dentist if you have not gone yet during your pregnancy. Use a soft toothbrush to brush your teeth and be gentle when you floss. Contact a health care provider if: You have dizziness. You have mild pelvic cramps, pelvic pressure, or nagging pain in the abdominal area. You have persistent nausea, vomiting, or diarrhea. You have a bad smelling vaginal discharge. You have pain when you urinate. Get help right away if: You have a fever. You are leaking fluid from your vagina. You have spotting or bleeding from your vagina. You have severe abdominal cramping or pain. You have rapid weight gain or weight loss. You have shortness of breath with chest pain. You notice sudden or extreme swelling of your face, hands, ankles, feet, or legs. You  have not felt your baby move in over an hour. You have severe headaches that do not go away when you take medicine. You have vision changes. Summary The second trimester is from week 14 through week 27 (months 4 through 6). It is also a time when the fetus is growing rapidly. Your body goes through many changes during pregnancy. The changes vary from woman to woman. Avoid all smoking, herbs, alcohol, and unprescribed drugs. These chemicals affect the formation and growth your baby. Do not use any tobacco products, such as cigarettes, chewing tobacco, and e-cigarettes. If you need help quitting, ask your health care provider. Contact your health care provider if you have any questions. Keep all prenatal visits as told by your health care provider. This is important. This information is not intended to replace advice given to you by your health care provider. Make sure you discuss any questions you have with your health care provider. Document Released: 06/17/2001 Document Revised: 11/29/2015 Document Reviewed: 08/24/2012 Elsevier Interactive Patient Education  2017 [employer]  

## 2021-07-23 NOTE — Progress Notes (Signed)
[place] 19+5 wks,breech,anterior right placenta gr 0,cx 3.1 cm,svp of fluid 4.5 cm,normal ovaries,fhr 127 BPM,EFW 320 g 55%,anatomy complete,no obvious abnormalities

## 2021-07-23 NOTE — Progress Notes (Signed)
LOW-RISK PREGNANCY VISIT Patient name: Caitlyn Green MRN [id number]  Date of birth: 2000-07-09 Chief Complaint:   Routine Prenatal Visit (Ultrasound)  History of Present Illness:   Anaissa Macfadden is a 21 y.o. G4P1001 female at [redacted]w[redacted]d with an Estimated Date of Delivery: 12/12/21 being seen today for ongoing management of a low-risk pregnancy.   Today she reports no complaints. Wants testing for gc/ct again, no sx.  Contractions: Not present.  .  Movement: Present. denies leaking of fluid.  Depression screen [facility] 2/9 05/29/2021 04/30/2020 03/13/2020  Decreased Interest 3 1 0  Down, Depressed, Hopeless PHQ - 2 Score Altered sleeping Tired, decreased energy Change in appetite 2 1 0  Feeling bad or failure about yourself  2 0 1  Trouble concentrating 1 0 0  Moving slowly or fidgety/restless 0 0 0  Suicidal thoughts 0 0 0  PHQ-9 Score Difficult doing work/chores - - Not difficult at all     GAD 7 : Generalized Anxiety Score 05/29/2021 04/30/2020 03/13/2020  Nervous, Anxious, on Edge 2 0 1  Control/stop worrying 2 0 0  Worry too much - different things 2 0 1  Trouble relaxing 2 0 0  Restless 1 0 0  Easily annoyed or irritable Afraid - awful might happen 1 0 0  Total GAD 7 Score Anxiety Difficulty - - Not difficult at all      Review of Systems:   Pertinent items are noted in HPI Denies abnormal vaginal discharge w/ itching/odor/irritation, headaches, visual changes, shortness of breath, chest pain, abdominal pain, severe nausea/vomiting, or problems with urination or bowel movements unless otherwise stated above. Pertinent History Reviewed:  Reviewed past medical,surgical, social, obstetrical and family history.  Reviewed problem list, medications and allergies. Physical Assessment:   Vitals:   07/23/21 1515  BP: (!) 88/57  Pulse: 74  Weight: 130 lb (59 kg)  Body mass index is 18.13 kg/m.        Physical Examination:    General appearance: Well appearing, and in no distress  Mental status: Alert, oriented to person, place, and time  Skin: Warm & dry  Cardiovascular: Normal heart rate noted  Respiratory: Normal respiratory effort, no distress  Abdomen: Soft, gravid, nontender  Pelvic: Cervical exam deferred         Extremities: Edema: Trace  Fetal Status: Fetal Heart Rate (bpm): 127 u/s   Movement: Present  [place] 19+5 wks,breech,anterior right placenta gr 0,cx 3.1 cm,svp of fluid 4.5 cm,normal ovaries,fhr 127 BPM,EFW 320 g 55%,anatomy complete,no obvious abnormalities   Chaperone: N/A   No results found for this or any previous visit (from the past 24 hour(s)).  Assessment & Plan:  1) Low-risk pregnancy G2P1001 at [redacted]w[redacted]d with an Estimated Date of Delivery: 12/12/21   2) STD screen, gc/ct from urine  3) H/O FGR> EFW today 55%, plan EFW 28, 32, 36wk   Meds: No orders of the defined types were placed in this encounter.  Labs/procedures today: GC/CT and U/S  Plan:  Continue routine obstetrical care  Next visit: prefers in person    Reviewed: Preterm labor symptoms and general obstetric precautions including but not limited to vaginal bleeding, contractions, leaking of fluid and fetal movement were reviewed in detail with the patient.  All questions were answered. Does have home bp cuff. Office bp cuff given: not applicable.  Check bp weekly, let us know if consistently >140 and/or >90.  Follow-up: Return in about 4 weeks (around 08/20/2021) for LROB, CNM, in person.  Future Appointments  Date Time Provider Department Center  08/20/2021  3:30 PM Cheral Marker, CNM [facility]     Orders Placed This Encounter  Procedures   GC/Chlamydia Probe Amp   Cheral Marker CNM, [facility] 07/23/2021 3:41 PM

## 2021-07-25 LAB — GC/CHLAMYDIA PROBE AMP
Chlamydia trachomatis, NAA: POSITIVE — AB
Neisseria Gonorrhoeae by PCR: NEGATIVE

## 2021-07-25 MED ORDER — AZITHROMYCIN 500 MG PO TABS: 1000.0000 mg | ORAL_TABLET | Freq: Once | ORAL | 0 refills | Status: AC

## 2021-07-25 NOTE — Progress Notes (Signed)
Faxed Communicable disease form to Health Dept

## 2021-07-25 NOTE — Addendum Note (Signed)
Addended by: Cheral Marker on: 07/25/2021 04:54 PM   Modules accepted: Orders

## 2021-08-20 ENCOUNTER — Other Ambulatory Visit: Payer: Self-pay

## 2021-08-20 ENCOUNTER — Ambulatory Visit (INDEPENDENT_AMBULATORY_CARE_PROVIDER_SITE_OTHER): Payer: BC Managed Care – PPO | Admitting: Women's Health

## 2021-08-20 ENCOUNTER — Encounter: Payer: Self-pay | Admitting: Women's Health

## 2021-08-20 VITALS — BP 110/70 | HR 108 | Wt 137.0 lb

## 2021-08-20 DIAGNOSIS — Z09 Encounter for follow-up examination after completed treatment for conditions other than malignant neoplasm: Secondary | ICD-10-CM

## 2021-08-20 DIAGNOSIS — Z8619 Personal history of other infectious and parasitic diseases: Secondary | ICD-10-CM

## 2021-08-20 DIAGNOSIS — Z113 Encounter for screening for infections with a predominantly sexual mode of transmission: Secondary | ICD-10-CM

## 2021-08-20 DIAGNOSIS — Z3482 Encounter for supervision of other normal pregnancy, second trimester: Secondary | ICD-10-CM

## 2021-08-20 DIAGNOSIS — Z3480 Encounter for supervision of other normal pregnancy, unspecified trimester: Secondary | ICD-10-CM

## 2021-08-20 NOTE — Patient Instructions (Signed)
Caraline, thank you for choosing our office today! We appreciate the opportunity to meet your healthcare needs. You may receive a short survey by mail, e-mail, or through [employer]. If you are happy with your care we would appreciate if you could take just a few minutes to complete the survey questions. We read all of your comments and take your feedback very seriously. Thank you again for choosing our office.  Center for [employer] Team at [place] at [facility] ([place], [place] [address]) Entrance C, located off of [address] parking   You will have your sugar test next visit.  Please do not eat or drink anything after midnight the night before you come, not even water.  You will be here for at least two hours.  Please make an appointment online for the bloodwork at [email or web address] for 8:00am (or as close to this as possible). Make sure you select the [facility] service center.   CLASSES: Go to [email or web address] to register for classes (childbirth, breastfeeding, waterbirth, infant CPR, daddy bootcamp, etc.)  Call the office [phone]) or go to [facility] if: You begin to have strong, frequent contractions Your water breaks.  Sometimes it is a big gush of fluid, sometimes it is just a trickle that keeps getting your panties wet or running down your legs You have vaginal bleeding.  It is normal to have a small amount of spotting if your cervix was checked.  You don't feel your baby moving like normal.  If you don't, get you something to eat and drink and lay down and focus on feeling your baby move.   If your baby is still not moving like normal, you should call the office or go to [facility].  Call the office [phone]) or go to [facility] hospital for these signs of pre-eclampsia: Severe headache that does not go away with Tylenol Visual changes- seeing spots, double, blurred vision Pain under your right breast or  upper abdomen that does not go away with Tums or heartburn medicine Nausea and/or vomiting Severe swelling in your hands, feet, and face    [facility] Pediatricians/Family Doctors [facility]): [address] [place], [place]: [address] [place], [place] [facility]): [place], [phone] (call to ask if accepting patients) [facility]: [address] [place], [facility]): [address], [place] [facility]: [address] [place], [place] [facility]: [place], [facility]): [phone] [facility]: [address] [place]: [address], [place] [facility]: [phone], [phone]  Home Blood Pressure Monitoring for Patients   Your provider has recommended that you check your blood pressure (BP) at least once a week at home. If you do not have a blood pressure cuff at home, one will be provided for you. Contact your provider if you have not received your monitor within 1 week.   Helpful Tips for Accurate Home Blood Pressure Checks  Don't smoke, exercise, or drink  caffeine 30 minutes before checking your BP °Use the restroom before checking your BP (a full bladder can raise your pressure) °Relax in a comfortable upright chair °Feet on the ground °Left arm resting comfortably on a flat surface at the level of your heart °Legs uncrossed °Back supported °Sit quietly and don't talk °Place the cuff on your bare arm °Adjust snuggly, so that only two fingertips can fit between your skin and the top of the cuff °Check 2  readings separated by at least one minute °Keep a log of your BP readings °For a visual, please reference this diagram: http://ccnc.care/bpdiagram ° °Provider Name: [facility]     Phone: [phone] ° °Zone 1: ALL CLEAR  °Continue to monitor your symptoms:  °BP reading is less than 140 (top number) or less than 90 (bottom number)  °No right upper stomach pain °No headaches or seeing spots °No feeling nauseated or throwing up °No swelling in face and hands ° °Zone 2: CAUTION °Call your doctor's office for any of the following:  °BP reading is greater than 140 (top number) or greater than 90 (bottom number)  °Stomach pain under your ribs in the middle or right side °Headaches or seeing spots °Feeling nauseated or throwing up °Swelling in face and hands ° °Zone 3: EMERGENCY  °Seek immediate medical care if you have any of the following:  °BP reading is greater than160 (top number) or greater than 110 (bottom number) °Severe headaches not improving with Tylenol °Serious difficulty catching your breath °Any worsening symptoms from Zone 2  ° °Second Trimester of Pregnancy °The second trimester is from week 13 through week 28, months 4 through 6. The second trimester is often a time when you feel your best. Your body has also adjusted to being pregnant, and you begin to feel better physically. Usually, morning sickness has lessened or quit completely, you may have more energy, and you may have an increase in appetite. The second trimester is also a time when the fetus is growing rapidly. At the end of the sixth month, the fetus is about 9 inches long and weighs about 1½ pounds. You will likely begin to feel the baby move (quickening) between 18 and 20 weeks of the pregnancy. °BODY CHANGES °Your body goes through many changes during pregnancy. The changes vary from woman to woman.  °Your weight will continue to increase. You will notice your lower abdomen bulging out. °You may begin to get stretch marks on your  hips, abdomen, and breasts. °You may develop headaches that can be relieved by medicines approved by your health care provider. °You may urinate more often because the fetus is pressing on your bladder. °You may develop or continue to have heartburn as a result of your pregnancy. °You may develop constipation because certain hormones are causing the muscles that push waste through your intestines to slow down. °You may develop hemorrhoids or swollen, bulging veins (varicose veins). °You may have back pain because of the weight gain and pregnancy hormones relaxing your joints between the bones in your pelvis and as a result of a shift in weight and the muscles that support your balance. °Your breasts will continue to grow and be tender. °Your gums may bleed and may be sensitive to brushing and flossing. °Dark spots or blotches (chloasma, mask of pregnancy) may develop on your face. This will likely fade after the baby is born. °A dark line from your belly button to the pubic area (linea nigra) may appear. This   will likely fade after the baby is born. You may have changes in your hair. These can include thickening of your hair, rapid growth, and changes in texture. Some women also have hair loss during or after pregnancy, or hair that feels dry or thin. Your hair will most likely return to normal after your baby is born. WHAT TO EXPECT AT YOUR PRENATAL VISITS During a routine prenatal visit: You will be weighed to make sure you and the fetus are growing normally. Your blood pressure will be taken. Your abdomen will be measured to track your baby's growth. The fetal heartbeat will be listened to. Any test results from the previous visit will be discussed. Your health care provider may ask you: How you are feeling. If you are feeling the baby move. If you have had any abnormal symptoms, such as leaking fluid, bleeding, severe headaches, or abdominal cramping. If you have any questions. Other tests that may  be performed during your second trimester include: Blood tests that check for: Low iron levels (anemia). Gestational diabetes (between 24 and 28 weeks). Rh antibodies. Urine tests to check for infections, diabetes, or protein in the urine. An ultrasound to confirm the proper growth and development of the baby. An amniocentesis to check for possible genetic problems. Fetal screens for spina bifida and Down syndrome. HOME CARE INSTRUCTIONS  Avoid all smoking, herbs, alcohol, and unprescribed drugs. These chemicals affect the formation and growth of the baby. Follow your health care provider's instructions regarding medicine use. There are medicines that are either safe or unsafe to take during pregnancy. Exercise only as directed by your health care provider. Experiencing uterine cramps is a good sign to stop exercising. Continue to eat regular, healthy meals. Wear a good support bra for breast tenderness. Do not use hot tubs, steam rooms, or saunas. Wear your seat belt at all times when driving. Avoid raw meat, uncooked cheese, cat litter boxes, and soil used by cats. These carry germs that can cause birth defects in the baby. Take your prenatal vitamins. Try taking a stool softener (if your health care provider approves) if you develop constipation. Eat more high-fiber foods, such as fresh vegetables or fruit and whole grains. Drink plenty of fluids to keep your urine clear or pale yellow. Take warm sitz baths to soothe any pain or discomfort caused by hemorrhoids. Use hemorrhoid cream if your health care provider approves. If you develop varicose veins, wear support hose. Elevate your feet for 15 minutes, 3-4 times a day. Limit salt in your diet. Avoid heavy lifting, wear low heel shoes, and practice good posture. Rest with your legs elevated if you have leg cramps or low back pain. Visit your dentist if you have not gone yet during your pregnancy. Use a soft toothbrush to brush your teeth  and be gentle when you floss. A sexual relationship may be continued unless your health care provider directs you otherwise. Continue to go to all your prenatal visits as directed by your health care provider. SEEK MEDICAL CARE IF:  You have dizziness. You have mild pelvic cramps, pelvic pressure, or nagging pain in the abdominal area. You have persistent nausea, vomiting, or diarrhea. You have a bad smelling vaginal discharge. You have pain with urination. SEEK IMMEDIATE MEDICAL CARE IF:  You have a fever. You are leaking fluid from your vagina. You have spotting or bleeding from your vagina. You have severe abdominal cramping or pain. You have rapid weight gain or loss. You have shortness of  breath with chest pain. °You notice sudden or extreme swelling of your face, hands, ankles, feet, or legs. °You have not felt your baby move in over an hour. °You have severe headaches that do not go away with medicine. °You have vision changes. °Document Released: 06/17/2001 Document Revised: 06/28/2013 Document Reviewed: 08/24/2012 °ExitCare® Patient Information ©2015 [employer]. This information is not intended to replace advice given to you by your health care provider. Make sure you discuss any questions you have with your health care provider. ° °

## 2021-08-20 NOTE — Progress Notes (Signed)
LOW-RISK PREGNANCY VISIT Patient name: Caitlyn Payorayshauna Buczkowski MRN [id number]  Date of birth: 11/18/2000 Chief Complaint:   Routine Prenatal Visit  History of Present Illness:   Caitlyn Green is a 21 y.o. 142P1001 female at 6782w5d with an Estimated Date of Delivery: 12/12/21 being seen today for ongoing management of a low-risk pregnancy.   Today she reports no complaints. Contractions: Not present.  .  Movement: Present. denies leaking of fluid.  [facility] 05/29/2021 04/30/2020 03/13/2020  Decreased Interest 3 1 0  Down, Depressed, Hopeless 2 1 1   PHQ - 2 Score 5 2 1   Altered sleeping 1 3 1   Tired, decreased energy 2 1 1   Change in appetite 2 1 0  Feeling bad or failure about yourself  2 0 1  Trouble concentrating 1 0 0  Moving slowly or fidgety/restless 0 0 0  Suicidal thoughts 0 0 0  PHQ-9 Score 13 7 4   Difficult doing work/chores - - Not difficult at all     GAD 7 : Generalized Anxiety Score 05/29/2021 04/30/2020 03/13/2020  Nervous, Anxious, on Edge 2 0 1  Control/stop worrying 2 0 0  Worry too much - different things 2 0 1  Trouble relaxing 2 0 0  Restless 1 0 0  Easily annoyed or irritable 3 2 1   Afraid - awful might happen 1 0 0  Total GAD 7 Score 13 2 3   Anxiety Difficulty - - Not difficult at all      Review of Systems:   Pertinent items are noted in HPI Denies abnormal vaginal discharge w/ itching/odor/irritation, headaches, visual changes, shortness of breath, chest pain, abdominal pain, severe nausea/vomiting, or problems with urination or bowel movements unless otherwise stated above. Pertinent History Reviewed:  Reviewed past medical,surgical, social, obstetrical and family history.  Reviewed problem list, medications and allergies. Physical Assessment:   Vitals:   08/20/21 1539  BP: 110/70  Pulse: (!) 108  Weight: 137 lb (62.1 kg)  Body mass index is 19.11 kg/m.        Physical Examination:   General appearance: Well appearing, and in no  distress  Mental status: Alert, oriented to person, place, and time  Skin: Warm & dry  Cardiovascular: Normal heart rate noted  Respiratory: Normal respiratory effort, no distress  Abdomen: Soft, gravid, nontender  Pelvic: Cervical exam deferred         Extremities: Edema: None  Fetal Status: Fetal Heart Rate (bpm): 140 Fundal Height: 16 cm Movement: Present    Chaperone: N/A   No results found for this or any previous visit (from the past 24 hour(s)).  Assessment & Plan:  1) Low-risk pregnancy G2P1001 at 5982w5d with an Estimated Date of Delivery: 12/12/21   2) H/O FGR  3) Uterine size <dates> last efw 55% @ 19.5wks, will get another u/s  4) Recent +CT> POC today   Meds: No orders of the defined types were placed in this encounter.  Labs/procedures today: GC/CT  Plan:  Continue routine obstetrical care  Next visit: prefers will be in person for pn2     Reviewed: Preterm labor symptoms and general obstetric precautions including but not limited to vaginal bleeding, contractions, leaking of fluid and fetal movement were reviewed in detail with the patient.  All questions were answered. Does have home bp cuff. Office bp cuff given: not applicable. Check bp weekly, let us know if consistently >140 and/or >90.  Follow-up: Return for ASAP EFW u/s (Gbso if needed), then  4wks pn2, LROB, EFW u/s here.  No future appointments.  Orders Placed This Encounter  Procedures   GC/Chlamydia Probe Amp   Cheral Marker CNM, [facility] 08/20/2021 4:15 PM

## 2021-08-23 LAB — GC/CHLAMYDIA PROBE AMP
Chlamydia trachomatis, NAA: NEGATIVE
Neisseria Gonorrhoeae by PCR: NEGATIVE

## 2021-08-28 ENCOUNTER — Other Ambulatory Visit: Payer: Self-pay | Admitting: Women's Health

## 2021-08-28 DIAGNOSIS — Z3689 Encounter for other specified antenatal screening: Secondary | ICD-10-CM

## 2021-08-29 ENCOUNTER — Ambulatory Visit: Payer: BC Managed Care – PPO | Attending: Women's Health

## 2021-08-29 ENCOUNTER — Other Ambulatory Visit: Payer: Self-pay

## 2021-08-29 ENCOUNTER — Ambulatory Visit: Payer: BC Managed Care – PPO | Admitting: *Deleted

## 2021-08-29 ENCOUNTER — Ambulatory Visit: Payer: BC Managed Care – PPO | Attending: Women's Health | Admitting: Obstetrics and Gynecology

## 2021-08-29 VITALS — BP 119/63 | HR 101

## 2021-08-29 DIAGNOSIS — M069 Rheumatoid arthritis, unspecified: Secondary | ICD-10-CM

## 2021-08-29 DIAGNOSIS — Z363 Encounter for antenatal screening for malformations: Secondary | ICD-10-CM | POA: Insufficient documentation

## 2021-08-29 DIAGNOSIS — O09892 Supervision of other high risk pregnancies, second trimester: Secondary | ICD-10-CM

## 2021-08-29 DIAGNOSIS — Z3480 Encounter for supervision of other normal pregnancy, unspecified trimester: Secondary | ICD-10-CM

## 2021-08-29 DIAGNOSIS — O99891 Other specified diseases and conditions complicating pregnancy: Secondary | ICD-10-CM

## 2021-08-29 DIAGNOSIS — Z3689 Encounter for other specified antenatal screening: Secondary | ICD-10-CM

## 2021-08-29 DIAGNOSIS — Z3A25 25 weeks gestation of pregnancy: Secondary | ICD-10-CM

## 2021-08-29 DIAGNOSIS — O09292 Supervision of pregnancy with other poor reproductive or obstetric history, second trimester: Secondary | ICD-10-CM

## 2021-08-29 NOTE — Progress Notes (Signed)
Maternal-Fetal Medicine   Name: Caitlyn Green DOB: 10-03-2000 MRN: [id number] Referring Provider: Shawna Green, CNM  I had the pleasure of seeing C[facility] at [facility]. She is G2 P1001 at 21-weeks' gestation and is here for fetal anatomy scan. On integrated screen, the risks for fetal aneuploidies and open neural tube defects are not increased.  Obstetrical history significant for a term vaginal delivery in April 2022 of a female infant weighing 5 pounds and 9 ounces at birth.  Her pregnancy was complicated by fetal growth restriction.  Her baby was discharged with her. GYN history: No history of abnormal Pap smears or cervical surgeries.  No history of breast disease.  Past medical history significant for seropositive rheumatoid arthritis since age 21.  Patient has been taking Humira before her first pregnancy.  In this pregnancy she developed flares with pain and swelling in elbow joint and wrists.  Patient took prednisone 5 mg for 20 days till yesterday.  No history of any other organ involvement.  Patient is being followed by her rheumatologist, Dr. Talbert Cage, [facility]. He advised her to take Cimzia. Medications: Prenatal vitamins, sertraline 25 mg daily.  Allergies: No known drug allergies. Social history: Denies tobacco or drug or alcohol use.  Her partner is [employer], jewellery and he is a father of her first child.  He is in good health. Family history: No history of venous thromboembolism in the family.  Her maternal grandmother has rheumatoid arthritis.  Ultrasound We performed a fetal anatomical survey.  Amniotic fluid is normal and good fetal activity seen.  Fetal biometry is consistent with the previously established dates.  No markers of aneuploidy's or fetal structural defects are seen.  Our concerns include Rheumatoid arthritis Rheumatoid Arthritis is more prevalent in women and about 1.3 million women in the [place] are affected. The  number of pregnant women with RA is estimated to be between 800 and 2,100 annually. Most women with RA report remarkable improvement during pregnancy, which occurs because of the change in immune status in pregnancy.   Disease activity before and during pregnancy is an important factor that influences improvement in pregnancy. More than two-thirds show improvement in pain and swelling. A smaller number (15%) have worsening of symptoms during pregnancy. Flares up to 50% are seen in the postpartum period. Other factor that increases flares during pregnancy is discontinuation of Tumor-Necrosing Factor inhibitors (TNFi) before pregnancy.  Pregnancy outcomes in women with RA are generally favorable. Preterm delivery, PPROM, fetal growth restriction, hypertension and cesarean delivery are increased if active disease is present. Corticosteroids, hydroxychloroquine, DMRDs (disease-modifying rheumatic drugs) have been used safely. All appear to be relatively safe, and the incidence of congenital malformations is not increased above that seen in the general population (1% to 2%).   Cimzia (Certolizumab pegol)) Only limited information is available on its use in pregnancy. Since this antibody is human specific, animal studies are unlikely to give information. Its action is similar to other immunomodulators with TNF? activity. The 2016 European League against Rheumatism (EULAR) guidelines supported use in pregnancy because of low placental passage ([employer] Drugs in Pregnancy and Lactation, 12th Edition).  I counseled the patient that limited information is available on this drug in pregnancy. However, the risk of congenital malformations does not seem to be increased. As with TNF? inhibitors, if the drug is given beyond 30 weeks' gestation, neonatal immunization may have to be delayed till 21 months of age.  History of fetal growth restriction increases  the likelihood of recurrent fetal growth restriction. Patient does  not have risk factors including hypertension. I discussed ultrasound protocol of serial fetal growth assessments.  Recommendations -Fetal growth assessments every 4 weeks that can be performed at your office. -Cimzia can be taken as advised by her rheumatologist.  Consider discontinuing this medication at 30 to [redacted] weeks gestation.  Thank you for consultation.  If you have any questions or concerns, please contact me the [facility].  Consultation including face-to-face (more than 50%) counseling 30 minutes.

## 2021-09-09 ENCOUNTER — Telehealth: Payer: Self-pay | Admitting: Women's Health

## 2021-09-09 DIAGNOSIS — O99012 Anemia complicating pregnancy, second trimester: Secondary | ICD-10-CM

## 2021-09-09 NOTE — Telephone Encounter (Signed)
Returned pt's call, two identifiers used. Pt stated that her symptoms of pica had increased over the past weekend. Pt states that her prenatal vitamin has iron in it and she has been trying to eat more iron-rich foods. Discussed with Knute Neu and CBC lab ordered. Pt placed on lab schedule for 3/7. Pt confirmed understanding. ?

## 2021-09-09 NOTE — Telephone Encounter (Signed)
Patient called stating that she is eating a lot of ice and is craving dirt, and wants to know if that could be a sign of low iron. Wants someone to call her...  ?

## 2021-09-09 NOTE — Addendum Note (Signed)
Addended by: Leilani Able, Charli Halle A on: 09/09/2021 01:56 PM ? ? Modules accepted: Orders ? ?

## 2021-09-10 ENCOUNTER — Other Ambulatory Visit: Payer: BC Managed Care – PPO

## 2021-09-14 ENCOUNTER — Inpatient Hospital Stay (HOSPITAL_BASED_OUTPATIENT_CLINIC_OR_DEPARTMENT_OTHER): Payer: BC Managed Care – PPO

## 2021-09-14 ENCOUNTER — Other Ambulatory Visit: Payer: Self-pay

## 2021-09-14 ENCOUNTER — Encounter (HOSPITAL_COMMUNITY): Payer: Self-pay | Admitting: Obstetrics & Gynecology

## 2021-09-14 ENCOUNTER — Inpatient Hospital Stay (HOSPITAL_COMMUNITY)
Admission: AD | Admit: 2021-09-14 | Discharge: 2021-09-14 | Disposition: A | Discharge status: Home / Self Care | Payer: BC Managed Care – PPO | Attending: Obstetrics & Gynecology | Admitting: Obstetrics & Gynecology

## 2021-09-14 DIAGNOSIS — O26843 Uterine size-date discrepancy, third trimester: Secondary | ICD-10-CM | POA: Diagnosis not present

## 2021-09-14 DIAGNOSIS — M545 Low back pain, unspecified: Secondary | ICD-10-CM

## 2021-09-14 DIAGNOSIS — Z3A27 27 weeks gestation of pregnancy: Secondary | ICD-10-CM

## 2021-09-14 DIAGNOSIS — Z3480 Encounter for supervision of other normal pregnancy, unspecified trimester: Secondary | ICD-10-CM

## 2021-09-14 DIAGNOSIS — O09292 Supervision of pregnancy with other poor reproductive or obstetric history, second trimester: Secondary | ICD-10-CM | POA: Diagnosis not present

## 2021-09-14 DIAGNOSIS — Z3689 Encounter for other specified antenatal screening: Secondary | ICD-10-CM

## 2021-09-14 DIAGNOSIS — O2602 Excessive weight gain in pregnancy, second trimester: Secondary | ICD-10-CM | POA: Insufficient documentation

## 2021-09-14 DIAGNOSIS — O2600 Excessive weight gain in pregnancy, unspecified trimester: Secondary | ICD-10-CM | POA: Insufficient documentation

## 2021-09-14 DIAGNOSIS — O26892 Other specified pregnancy related conditions, second trimester: Secondary | ICD-10-CM | POA: Diagnosis not present

## 2021-09-14 DIAGNOSIS — R102 Pelvic and perineal pain: Secondary | ICD-10-CM | POA: Diagnosis not present

## 2021-09-14 DIAGNOSIS — R109 Unspecified abdominal pain: Secondary | ICD-10-CM

## 2021-09-14 DIAGNOSIS — O26849 Uterine size-date discrepancy, unspecified trimester: Secondary | ICD-10-CM

## 2021-09-14 LAB — URINALYSIS, ROUTINE W REFLEX MICROSCOPIC
Bacteria, UA: NONE SEEN
Bilirubin Urine: NEGATIVE
Glucose, UA: NEGATIVE mg/dL
Hgb urine dipstick: NEGATIVE
Ketones, ur: NEGATIVE mg/dL
Nitrite: NEGATIVE
Protein, ur: NEGATIVE mg/dL
Specific Gravity, Urine: 1.005 (ref 1.005–1.030)
pH: 6 (ref 5.0–8.0)

## 2021-09-14 LAB — WET PREP, GENITAL
Sperm: NONE SEEN
Trich, Wet Prep: NONE SEEN
WBC, Wet Prep HPF POC: >=10 — AB (ref <10)
Yeast Wet Prep HPF POC: NONE SEEN

## 2021-09-14 MED ORDER — CYCLOBENZAPRINE HCL 5 MG PO TABS
5.0000 mg | ORAL_TABLET | Freq: Once | ORAL | Status: AC
  Administered 2021-09-14: 5 mg via ORAL
  Filled 2021-09-14: qty 1

## 2021-09-14 MED ORDER — ACETAMINOPHEN 500 MG PO TABS
1000.0000 mg | ORAL_TABLET | Freq: Once | ORAL | Status: AC
  Administered 2021-09-14: 1000 mg via ORAL
  Filled 2021-09-14: qty 2

## 2021-09-14 NOTE — MAU Provider Note (Cosign Needed)
History     CSN: [id number]  Arrival date and time: 09/14/21 1652   Event Date/Time   First Provider Initiated Contact with Patient 09/14/21 1745      Chief Complaint  Patient presents with   Abdominal Pain   Vaginal Pain   Back Pain   [name] is a 21 y.o. G2P1001 at [redacted]w[redacted]d who presents to MAU for low back pain, midline lower abdominal pain and vaginal pain. Patient is also concerned because she believes that her stomach looks smaller than it has in the past this pregnancy and states that she did have low fluid with her previous baby. Patient reports normal growth with this baby, but states she has not been able to gain weight. Patient reports she also did not gain weight appropriately with her previous pregnancy.  Patient reports she does have intermittent back pain, but denies abdominal or vaginal pain previously. Patient reports symptoms started about 3PM today after getting off of work. 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 Patient reports the pain is present constantly and is worse with baby's movement, but not worse when she moves. Patient reports lying down makes it better. Patient denies taking any medication for pain. Patient rates pain as 4/10. Patient describes the pain as sharp.  Patient's grandmother present for entire visit.  Pt denies VB, LOF, ctx, decreased FM, vaginal discharge/odor/itching. Pt denies N/V, constipation, diarrhea, or urinary problems. Pt denies fever, chills, fatigue, sweating or changes in appetite. Pt denies SOB or chest pain. Pt denies dizziness, HA, light-headedness, weakness.  Problems this pregnancy include: RA, hx FGR. Allergies? NKDA Current medications/supplements? PNV, Zoloft QOD [facility] care provider? [facility], next appt 09/17/2021   OB History     Gravida  2   Para  1   Term  1   Preterm  0   AB  0   Living  1       SAB  0   IAB  0   Ectopic  0   Multiple  0   Live Births  1           Past Medical History:  Diagnosis Date   Back strain    Rheumatoid arthritis ([place])     Past Surgical History:  Procedure Laterality Date   NO PAST SURGERIES      Family History  Problem Relation Age of Onset   Healthy Mother    Cancer Father    Arthritis Maternal Grandmother    Healthy Sister    Healthy Sister    Healthy Brother     Social History   Tobacco Use   Smoking status: Never   Smokeless tobacco: Never  Vaping Use   Vaping Use: Former  Substance Use Topics   Alcohol use: No   Drug use: No    Allergies: No Known Allergies  Medications Prior to Admission  Medication Sig Dispense Refill Last Dose   acetaminophen (TYLENOL) 500 MG tablet Take 500 mg by mouth every 6 (six) hours as needed.      prenatal vitamin w/FE, FA (PRENATAL 1 + 1) 27-1 MG TABS tablet Take 1 tablet by mouth daily at 12 noon. 30 tablet 12    promethazine (PHENERGAN) 25 MG tablet Take 1 tablet (25 mg total) by mouth every 6 (six) hours as needed for nausea or vomiting. 30 tablet 1    sertraline (ZOLOFT)  25 MG tablet Take 1 tablet (25 mg total) by mouth daily. Take half tablet for one week then increase to one tablet daily 30 tablet 2     Review of Systems  Constitutional:  Negative for chills, diaphoresis, fatigue and fever.  Eyes:  Negative for visual disturbance.  Respiratory:  Negative for shortness of breath.   Cardiovascular:  Negative for chest pain.  Gastrointestinal:  Positive for abdominal pain. Negative for constipation, diarrhea, nausea and vomiting.  Genitourinary:  Positive for vaginal pain. Negative for dysuria, flank pain, frequency, pelvic pain, urgency, vaginal bleeding and vaginal discharge.  Musculoskeletal:  Positive for back pain.  Neurological:  Negative for dizziness, weakness, light-headedness and headaches.   Physical Exam   Blood pressure 104/69, pulse 81, temperature 98.1 F (36.7  C), temperature source Oral, resp. rate 16, height 5\' 11"  (1.803 m), weight 62.9 kg, last menstrual period 03/07/2021, SpO2 100 %, not currently breastfeeding.  Patient Vitals for the past 24 hrs:  BP Temp Temp src Pulse Resp SpO2 Height Weight  09/14/21 1705 104/69 98.1 F (36.7 C) Oral 81 16 100 % 5\' 11"  (1.803 m) 62.9 kg   Physical Exam Constitutional:      General: She is not in acute distress.    Appearance: She is well-developed. She is not diaphoretic.  HENT:     Head: Normocephalic and atraumatic.  Pulmonary:     Effort: Pulmonary effort is normal.  Abdominal:     General: There is no distension.     Palpations: Abdomen is soft. There is no mass.     Tenderness: There is no abdominal tenderness. There is no guarding or rebound.  Skin:    General: Skin is warm and dry.  Neurological:     Mental Status: She is alert and oriented to person, place, and time.  Psychiatric:        Behavior: Behavior normal.        Thought Content: Thought content normal.        Judgment: Judgment normal.   Results for orders placed or performed during the hospital encounter of 09/14/21 (from the past 24 hour(s))  Urinalysis, Routine w reflex microscopic Urine, Clean Catch     Status: Abnormal   Collection Time: 09/14/21  5:47 PM  Result Value Ref Range   Color, Urine STRAW (A) YELLOW   APPearance CLEAR CLEAR   Specific Gravity, Urine 1.005 1.005 - 1.030   pH 6.0 5.0 - 8.0   Glucose, UA NEGATIVE NEGATIVE mg/dL   Hgb urine dipstick NEGATIVE NEGATIVE   Bilirubin Urine NEGATIVE NEGATIVE   Ketones, ur NEGATIVE NEGATIVE mg/dL   Protein, ur NEGATIVE NEGATIVE mg/dL   Nitrite NEGATIVE NEGATIVE   Leukocytes,Ua TRACE (A) NEGATIVE   RBC / HPF 0-5 0 - 5 RBC/hpf   WBC, UA 0-5 0 - 5 WBC/hpf   Bacteria, UA NONE SEEN NONE SEEN   Squamous Epithelial / LPF 0-5 0 - 5   [place] MFM OB DETAIL +14 WK  Result Date: 08/29/2021 ----------------------------------------------------------------------  OBSTETRICS  REPORT                       (Signed Final 08/29/2021 04:30 pm) ---------------------------------------------------------------------- Patient Info  ID #:       [id number]                          D.O.B.:  12-13-2000 (20 yrs)  Name:  Caitlyn [facility]                 Visit Date: 08/29/2021 03:08 pm ---------------------------------------------------------------------- Performed By  Attending:        Tama High MD        Ref. Address:     [facility]                                                             OB/GYN                                                             [place]                                                             [place]  Performed By:     Benson [place]          [facility]                                                             [place] [facility]                                                             [facility]  Referred By:      Roma Schanz CNM ---------------------------------------------------------------------- Orders  #  Description                           Code        Ordered By  1  [place] MFM OB DETAIL +14 [place]  [id number]    Wells Guiles ----------------------------------------------------------------------  #  Order #                     Accession #                Episode #  1  [id number]                   [id number]                 [id number] ---------------------------------------------------------------------- Indications  Medical complication of pregnancy              O26.90  (rheumatoid arthritis)  Poor obstetric history: Previous fetal growth  O09.299  restriction (FGR)  [redacted] weeks gestation of pregnancy                Z3A.25  Encounter for antenatal screening for          Z36.3  malformations  Short interval between pregancies, 2nd         O09.892  trimester  ---------------------------------------------------------------------- Fetal Evaluation  Num Of Fetuses:         1  Fetal Heart Rate(bpm):  152  Cardiac Activity:       Observed  Presentation:           Cephalic  Placenta:               Right lateral  P. Cord Insertion:      Visualized, central  Amniotic Fluid  AFI FV:      Within normal limits                              Largest Pocket(cm)                              3.9 ---------------------------------------------------------------------- Biometry  BPD:      62.5  mm     G. Age:  25w 2d         54  %    CI:        79.53   %    70 - 86                                                          FL/HC:      21.0   %    18.7 - 20.3  HC:      221.5  mm     G. Age:  24w 1d          8  %    HC/AC:      1.12        1.04 - 1.22  AC:      198.6  mm     G. Age:  24w 4d         26  %    FL/BPD:     74.6   %    71 - 87  FL:       46.6  mm     G. Age:  25w 4d         53  %    FL/AC:      23.5   %  20 - 24  HUM:      44.8  mm     G. Age:  26w 4d         82  %  CER:      28.2  mm     G. Age:  25w 0d         53  %  LV:        4.4  mm  CM:        8.3  mm  Est. FW:     746  gm    1 lb 10 oz      35  % ---------------------------------------------------------------------- OB History  Gravidity:    2         Term:   1  Living:       1 ---------------------------------------------------------------------- Gestational Age  LMP:           25w 0d        Date:  03/07/21                 EDD:   12/12/21  U/S Today:     24w 6d                                        EDD:   12/13/21  Best:          Melvyn Novas 0d     Det. By:  LMP  (03/07/21)          EDD:   12/12/21 ---------------------------------------------------------------------- Anatomy  Cranium:               Appears normal         LVOT:                   Appears normal  Cavum:                 Appears normal         Aortic Arch:            Appears normal  Ventricles:            Appears normal         Ductal Arch:            Appears normal   Choroid Plexus:        Appears normal         Diaphragm:              Appears normal  Cerebellum:            Appears normal         Stomach:                Appears normal, left                                                                        sided  Posterior Fossa:       Appears normal         Abdomen:                Appears normal  Nuchal Fold:  Not applicable (>20    Abdominal Wall:         Appears nml (cord                         wks GA)                                        insert, abd wall)  Face:                  Appears normal         Cord Vessels:           Appears normal (3                         (orbits and profile)                           vessel cord)  Lips:                  Appears normal         Kidneys:                Appear normal  Palate:                Not well visualized    Bladder:                Appears normal  Thoracic:              Appears normal         Spine:                  Ltd views no                                                                        intracranial signs of                                                                        NTD  Heart:                 Appears normal         Upper Extremities:      Visualized                         (4CH, axis, and                         situs)  RVOT:                  Appears normal         Lower Extremities:      Visualized  Other:  Fetus appears to be female. Nasal  bone visualized. Hands and feet          not well visualized. Lenses visualized. VC, 3VV and visualized. ---------------------------------------------------------------------- Cervix Uterus Adnexa  Cervix  Length:           3.07  cm.  Normal appearance by transabdominal scan.  Adnexa  No abnormality visualized. ---------------------------------------------------------------------- Impression  We performed a fetal anatomical survey.  Amniotic fluid is  normal and good fetal activity seen.  Fetal biometry is  consistent with the previously established  dates.  No markers  of aneuploidy's or fetal structural defects are seen.  xxxxxxxxxxxxxxxxxxxxxxxxxxxxxxxxxxxxxxxx  Consultation (see EPIC )  I had the pleasure of seeing Ms. Perren [facility] at [facility]. She is G2 P1001 at 25-weeks'  gestation and is here for fetal anatomy scan.  On integrated screen, the risks for fetal aneuploidies and  open neural tube defects are not increased.  Obstetrical history significant for a term vaginal delivery in  April 2022 of a female infant weighing 5 pounds and 9 ounces  at birth.  Her pregnancy was complicated by fetal growth  restriction.  Her baby was discharged with her.  GYN history: No history of abnormal Pap smears or cervical  surgeries.  No history of breast disease.  Past medical history significant for seropositive rheumatoid  arthritis since age 74.  Patient has been taking Humira before  her first pregnancy.  In this pregnancy she developed flares  with pain and swelling in elbow joint and wrists.  Patient took  prednisone 5 mg for 20 days till yesterday.  No history of any  other organ involvement.  Patient is being followed by her rheumatologist, Dr. Lollie Sails,  [facility]. He advised her to take Cimzia.  Medications: Prenatal vitamins, sertraline 25 mg daily.  Allergies: No known drug allergies.  Social history: Denies tobacco or drug or alcohol use.  Her  partner is Native Clinical cytogeneticist and he is a father of  her first child.  He is in good health.  Family history: No history of venous thromboembolism in the  family.  Her maternal grandmother has rheumatoid arthritis.  Our concerns include  Rheumatoid arthritis  Rheumatoid Arthritis is more prevalent in women and about  1.3 million women in the [place] are affected. The number of  pregnant women with RA is estimated to be between 800 and  2,100 annually. Most women with RA report remarkable  improvement during pregnancy, which occurs because of the  change in immune status in pregnancy.   Disease activity before and during pregnancy is an important  factor that influences improvement in pregnancy. More than  two-thirds show improvement in pain and swelling. A smaller  number (15%) have worsening of symptoms during  pregnancy. Flares up to 50% are seen in the postpartum  period. Other factor that increases flares during pregnancy is  discontinuation of Tumor-Necrosing Factor inhibitors (TNFi)  before pregnancy.  Pregnancy outcomes in women with RA are generally  favorable. Preterm delivery, PPROM, fetal growth restriction,  hypertension and cesarean delivery are increased if active  disease is present. Corticosteroids, hydroxychloroquine,  DMRDs (disease-modifying rheumatic drugs) have been used  safely. All appear to be relatively safe, and the incidence of  congenital malformations is not increased above that seen in  the general population (1% to 2%).  Cimzia (Certolizumab pegol))  Only limited information is available on its use in pregnancy.  Since this antibody is human specific, animal studies are  unlikely to give  information. Its action is similar to other  immunomodulators with TNF? activity. The 2016 European  League against Rheumatism (EULAR) guidelines supported  use in pregnancy because of low placental passage ([employer]  Drugs in Pregnancy and Lactation, 12th Edition).  I counseled the patient that limited information is available on  this drug in pregnancy. However, the risk of congenital  malformations does not seem to be increased. As with TNF?  inhibitors, if the drug is given beyond 30 weeks' gestation,  neonatal immunization may have to be delayed till 65 months  of age.  History of fetal growth restriction increases the likelihood of  recurrent fetal growth restriction. Patient does not have risk  factors including hypertension. I discussed ultrasound  protocol of serial fetal growth assessments. ---------------------------------------------------------------------- Recommendations   -Fetal growth assessments every 4 weeks that can be  performed at your office.  -Cimzia can be taken as advised by her rheumatologist.  Consider discontinuing this medication at 30 to [redacted] weeks  gestation. ----------------------------------------------------------------------                  Tama High, MD Electronically Signed Final Report   08/29/2021 04:30 pm ----------------------------------------------------------------------   MAU Course  Procedures  MDM -abdominal/vaginal/low back sharp pains -rates 4/10 -Tylenol 1000mg  and Flexeril 10mg  given -FH 25, hx IUGR -UA: straw/trace leuks, sending urine for culture based on symptoms -after medication administration, pt reports pain relieved entirely in vagina and lower abdomen and rates low back pain now 1/10 -WetPrep/GC/CT pending EFM: reactive       -baseline: 135       -variability: moderate       -accels: present, 15x15       -decels: absent       -TOCO: quiet -CE: long/closed/posterior -pt declines RX for Flexeril at this time, encouraged to discuss with OB if changes mind -pt discharged to home in stable condition  Orders Placed This Encounter  Procedures   Wet prep, genital    Standing Status:   Standing    Number of Occurrences:   1   Culture, OB Urine    Standing Status:   Standing    Number of Occurrences:   1   [place] MFM OB LIMITED    Size < Dates    Standing Status:   Standing    Number of Occurrences:   1    Order Specific Question:   Symptom/Reason for Exam    Answer:   Uterine size date discrepancy pregnancy [[id number]]   Urinalysis, Routine w reflex microscopic Urine, Clean Catch    Standing Status:   Standing    Number of Occurrences:   1   Ambulatory referral to Nutrition and Diabetic Education    Referral Priority:   Routine    Referral Type:   Consultation    Referral Reason:   Specialty Services Required    Number of Visits Requested:   1   Discharge patient    Order Specific Question:   Discharge  disposition    Answer:   01-Home or Self Care [1]    Order Specific Question:   Discharge patient date    Answer:   09/14/2021   Meds ordered this encounter  Medications   acetaminophen (TYLENOL) tablet 1,000 mg   cyclobenzaprine (FLEXERIL) tablet 5 mg   Assessment and Plan   1. Supervision of other normal pregnancy, antepartum   2. Uterine size date discrepancy pregnancy   3. Abdominal pain during pregnancy in second trimester  4. [redacted] weeks gestation of pregnancy   5. NST (non-stress test) reactive   6. Vaginal pain   7. Low back pain during pregnancy in second trimester   8. Abnormal weight gain in pregnancy    Allergies as of 09/14/2021   No Known Allergies      Medication List     TAKE these medications    acetaminophen 500 MG tablet Commonly known as: TYLENOL Take 500 mg by mouth every 6 (six) hours as needed.   prenatal vitamin w/FE, FA 27-1 MG Tabs tablet Take 1 tablet by mouth daily at 12 noon.   promethazine 25 MG tablet Commonly known as: PHENERGAN Take 1 tablet (25 mg total) by mouth every 6 (six) hours as needed for nausea or vomiting.   sertraline 25 MG tablet Commonly known as: Zoloft Take 1 tablet (25 mg total) by mouth daily. Take half tablet for one week then increase to one tablet daily       -nutrition consult d/t difficulty gaining weight -return MAU precautions given -pt discharged to home in stable condition  Elmyra Ricks E Abdulrahman Bracey 09/14/2021, 8:00 PM

## 2021-09-14 NOTE — MAU Note (Signed)
[name] is a 21 y.o. at 5651w2d here in MAU reporting: having pain in lower abd, lower back and vaginal area.  Has been pretty constant. Feels like "her stomach has shrunk". No bleeding or LOF. Reports +FM ? ?Onset of complaint: 1500 ?Pain score: 4 ?Vitals:  ? 09/14/21 1705  ?BP: 104/69  ?Pulse: 81  ?Resp: 16  ?Temp: 98.1 ?F (36.7 ?C)  ?SpO2: 100%  ?   ?FHT:162 ?Lab orders placed from triage:  u/a ?

## 2021-09-16 ENCOUNTER — Other Ambulatory Visit: Payer: Self-pay | Admitting: Women's Health

## 2021-09-16 DIAGNOSIS — O365920 Maternal care for other known or suspected poor fetal growth, second trimester, not applicable or unspecified: Secondary | ICD-10-CM

## 2021-09-16 DIAGNOSIS — O09299 Supervision of pregnancy with other poor reproductive or obstetric history, unspecified trimester: Secondary | ICD-10-CM

## 2021-09-16 LAB — CULTURE, OB URINE: Culture: <10000 — AB

## 2021-09-16 LAB — GC/CHLAMYDIA PROBE AMP (~~LOC~~) NOT AT ARMC
Chlamydia: NEGATIVE
Comment: NEGATIVE
Comment: NORMAL
Neisseria Gonorrhea: NEGATIVE

## 2021-09-17 ENCOUNTER — Other Ambulatory Visit: Payer: Self-pay

## 2021-09-17 ENCOUNTER — Ambulatory Visit (INDEPENDENT_AMBULATORY_CARE_PROVIDER_SITE_OTHER): Payer: BC Managed Care – PPO

## 2021-09-17 ENCOUNTER — Other Ambulatory Visit: Payer: BC Managed Care – PPO

## 2021-09-17 ENCOUNTER — Ambulatory Visit (INDEPENDENT_AMBULATORY_CARE_PROVIDER_SITE_OTHER): Payer: BC Managed Care – PPO | Admitting: Women's Health

## 2021-09-17 ENCOUNTER — Encounter: Payer: Self-pay | Admitting: Women's Health

## 2021-09-17 VITALS — BP 105/66 | HR 91 | Wt 141.4 lb

## 2021-09-17 DIAGNOSIS — Z3482 Encounter for supervision of other normal pregnancy, second trimester: Secondary | ICD-10-CM

## 2021-09-17 DIAGNOSIS — O09299 Supervision of pregnancy with other poor reproductive or obstetric history, unspecified trimester: Secondary | ICD-10-CM | POA: Diagnosis not present

## 2021-09-17 DIAGNOSIS — O365920 Maternal care for other known or suspected poor fetal growth, second trimester, not applicable or unspecified: Secondary | ICD-10-CM

## 2021-09-17 DIAGNOSIS — Z3A27 27 weeks gestation of pregnancy: Secondary | ICD-10-CM | POA: Diagnosis not present

## 2021-09-17 DIAGNOSIS — Z131 Encounter for screening for diabetes mellitus: Secondary | ICD-10-CM

## 2021-09-17 DIAGNOSIS — Z23 Encounter for immunization: Secondary | ICD-10-CM

## 2021-09-17 DIAGNOSIS — Z3480 Encounter for supervision of other normal pregnancy, unspecified trimester: Secondary | ICD-10-CM

## 2021-09-17 MED ORDER — METRONIDAZOLE 500 MG PO TABS: 500.0000 mg | ORAL_TABLET | Freq: Two times a day (BID) | ORAL | 0 refills | Status: DC

## 2021-09-17 NOTE — Patient Instructions (Signed)
Netha, thank you for choosing our office today! We appreciate the opportunity to meet your healthcare needs. You may receive a short survey by mail, e-mail, or through [employer]. If you are happy with your care we would appreciate if you could take just a few minutes to complete the survey questions. We read all of your comments and take your feedback very seriously. Thank you again for choosing our office.  ?Center for [employer] Team at [facility] ? ?[facility] at [facility] ?([address] [place], [place] [address]) ?Entrance C, located off of [address] ?Free 24/7 valet parking  ? ?CLASSES: Go to [email or web address] to register for classes (childbirth, breastfeeding, waterbirth, infant CPR, daddy bootcamp, etc.) ? ?Call the office [phone]) or go to [facility] if: ?You begin to have strong, frequent contractions ?Your water breaks.  Sometimes it is a big gush of fluid, sometimes it is just a trickle that keeps getting your panties wet or running down your legs ?You have vaginal bleeding.  It is normal to have a small amount of spotting if your cervix was checked.  ?You don't feel your baby moving like normal.  If you don't, get you something to eat and drink and lay down and focus on feeling your baby move.   If your baby is still not moving like normal, you should call the office or go to [facility]. ? ?Call the office [phone]) or go to [facility] hospital for these signs of pre-eclampsia: ?Severe headache that does not go away with Tylenol ?Visual changes- seeing spots, double, blurred vision ?Pain under your right breast or upper abdomen that does not go away with Tums or heartburn medicine ?Nausea and/or vomiting ?Severe swelling in your hands, feet, and face  ? ?Tdap Vaccine ?It is recommended that you get the Tdap vaccine during the third trimester of EACH pregnancy to help protect your baby from getting pertussis (whooping cough) ?27-36 weeks is the BEST time to do  this so that you can pass the protection on to your baby. During pregnancy is better than after pregnancy, but if you are unable to get it during pregnancy it will be offered at the hospital.  ?You can get this vaccine with [place], at the health department, your family doctor, or some local pharmacies ?Everyone who will be around your baby should also be up-to-date on their vaccines before the baby comes. Adults (who are not pregnant) only need 1 dose of Tdap during adulthood.  ? ?[place] Pediatricians/Family Doctors ?[facility]): [address] [place], [phone]           ?[facility]: [address], [phone]                ?[facility]): [place], [phone] (call to ask if accepting patients) ?[facility]: [address], [place], [place]   ? ?[place] Pediatricians/Family Doctors ?[facility]): [address], [address], [phone] ?[place]: [address] [place], [phone] ?[facility] [place]: [place], [phone] ? ?[place]  ?[place] [facility]): [phone] ?[facility]: [place], [phone]  ? ?[place] [address] [place]: [place] [address] [phone] ? ?[place]  ?[facility]: [phone], [phone] ? ?Home Blood Pressure Monitoring for Patients  ? ?Your provider has recommended that you check your  blood pressure (BP) at least once a week at home. If you do not have a blood pressure cuff at home, one will be provided for you. Contact your provider if you have not received your monitor within 1 week.  ? ?Helpful Tips for Accurate Home Blood Pressure Checks  ?Don't smoke, exercise, or drink caffeine 30 minutes before checking your BP ?Use the restroom before checking your BP (a full bladder can raise your  pressure) ?Relax in a comfortable upright chair ?Feet on the ground ?Left arm resting comfortably on a flat surface at the level of your heart ?Legs uncrossed ?Back supported ?Sit quietly and don't talk ?Place the cuff on your bare arm ?Adjust snuggly, so that only two fingertips can fit between your skin and the top of the cuff ?Check 2 readings separated by at least one minute ?Keep a log of your BP readings ?For a visual, please reference this diagram: http://ccnc.care/bpdiagram ? ?Provider Name: [facility]     Phone: [phone] ? ?Zone 1: ALL CLEAR  ?Continue to monitor your symptoms:  ?BP reading is less than 140 (top number) or less than 90 (bottom number)  ?No right upper stomach pain ?No headaches or seeing spots ?No feeling nauseated or throwing up ?No swelling in face and hands ? ?Zone 2: CAUTION ?Call your doctor's office for any of the following:  ?BP reading is greater than 140 (top number) or greater than 90 (bottom number)  ?Stomach pain under your ribs in the middle or right side ?Headaches or seeing spots ?Feeling nauseated or throwing up ?Swelling in face and hands ? ?Zone 3: EMERGENCY  ?Seek immediate medical care if you have any of the following:  ?BP reading is greater than160 (top number) or greater than 110 (bottom number) ?Severe headaches not improving with Tylenol ?Serious difficulty catching your breath ?Any worsening symptoms from Zone 2  ? ?Third Trimester of Pregnancy ?The third trimester is from week 29 through week 42, months 7 through 9. The third trimester is a time when the fetus is growing rapidly. At the end of the ninth month, the fetus is about 20 inches in length and weighs 6-10 pounds.  ?BODY CHANGES ?Your body goes through many changes during pregnancy. The changes vary from woman to woman.  ?Your weight will continue to increase. You can expect to gain 25-35 pounds (11-16 kg) by the end of the pregnancy. ?You may begin to get stretch marks on your hips, abdomen,  and breasts. ?You may urinate more often because the fetus is moving lower into your pelvis and pressing on your bladder. ?You may develop or continue to have heartburn as a result of your pregnancy. ?You may develop constipation because certain hormones are causing the muscles that push waste through your intestines to slow down. ?You may develop hemorrhoids or swollen, bulging veins (varicose veins). ?You may have pelvic pain because of the weight gain and pregnancy hormones relaxing your joints between the bones in your pelvis. Backaches may result from overexertion of the muscles supporting your posture. ?You may have changes in your hair. These can include thickening of your hair, rapid growth, and changes in texture. Some women also have hair loss during or after pregnancy, or hair that feels dry or thin. Your hair will most likely return to normal after your baby is born. ?Your breasts will continue to grow and be tender. A yellow discharge may leak from your breasts called colostrum. ?Your belly button may stick out. ?You may  feel short of breath because of your expanding uterus. ?You may notice the fetus "dropping," or moving lower in your abdomen. ?You may have a bloody mucus discharge. This usually occurs a few days to a week before labor begins. ?Your cervix becomes thin and soft (effaced) near your due date. ?WHAT TO EXPECT AT [place]  ?You will have prenatal exams every 2 weeks until week 36. Then, you will have weekly prenatal exams. During a routine prenatal visit: ?You will be weighed to make sure you and the fetus are growing normally. ?Your blood pressure is taken. ?Your abdomen will be measured to track your baby's growth. ?The fetal heartbeat will be listened to. ?Any test results from the previous visit will be discussed. ?You may have a cervical check near your due date to see if you have effaced. ?At around 36 weeks, your caregiver will check your cervix. At the same time, your  caregiver will also perform a test on the secretions of the vaginal tissue. This test is to determine if a type of bacteria, Group B streptococcus, is present. Your caregiver will explain this further. ?Hope Budds

## 2021-09-17 NOTE — Progress Notes (Signed)
[place] 27+5 wks,cephalic,right lateral placenta gr 0,CX 2.9 cm,FHR 142 bpm,AFI 12.7 cm,EFW 1085 g 30% ?

## 2021-09-17 NOTE — Progress Notes (Signed)
? ? ?LOW-RISK PREGNANCY VISIT ?Patient name: Caitlyn Green MRN [id number]  Date of birth: 08/20/2000 ?Chief Complaint:   ?Routine Prenatal Visit, Pregnancy Ultrasound, and PN2 ? ?History of Present Illness:   ?Caitlyn Green is a 21 y.o. 562P1001 female at 1368w5d with an Estimated Date of Delivery: 12/12/21 being seen today for ongoing management of a low-risk pregnancy.  ? ?Today she reports no complaints. Went to MAU 3/11 w/ pain, cx long/closed, has questions about urine and results. Has RA and was having flares in arms/elbows, rx'd cimzia, actually hasn't started yet, b/c just received it. Had u/s w/ MFM 2/23, they recommended coming off of cimzia around 30-32wks d/t inability of baby to get immunizations til 6mths if pt continues med til birth. Contractions: Not present. Vag. Bleeding: None.  Movement: Present. denies leaking of fluid. ? ?Depression screen [facility] 2/9 05/29/2021 04/30/2020 03/13/2020  ?Decreased Interest 3 1 0  ?Down, Depressed, Hopeless 2 1 1   ?PHQ - 2 Score 5 2 1   ?Altered sleeping 1 3 1   ?Tired, decreased energy 2 1 1   ?Change in appetite 2 1 0  ?Feeling bad or failure about yourself  2 0 1  ?Trouble concentrating 1 0 0  ?Moving slowly or fidgety/restless 0 0 0  ?Suicidal thoughts 0 0 0  ?PHQ-9 Score 13 7 4   ?Difficult doing work/chores - - Not difficult at all  ? ?  ?GAD 7 : Generalized Anxiety Score 05/29/2021 04/30/2020 03/13/2020  ?Nervous, Anxious, on Edge 2 0 1  ?Control/stop worrying 2 0 0  ?Worry too much - different things 2 0 1  ?Trouble relaxing 2 0 0  ?Restless 1 0 0  ?Easily annoyed or irritable 3 2 1   ?Afraid - awful might happen 1 0 0  ?Total GAD 7 Score 13 2 3   ?Anxiety Difficulty - - Not difficult at all  ? ? ?  ?Review of Systems:   ?Pertinent items are noted in HPI ?Denies abnormal vaginal discharge w/ itching/odor/irritation, headaches, visual changes, shortness of breath, chest pain, abdominal pain, severe nausea/vomiting, or problems with urination or bowel movements unless  otherwise stated above. ?Pertinent History Reviewed:  ?Reviewed past medical,surgical, social, obstetrical and family history.  ?Reviewed problem list, medications and allergies. ?Physical Assessment:  ? ?Vitals:  ? 09/17/21 0918  ?BP: 105/66  ?Pulse: 91  ?Weight: 141 lb 6.4 oz (64.1 kg)  ?Body mass index is 19.72 kg/m?. ?  ?     Physical Examination:  ? General appearance: Well appearing, and in no distress ? Mental status: Alert, oriented to person, place, and time ? Skin: Warm & dry ? Cardiovascular: Normal heart rate noted ? Respiratory: Normal respiratory effort, no distress ? Abdomen: Soft, gravid, nontender ? Pelvic: Cervical exam deferred        ? Extremities: Edema: None ? ?Fetal Status: Fetal Heart Rate (bpm): 142 u/s   Movement: Present  US 27+5 wks,cephalic,right lateral placenta gr 0,CX 2.9 cm,FHR 142 bpm,AFI 12.7 cm,EFW 1085 g 30% ?  ? ?Chaperone: N/A   ?No results found for this or any previous visit (from the past 24 hour(s)).  ?Assessment & Plan:  ?1) Low-risk pregnancy G2P1001 at 8368w5d with an Estimated Date of Delivery: 12/12/21  ? ?2) H/O fGR, EFW today 30%, repeat q4wks ? ?3) BV> on recent wet prep 3/11, no sx, however will rx flagyl d/t ^r/f PTL ? ?4) RA> flares in elbows, rx'd cimzia by rheumatologist, hasn't started yet- just came in, MFM recommends stopping at 30-32wks (which  is in 2wks) d/t baby's inability to get immunizations until if pt remains on til birth. Pt ok not starting. Recommended she contact rheumatologist to discuss further ?  ?Meds:  ?Meds ordered this encounter  ?Medications  ? metroNIDAZOLE (FLAGYL) 500 MG tablet  ?  Sig: Take 1 tablet (500 mg total) by mouth 2 (two) times daily.  ?  Dispense:  14 tablet  ?  Refill:  0  ?  Order Specific Question:   Supervising Provider  ?  Answer:   Duane Lope H [[id number]]  ? ?Labs/procedures today: tdap, U/S, and PN2 ? ?Plan:  Continue routine obstetrical care  ?Next visit: prefers in person   ? ?Reviewed: Preterm labor symptoms and  general obstetric precautions including but not limited to vaginal bleeding, contractions, leaking of fluid and fetal movement were reviewed in detail with the patient.  All questions were answered. Does have home bp cuff. Office bp cuff given: not applicable. Check bp weekly, let us know if consistently >140 and/or >90. ? ?Follow-up: Return in about 4 weeks (around 10/15/2021) for LROB, US:EFW, CNM, in person. ? ?Future Appointments  ?Date Time Provider Department Center  ?09/17/2021 10:00 AM [facility]  ? ? ?Orders Placed This Encounter  ?Procedures  ? Tdap vaccine greater than or equal to 7yo IM  ? ?Cheral Marker CNM, WHNP-BC ?09/17/2021 ?9:47 AM  ?

## 2021-09-18 ENCOUNTER — Other Ambulatory Visit: Payer: Self-pay | Admitting: Women's Health

## 2021-09-18 LAB — CBC
Hematocrit: 29.5 % — ABNORMAL LOW (ref 34.0–46.6)
Hemoglobin: 9.9 g/dL — ABNORMAL LOW (ref 11.1–15.9)
MCH: 28.6 pg (ref 26.6–33.0)
MCHC: 33.6 g/dL (ref 31.5–35.7)
MCV: 85 fL (ref 79–97)
Platelets: 258 10*3/uL (ref 150–450)
RBC: 3.46 x10E6/uL — ABNORMAL LOW (ref 3.77–5.28)
RDW: 12.6 % (ref 11.7–15.4)
WBC: 5.1 10*3/uL (ref 3.4–10.8)

## 2021-09-18 LAB — GLUCOSE TOLERANCE, 2 HOURS W/ 1HR
Glucose, 1 hour: 136 mg/dL (ref 70–179)
Glucose, 2 hour: 115 mg/dL (ref 70–152)
Glucose, Fasting: 74 mg/dL (ref 70–91)

## 2021-09-18 LAB — RPR: RPR Ser Ql: NONREACTIVE

## 2021-09-18 LAB — HIV ANTIBODY (ROUTINE TESTING W REFLEX): HIV Screen 4th Generation wRfx: NONREACTIVE

## 2021-09-18 LAB — ANTIBODY SCREEN: Antibody Screen: NEGATIVE

## 2021-09-18 MED ORDER — FERROUS SULFATE 325 (65 FE) MG PO TABS: 325.0000 mg | ORAL_TABLET | ORAL | 2 refills | Status: DC

## 2021-10-02 ENCOUNTER — Encounter: Payer: Self-pay | Admitting: Women's Health

## 2021-10-09 ENCOUNTER — Encounter: Payer: Self-pay | Admitting: *Deleted

## 2021-10-12 ENCOUNTER — Other Ambulatory Visit: Payer: Self-pay

## 2021-10-12 ENCOUNTER — Encounter (HOSPITAL_COMMUNITY): Payer: Self-pay | Admitting: Obstetrics and Gynecology

## 2021-10-12 ENCOUNTER — Inpatient Hospital Stay (HOSPITAL_COMMUNITY)
Admission: AD | Admit: 2021-10-12 | Discharge: 2021-10-13 | Disposition: A | Discharge status: Home / Self Care | Payer: BC Managed Care – PPO | Attending: Obstetrics and Gynecology | Admitting: Obstetrics and Gynecology

## 2021-10-12 DIAGNOSIS — Z3480 Encounter for supervision of other normal pregnancy, unspecified trimester: Secondary | ICD-10-CM

## 2021-10-12 DIAGNOSIS — B379 Candidiasis, unspecified: Secondary | ICD-10-CM | POA: Diagnosis not present

## 2021-10-12 DIAGNOSIS — Z3A31 31 weeks gestation of pregnancy: Secondary | ICD-10-CM | POA: Diagnosis not present

## 2021-10-12 DIAGNOSIS — O98813 Other maternal infectious and parasitic diseases complicating pregnancy, third trimester: Secondary | ICD-10-CM | POA: Insufficient documentation

## 2021-10-12 DIAGNOSIS — O26893 Other specified pregnancy related conditions, third trimester: Secondary | ICD-10-CM | POA: Insufficient documentation

## 2021-10-12 DIAGNOSIS — O99891 Other specified diseases and conditions complicating pregnancy: Secondary | ICD-10-CM | POA: Insufficient documentation

## 2021-10-12 LAB — URINALYSIS, ROUTINE W REFLEX MICROSCOPIC
Bilirubin Urine: NEGATIVE
Glucose, UA: NEGATIVE mg/dL
Hgb urine dipstick: NEGATIVE
Ketones, ur: NEGATIVE mg/dL
Nitrite: NEGATIVE
Protein, ur: NEGATIVE mg/dL
Specific Gravity, Urine: 1.004 — ABNORMAL LOW (ref 1.005–1.030)
pH: 7 (ref 5.0–8.0)

## 2021-10-12 LAB — WET PREP, GENITAL
Clue Cells Wet Prep HPF POC: NONE SEEN
Sperm: NONE SEEN
Trich, Wet Prep: NONE SEEN
WBC, Wet Prep HPF POC: >=10 — AB (ref <10)

## 2021-10-12 MED ORDER — ACETAMINOPHEN 325 MG PO TABS
650.0000 mg | ORAL_TABLET | Freq: Once | ORAL | Status: AC
  Administered 2021-10-12: 650 mg via ORAL
  Filled 2021-10-12: qty 2

## 2021-10-12 NOTE — MAU Provider Note (Addendum)
Patient Caitlyn Green is a 21 y.o. G2P1001 ? At 19w2dhere with complaints of contractions that started today at 6 pm today (4.5 hours ago). She denies LOF, VB, decreased fetal movements. She denies any complications with this pregnancy; she denies any history of blood sugar, blood pressure complications. She denies any CS in the past.  ? ?She denies fever, SOB, diarrhea, nausea, constipation. She denies dysuria.  ?History  ?  ? ?CSN: [id number]? ?Arrival date and time: 10/12/21 2120 ? ? Event Date/Time  ? First Provider Initiated Contact with Patient 10/12/21 2231   ?  ? ?Chief Complaint  ?Patient presents with  ? Abdominal Pain  ? Back Pain  ? ?Abdominal Pain ?This is a new problem. The current episode started today. The problem occurs intermittently. The problem is unchanged. The pain is located in the RLQ. The pain is at a severity of 7/10. The quality of the pain is described as cramping. The pain does not radiate. Pertinent negatives include no diarrhea, dysuria, fever or nausea.  ?Back Pain ?This is a new problem. The current episode started today. The problem is unchanged. The pain is at a severity of 7/10. Associated symptoms include abdominal pain. Pertinent negatives include no dysuria or fever.  ? ?OB History   ? ? Gravida  ?2  ? Para  ?1  ? Term  ?1  ? Preterm  ?0  ? AB  ?0  ? Living  ?1  ?  ? ? SAB  ?0  ? IAB  ?0  ? Ectopic  ?0  ? Multiple  ?0  ? Live Births  ?1  ?   ?  ?  ? ? ?Past Medical History:  ?Diagnosis Date  ? Back strain   ? [place]   ? ? ?Past Surgical History:  ?Procedure Laterality Date  ? NO PAST SURGERIES    ? ? ?Family History  ?Problem Relation Age of Onset  ? Healthy Mother   ? Cancer Father   ? Arthritis Maternal Grandmother   ? Healthy Sister   ? Healthy Sister   ? Healthy Brother   ? ? ?Social History  ? ?Tobacco Use  ? Smoking status: Never  ? Smokeless tobacco: Never  ?Vaping Use  ? Vaping Use: Former  ?Substance Use Topics  ? Alcohol use: No  ? Drug use: No   ? ? ?Allergies: No Known Allergies ? ?Medications Prior to Admission  ?Medication Sig Dispense Refill Last Dose  ? prenatal vitamin w/FE, FA (PRENATAL 1 + 1) 27-1 MG TABS tablet Take 1 tablet by mouth daily at 12 noon. 30 tablet 12 10/12/2021  ? acetaminophen (TYLENOL) 500 MG tablet Take 500 mg by mouth every 6 (six) hours as needed.     ? CIMZIA STARTER KIT 6 X 200 MG/ML PSKT Inject into the skin.     ? ferrous sulfate 325 (65 FE) MG tablet Take 1 tablet (325 mg total) by mouth every other day. 45 tablet 2   ? metroNIDAZOLE (FLAGYL) 500 MG tablet Take 1 tablet (500 mg total) by mouth 2 (two) times daily. 14 tablet 0   ? promethazine (PHENERGAN) 25 MG tablet Take 1 tablet (25 mg total) by mouth every 6 (six) hours as needed for nausea or vomiting. 30 tablet 1 More than a month  ? sertraline (ZOLOFT) 25 MG tablet Take 1 tablet (25 mg total) by mouth daily. Take half tablet for one week then increase to one tablet daily 30 tablet  2   ? ? ?Review of Systems  ?Constitutional:  Negative for fever.  ?Gastrointestinal:  Positive for abdominal pain. Negative for diarrhea and nausea.  ?Genitourinary:  Negative for dysuria.  ?Musculoskeletal:  Positive for back pain.  ?Physical Exam  ? ?Blood pressure 124/64, pulse 68, temperature 98.8 ?F (37.1 ?C), resp. rate 18, height 5' 11"  (1.803 m), weight 65.3 kg, last menstrual period 03/07/2021, not currently breastfeeding. ? ?Physical Exam ?Pulmonary:  ?   Effort: Pulmonary effort is normal.  ?Abdominal:  ?   General: Abdomen is flat.  ?Genitourinary: ?   Vagina: Normal.  ?Skin: ?   General: Skin is warm.  ?Neurological:  ?   General: No focal deficit present.  ?   Mental Status: She is alert.  ?Psychiatric:     ?   Mood and Affect: Mood normal.  ? ? ?MAU Course  ?Procedures ? ?MDM ?-cervix is posterior, FT, nontender on first assessment ?-palpated abdomen during contraction and it was soft  ?-will PO hydrate, send wet prep and G probe ?Cervix was FT on initial check and then  sligthtly more dilated on recheck. Patient offered IV fluids (she declined); she had one dose of procardia and tylenol and slept while in MAU.  ?-UA normal ?Reassessment (2:29 AM) ?Patient in deep sleep in MAU bed; when awoken she requested to be discharged.  ? ?NST: 120 bpm, mod var, present acel, no decels, no contractions ?Assessment and Plan  ? ?1. Supervision of other normal pregnancy, antepartum   ?2. Yeast infection   ?3. [redacted] weeks gestation of pregnancy   ? ?-patient stable for discharge with RX for Terazole ?-keep appt at 10-15-2021; return precautions given ?-GC CT pending ? ?Caitlyn Green ?10/13/2021, 2:29 AM  ?

## 2021-10-12 NOTE — MAU Note (Signed)
.  Caitlyn Green is a 21 y.o. at [redacted]w[redacted]d here in MAU reporting: around 6pm she started having lower back pain and abd pain. Feels like a ctx that will not stop. Good fetal movement felt. Denies any vag bleeding or discharge.  ? ?Onset of complaint: 6pm ?Pain score: 7/10 ?Vitals:  ? 10/12/21 2202  ?BP: 112/61  ?Pulse: 68  ?Resp: 18  ?Temp: 98.8 ?F (37.1 ?C)  ?   ?FHT:133 ?Lab orders placed from triage:  u/a ? ? ?

## 2021-10-13 DIAGNOSIS — O26893 Other specified pregnancy related conditions, third trimester: Secondary | ICD-10-CM | POA: Diagnosis not present

## 2021-10-13 DIAGNOSIS — B379 Candidiasis, unspecified: Secondary | ICD-10-CM | POA: Diagnosis not present

## 2021-10-13 DIAGNOSIS — Z3A31 31 weeks gestation of pregnancy: Secondary | ICD-10-CM

## 2021-10-13 MED ORDER — TERCONAZOLE 0.4 % VA CREA: 1.0000 | TOPICAL_CREAM | Freq: Every day | VAGINAL | 0 refills | Status: DC

## 2021-10-13 MED ORDER — NIFEDIPINE 10 MG PO CAPS
10.0000 mg | ORAL_CAPSULE | ORAL | Status: DC | PRN
  Administered 2021-10-13: 10 mg via ORAL
  Filled 2021-10-13 (×2): qty 1

## 2021-10-14 ENCOUNTER — Other Ambulatory Visit: Payer: Self-pay | Admitting: Women's Health

## 2021-10-14 DIAGNOSIS — O09299 Supervision of pregnancy with other poor reproductive or obstetric history, unspecified trimester: Secondary | ICD-10-CM

## 2021-10-14 LAB — GC/CHLAMYDIA PROBE AMP (~~LOC~~) NOT AT ARMC
Chlamydia: NEGATIVE
Comment: NEGATIVE
Comment: NORMAL
Neisseria Gonorrhea: NEGATIVE

## 2021-10-15 ENCOUNTER — Ambulatory Visit (INDEPENDENT_AMBULATORY_CARE_PROVIDER_SITE_OTHER): Payer: BC Managed Care – PPO

## 2021-10-15 ENCOUNTER — Ambulatory Visit (INDEPENDENT_AMBULATORY_CARE_PROVIDER_SITE_OTHER): Payer: BC Managed Care – PPO | Admitting: Women's Health

## 2021-10-15 ENCOUNTER — Encounter: Payer: Self-pay | Admitting: Women's Health

## 2021-10-15 VITALS — BP 103/63 | HR 84 | Wt 143.8 lb

## 2021-10-15 DIAGNOSIS — Z3483 Encounter for supervision of other normal pregnancy, third trimester: Secondary | ICD-10-CM

## 2021-10-15 DIAGNOSIS — O09299 Supervision of pregnancy with other poor reproductive or obstetric history, unspecified trimester: Secondary | ICD-10-CM

## 2021-10-15 DIAGNOSIS — Z3480 Encounter for supervision of other normal pregnancy, unspecified trimester: Secondary | ICD-10-CM

## 2021-10-15 MED ORDER — FERROUS SULFATE 325 (65 FE) MG PO TABS: 325.0000 mg | ORAL_TABLET | ORAL | 2 refills | Status: DC

## 2021-10-15 NOTE — Progress Notes (Signed)
? ? ?LOW-RISK PREGNANCY VISIT ?Patient name: Caitlyn Green MRN [id number]  Date of birth: Sep 15, 2000 ?Chief Complaint:   ?Routine Prenatal Visit (Ultrasound/ pharmacy has not record RX ferrous sulfate) ? ?History of Present Illness:   ?Caitlyn Green is a 21 y.o. G42P1001 female at [redacted]w[redacted]d with an Estimated Date of Delivery: 12/12/21 being seen today for ongoing management of a low-risk pregnancy.  ? ?Today she reports  went to MAU 4/8 w/ contractions . Pharmacy said they don't have her rx for Fe, so hasn't started.  Contractions: Not present.  .  Movement: Present. denies leaking of fluid. ? ? ?  09/17/2021  ? 10:22 AM 05/29/2021  ? 11:21 AM 04/30/2020  ?  4:19 PM 03/13/2020  ?  2:49 PM  ?Depression screen PHQ 2/9  ?Decreased Interest 0  ?Down, Depressed, Hopeless ?PHQ - 2 Score ?Altered sleeping 0 ?Tired, decreased energy ?Change in appetite 0 2 1 0  ?Feeling bad or failure about yourself  1 2 0 1  ?Trouble concentrating 0 1 0 0  ?Moving slowly or fidgety/restless 0 0 0 0  ?Suicidal thoughts 0 0 0 0  ?PHQ-9 Score ?Difficult doing work/chores    Not difficult at all  ? ?  ? ?  09/17/2021  ? 10:22 AM 05/29/2021  ? 11:22 AM 04/30/2020  ?  4:19 PM 03/13/2020  ?  2:51 PM  ?GAD 7 : Generalized Anxiety Score  ?Nervous, Anxious, on Edge 0 2 0 1  ?Control/stop worrying 0 2 0 0  ?Worry too much - different things 0 2 0 1  ?Trouble relaxing 0 2 0 0  ?Restless 0 1 0 0  ?Easily annoyed or irritable ?Afraid - awful might happen 0 1 0 0  ?Total GAD 7 Score ?Anxiety Difficulty    Not difficult at all  ? ? ?  ?Review of Systems:   ?Pertinent items are noted in HPI ?Denies abnormal vaginal discharge w/ itching/odor/irritation, headaches, visual changes, shortness of breath, chest pain, abdominal pain, severe nausea/vomiting, or problems with urination or bowel movements unless otherwise stated above. ?Pertinent History Reviewed:  ?Reviewed past medical,surgical, social,  obstetrical and family history.  ?Reviewed problem list, medications and allergies. ?Physical Assessment:  ? ?Vitals:  ? 10/15/21 1418  ?BP: 103/63  ?Pulse: 84  ?Weight: 143 lb 12.8 oz (65.2 kg)  ?Body mass index is 20.06 kg/m?. ?  ?     Physical Examination:  ? General appearance: Well appearing, and in no distress ? Mental status: Alert, oriented to person, place, and time ? Skin: Warm & dry ? Cardiovascular: Normal heart rate noted ? Respiratory: Normal respiratory effort, no distress ? Abdomen: Soft, gravid, nontender ? Pelvic: Cervical exam deferred        ? Extremities: Edema: None ? ?Fetal Status:     Movement: Present  [place] 31+5 wks,cephalic,right lateral placenta gr 3,cx 3.3 cm,AFI 12.5 cm,EFW 1750 g 28% ? ?Chaperone: N/A   ?No results found for this or any previous visit (from the past 24 hour(s)).  ?Assessment & Plan:  ?1) Low-risk pregnancy G2P1001 at [redacted]w[redacted]d with an Estimated Date of Delivery: 12/12/21  ? ?2) H/O FGR prior pregnancy, EFW 28% today ? ?3) Anemia> resent Fe rx to pharmacy, if still doesn't get, can get otc or let me  know and will rx to different pharmacy ?  ?Meds: No orders of the defined types were placed in this encounter. ? ?Labs/procedures today: U/S ? ?Plan:  Continue routine obstetrical care  ?Next visit: prefers in person   ? ?Reviewed: Preterm labor symptoms and general obstetric precautions including but not limited to vaginal bleeding, contractions, leaking of fluid and fetal movement were reviewed in detail with the patient.  All questions were answered. Does have home bp cuff. Office bp cuff given: not applicable. Check bp weekly, let us know if consistently >140 and/or >90. ? ?Follow-up: Return in about 2 weeks (around 10/29/2021) for LROB, CNM, in person. ? ?No future appointments. ? ?No orders of the defined types were placed in this encounter. ? ?Cheral Marker CNM, WHNP-BC ?10/15/2021 ?2:34 PM  ?

## 2021-10-15 NOTE — Patient Instructions (Signed)
Caitlyn Green, thank you for choosing our office today! We appreciate the opportunity to meet your healthcare needs. You may receive a short survey by mail, e-mail, or through MyChart. If you are happy with your care we would appreciate if you could take just a few minutes to complete the survey questions. We read all of your comments and take your feedback very seriously. Thank you again for choosing our office.  ?[facility] ? ?[facility] ?([address] [place], [place] [address]) ?Entrance C, located off of [address] ?Free 24/7 valet parking  ? ?CLASSES: Go to [email or web address] to register for classes (childbirth, breastfeeding, waterbirth, infant CPR, daddy bootcamp, etc.) ? ?Call the office ([phone]) or go to [facility] if: ?You begin to have strong, frequent contractions ?Your water breaks.  Sometimes it is a big gush of fluid, sometimes it is just a trickle that keeps getting your panties wet or running down your legs ?You have vaginal bleeding.  It is normal to have a small amount of spotting if your cervix was checked.  ?You don't feel your baby moving like normal.  If you don't, get you something to eat and drink and lay down and focus on feeling your baby move.   If your baby is still not moving like normal, you should call the office or go to [facility]. ? ?Call the office ([phone]) or go to [facility] for these signs of pre-eclampsia: ?Severe headache that does not go away with Tylenol ?Visual changes- seeing spots, double, blurred vision ?Pain under your right breast or upper abdomen that does not go away with Tums or heartburn medicine ?Nausea and/or vomiting ?Severe swelling in your hands, feet, and face  ? ?Tdap Vaccine ?It is recommended that you get the Tdap vaccine during the third trimester of EACH pregnancy to help protect your baby from getting pertussis (whooping cough) ?27-36 weeks is the BEST time to do  this so that you can pass the protection on to your baby. During pregnancy is better than after pregnancy, but if you are unable to get it during pregnancy it will be offered at the hospital.  ?You can get this vaccine with us, at the health department, your family doctor, or some local pharmacies ?Everyone who will be around your baby should also be up-to-date on their vaccines before the baby comes. Adults (who are not pregnant) only need 1 dose of Tdap during adulthood.  ? ?[place] Pediatricians/Family Doctors ? Pediatrics ([facility]): [address], [phone]           ?[facility]: [address], [phone]                ? Family Medicine ([facility]): [address], [phone] (call to ask if accepting patients) ?[facility]: [address], [place], [phone]   ? ?[place] Pediatricians/Family Doctors ?[facility] ([facility]): [address], [phone] ?[facility]: [address], [phone] ?[facility]: [address], [phone] ? ?[place] [facility]  ?[facility] ([facility]): [phone] ?[facility]: [address], [phone]  ? ?[place] [facility] Doctors ?[facility]: [address], [phone] ? ?[place] [facility]  ?[facility]: [address], [phone] ? ?Home Blood Pressure Monitoring for Patients  ? ?Your provider has recommended that you check your   blood pressure (BP) at least once a week at home. If you do not have a blood pressure cuff at home, one will be provided for you. Contact your provider if you have not received your monitor within 1 week.  ? ?Helpful Tips for Accurate Home Blood Pressure Checks  ?Don't smoke, exercise, or drink caffeine 30 minutes before checking your BP ?Use the restroom before checking your BP (a full bladder can raise your  pressure) ?Relax in a comfortable upright chair ?Feet on the ground ?Left arm resting comfortably on a flat surface at the level of your heart ?Legs uncrossed ?Back supported ?Sit quietly and don't talk ?Place the cuff on your bare arm ?Adjust snuggly, so that only two fingertips can fit between your skin and the top of the cuff ?Check 2 readings separated by at least one minute ?Keep a log of your BP readings ?For a visual, please reference this diagram: http://ccnc.care/bpdiagram ? ?Provider Name: [facility]     Phone: [phone] ? ?Zone 1: ALL CLEAR  ?Continue to monitor your symptoms:  ?BP reading is less than 140 (top number) or less than 90 (bottom number)  ?No right upper stomach pain ?No headaches or seeing spots ?No feeling nauseated or throwing up ?No swelling in face and hands ? ?Zone 2: CAUTION ?Call your doctor's office for any of the following:  ?BP reading is greater than 140 (top number) or greater than 90 (bottom number)  ?Stomach pain under your ribs in the middle or right side ?Headaches or seeing spots ?Feeling nauseated or throwing up ?Swelling in face and hands ? ?Zone 3: EMERGENCY  ?Seek immediate medical care if you have any of the following:  ?BP reading is greater than160 (top number) or greater than 110 (bottom number) ?Severe headaches not improving with Tylenol ?Serious difficulty catching your breath ?Any worsening symptoms from Zone 2  ?Preterm Labor and Birth Information ? ?The normal length of a pregnancy is 39-41 weeks. Preterm labor is when labor starts before 37 completed weeks of pregnancy. ?What are the risk factors for preterm labor? ?Preterm labor is more likely to occur in women who: ?Have certain infections during pregnancy such as a bladder infection, sexually transmitted infection, or infection inside the uterus (chorioamnionitis). ?Have a shorter-than-normal cervix. ?Have gone into preterm labor before. ?Have had surgery on their cervix. ?Are younger than age 72  or older than age 108. ?Are African American. ?Are pregnant with twins or multiple babies (multiple gestation). ?Take street drugs or smoke while pregnant. ?Do not gain enough weight while pregnant. ?Became pregnant shortly after having been pregnant. ?What are the symptoms of preterm labor? ?Symptoms of preterm labor include: ?Cramps similar to those that can happen during a menstrual period. The cramps may happen with diarrhea. ?Pain in the abdomen or lower back. ?Regular uterine contractions that may feel like tightening of the abdomen. ?A feeling of increased pressure in the pelvis. ?Increased watery or bloody mucus discharge from the vagina. ?Water breaking (ruptured amniotic sac). ?Why is it important to recognize signs of preterm labor? ?It is important to recognize signs of preterm labor because babies who are born prematurely may not be fully developed. This can put them at an increased risk for: ?Long-term (chronic) heart and lung problems. ?Difficulty immediately after birth with regulating body systems, including blood sugar, body temperature, heart rate, and breathing rate. ?Bleeding in the brain. ?Cerebral palsy. ?Learning difficulties. ?Death. ?These risks are highest for babies who are born before 11 weeks  of pregnancy. ?How is preterm labor treated? ?Treatment depends on the length of your pregnancy, your condition, and the health of your baby. It may involve: ?Having a stitch (suture) placed in your cervix to prevent your cervix from opening too early (cerclage). ?Taking or being given medicines, such as: ?Hormone medicines. These may be given early in pregnancy to help support the pregnancy. ?Medicine to stop contractions. ?Medicines to help mature the baby?s lungs. These may be prescribed if the risk of delivery is high. ?Medicines to prevent your baby from developing cerebral palsy. ?If the labor happens before 34 weeks of pregnancy, you may need to stay in the hospital. ?What should I do if I  think I am in preterm labor? ?If you think that you are going into preterm labor, call your health care provider right away. ?How can I prevent preterm labor in future pregnancies? ?To increase your chan

## 2021-10-15 NOTE — Progress Notes (Signed)
[place] 31+5 wks,cephalic,right lateral placenta gr 3,cx 3.3 cm,AFI 12.5 cm,EFW 1750 g 28% ?

## 2021-10-16 ENCOUNTER — Encounter: Payer: Self-pay | Admitting: Women's Health

## 2021-10-30 ENCOUNTER — Ambulatory Visit (INDEPENDENT_AMBULATORY_CARE_PROVIDER_SITE_OTHER): Payer: BC Managed Care – PPO | Admitting: Medical

## 2021-10-30 ENCOUNTER — Encounter: Payer: Self-pay | Admitting: Medical

## 2021-10-30 VITALS — BP 113/77 | HR 84 | Wt 149.0 lb

## 2021-10-30 DIAGNOSIS — D509 Iron deficiency anemia, unspecified: Secondary | ICD-10-CM | POA: Insufficient documentation

## 2021-10-30 DIAGNOSIS — O99013 Anemia complicating pregnancy, third trimester: Secondary | ICD-10-CM

## 2021-10-30 DIAGNOSIS — D563 Thalassemia minor: Secondary | ICD-10-CM

## 2021-10-30 DIAGNOSIS — O09899 Supervision of other high risk pregnancies, unspecified trimester: Secondary | ICD-10-CM

## 2021-10-30 DIAGNOSIS — M5430 Sciatica, unspecified side: Secondary | ICD-10-CM

## 2021-10-30 DIAGNOSIS — Z3A33 33 weeks gestation of pregnancy: Secondary | ICD-10-CM

## 2021-10-30 DIAGNOSIS — Z3480 Encounter for supervision of other normal pregnancy, unspecified trimester: Secondary | ICD-10-CM

## 2021-10-30 DIAGNOSIS — M0880 Other juvenile arthritis, unspecified site: Secondary | ICD-10-CM

## 2021-10-30 NOTE — Patient Instructions (Addendum)
Daron OfferLiana Hastings - [facility]  ?[address], [place]  ?[email or web address] ? ?[phone] ? ? ?

## 2021-10-30 NOTE — Progress Notes (Signed)
? ?  PRENATAL VISIT NOTE ? ?Subjective:  ?Caitlyn Green is a 21 y.o. G2P1001 at [redacted]w[redacted]d being seen today for ongoing prenatal care.  She is currently monitored for the following issues for this high-risk pregnancy and has Asthma; JIA (juvenile idiopathic arthritis) ([place]); Encounter for supervision of normal pregnancy, antepartum; Short interval between pregnancies affecting pregnancy, antepartum; Alpha thalassemia silent carrier; Chlamydia infection affecting pregnancy; Abnormal weight gain in pregnancy; and Anemia during pregnancy in third trimester on their problem list. ? ?Patient reports backache.  Contractions: Not present. Vag. Bleeding: None.  Movement: Present. Denies leaking of fluid.  ? ?The following portions of the patient's history were reviewed and updated as appropriate: allergies, current medications, past family history, past medical history, past social history, past surgical history and problem list.  ? ?Objective:  ? ?Vitals:  ? 10/30/21 1350  ?BP: 113/77  ?Pulse: 84  ?Weight: 149 lb (67.6 kg)  ? ? ?Fetal Status: Fetal Heart Rate (bpm): 137 Fundal Height: 31 cm Movement: Present    ? ?General:  Alert, oriented and cooperative. Patient is in no acute distress.  ?Skin: Skin is warm and dry. No rash noted.   ?Cardiovascular: Normal heart rate noted  ?Respiratory: Normal respiratory effort, no problems with respiration noted  ?Abdomen: Soft, gravid, appropriate for gestational age.  Pain/Pressure: Present     ?Pelvic: Cervical exam deferred        ?Extremities: Normal range of motion.  Edema: None  ?Mental Status: Normal mood and affect. Normal behavior. Normal judgment and thought content.  ? ?Assessment and Plan:  ?Pregnancy: G2P1001 at [redacted]w[redacted]d ?1. Supervision of other normal pregnancy, antepartum ?- doing well  ? ?2. Short interval between pregnancies affecting pregnancy, antepartum ?- Last delivery 10/2020 ? ?3. Alpha thalassemia silent carrier ? ?4. JIA (juvenile idiopathic arthritis) ([facility]) ?- EFW  28% at 32 weeks  ?- Repeat growth at 36 weeks  ? ?5. Anemia during pregnancy in third trimester ?- Taking iron PO, some GI upset from this, will recheck CBC at 36 weeks  ? ?6. Sciatic leg pain ?- Ice and abdominal support discussed  ?- Information for pregnancy certified chiropractor given  ? ?7. [redacted] weeks gestation of pregnancy ? ?Preterm labor symptoms and general obstetric precautions including but not limited to vaginal bleeding, contractions, leaking of fluid and fetal movement were reviewed in detail with the patient. ?Please refer to After Visit Summary for other counseling recommendations.  ? ?Return in about 2 weeks (around 11/13/2021) for LOB, In-Person, any provider. ? ?No future appointments. ? ?Kerry Hough, PA-C ? ?

## 2021-11-07 ENCOUNTER — Encounter: Payer: Self-pay | Admitting: Women's Health

## 2021-11-08 ENCOUNTER — Inpatient Hospital Stay (HOSPITAL_COMMUNITY)
Admission: AD | Admit: 2021-11-08 | Discharge: 2021-11-09 | Disposition: A | Discharge status: Home / Self Care | Payer: BC Managed Care – PPO | Attending: Obstetrics & Gynecology | Admitting: Obstetrics & Gynecology

## 2021-11-08 DIAGNOSIS — O26893 Other specified pregnancy related conditions, third trimester: Secondary | ICD-10-CM | POA: Insufficient documentation

## 2021-11-08 DIAGNOSIS — R109 Unspecified abdominal pain: Secondary | ICD-10-CM | POA: Insufficient documentation

## 2021-11-08 DIAGNOSIS — Z3A35 35 weeks gestation of pregnancy: Secondary | ICD-10-CM | POA: Insufficient documentation

## 2021-11-08 NOTE — MAU Provider Note (Signed)
Event Date/Time  ? First Provider Initiated Contact with Patient 11/08/21 2309   ?  ?S: [name] is a 21 y.o. G2P1001 at [redacted]w[redacted]d  who presents to MAU today complaining contractions q irregular. She also passed her mucus plug. She denies vaginal bleeding. She denies LOF. She reports normal fetal movement.   ? ?O: BP 117/62 (BP Location: Right Arm)   Pulse 79   Temp 98 ?F (36.7 ?C) (Oral)   Resp 16   LMP 03/07/2021 (Approximate)   SpO2 100%  ?GENERAL: Well-developed, well-nourished female in no acute distress.  ?HEAD: Normocephalic, atraumatic.  ?CHEST: Normal effort of breathing, regular heart rate ?ABDOMEN: Soft, nontender, gravid ? ?Cervical exam:  1 cm, 50, -3, ballotable  ?Cervix rechecked after 1.5 hours and unchanged.  ? ?Fetal Monitoring: ?Baseline: 125 bpm ?Variability: Moderate  ?Accelerations: 15x15 ?Decelerations: None ?Contractions: UI ? ? ?A: ?SIUP at [redacted]w[redacted]d  ?1. Abdominal pain in pregnancy, third trimester   ?2. [redacted] weeks gestation of pregnancy   ?  ? ? ? ?P: ? ?DC home ?Reassurance regarding mucus plug ?Return precautions ?Push fluids.  ? ? ?Duane Lope, NP ?11/08/2021 12:31 AM  ?

## 2021-11-08 NOTE — MAU Note (Signed)
..  Caitlyn Green is a 21 y.o. at [redacted]w[redacted]d here in MAU reporting: Lower abdominal and lower back pain that is constant. Reports she has been loosing her mucous plug since the third and continues to loose it. Denies vaginal bleeding or leaking of fluid. +FM  ? ?Onset of complaint: yesterday  ?Pain score: 5/10 ?Vitals:  ? 11/08/21 2258  ?BP: 117/62  ?Pulse: 79  ?Resp: 16  ?Temp: 98 ?F (36.7 ?C)  ?SpO2: 100%  ?   ?HWT:UUEKCMK in room 130's ?Lab orders placed from triage: ua ?Patient reports she had urinated in lobby and was not able to get a sample. Will try again.    ? ?

## 2021-11-09 ENCOUNTER — Encounter (HOSPITAL_COMMUNITY): Payer: Self-pay | Admitting: Obstetrics & Gynecology

## 2021-11-09 ENCOUNTER — Other Ambulatory Visit: Payer: Self-pay

## 2021-11-09 ENCOUNTER — Inpatient Hospital Stay (EMERGENCY_DEPARTMENT_HOSPITAL)
Admission: AD | Admit: 2021-11-09 | Discharge: 2021-11-10 | Disposition: A | Discharge status: Home / Self Care | Payer: BC Managed Care – PPO | Source: Home / Self Care | Attending: Obstetrics & Gynecology | Admitting: Obstetrics & Gynecology

## 2021-11-09 DIAGNOSIS — R109 Unspecified abdominal pain: Secondary | ICD-10-CM | POA: Diagnosis not present

## 2021-11-09 DIAGNOSIS — O26893 Other specified pregnancy related conditions, third trimester: Secondary | ICD-10-CM

## 2021-11-09 DIAGNOSIS — O4700 False labor before 37 completed weeks of gestation, unspecified trimester: Secondary | ICD-10-CM

## 2021-11-09 DIAGNOSIS — Z3A35 35 weeks gestation of pregnancy: Secondary | ICD-10-CM

## 2021-11-09 LAB — URINALYSIS, ROUTINE W REFLEX MICROSCOPIC
Bilirubin Urine: NEGATIVE
Bilirubin Urine: NEGATIVE
Glucose, UA: NEGATIVE mg/dL
Glucose, UA: NEGATIVE mg/dL
Hgb urine dipstick: NEGATIVE
Hgb urine dipstick: NEGATIVE
Ketones, ur: NEGATIVE mg/dL
Ketones, ur: NEGATIVE mg/dL
Leukocytes,Ua: NEGATIVE
Nitrite: NEGATIVE
Nitrite: NEGATIVE
Protein, ur: NEGATIVE mg/dL
Protein, ur: NEGATIVE mg/dL
Specific Gravity, Urine: 1.003 — ABNORMAL LOW (ref 1.005–1.030)
Specific Gravity, Urine: 1.004 — ABNORMAL LOW (ref 1.005–1.030)
pH: 7 (ref 5.0–8.0)
pH: 7 (ref 5.0–8.0)

## 2021-11-09 LAB — CBC
HCT: 28.3 % — ABNORMAL LOW (ref 36.0–46.0)
Hemoglobin: 9 g/dL — ABNORMAL LOW (ref 12.0–15.0)
MCH: 27.8 pg (ref 26.0–34.0)
MCHC: 31.8 g/dL (ref 30.0–36.0)
MCV: 87.3 fL (ref 80.0–100.0)
Platelets: 200 10*3/uL (ref 150–400)
RBC: 3.24 MIL/uL — ABNORMAL LOW (ref 3.87–5.11)
RDW: 14.4 % (ref 11.5–15.5)
WBC: 5.5 10*3/uL (ref 4.0–10.5)
nRBC: 0 % (ref 0.0–0.2)

## 2021-11-09 LAB — WET PREP, GENITAL
Clue Cells Wet Prep HPF POC: NONE SEEN
Sperm: NONE SEEN
Trich, Wet Prep: NONE SEEN
WBC, Wet Prep HPF POC: <10 (ref <10)
Yeast Wet Prep HPF POC: NONE SEEN

## 2021-11-09 MED ORDER — CYCLOBENZAPRINE HCL 5 MG PO TABS
10.0000 mg | ORAL_TABLET | Freq: Once | ORAL | Status: AC
Start: 2021-11-09 — End: 2021-11-09
  Administered 2021-11-09: 10 mg via ORAL
  Filled 2021-11-09: qty 2

## 2021-11-09 MED ORDER — LACTATED RINGERS IV BOLUS
1000.0000 mL | Freq: Once | INTRAVENOUS | Status: AC
  Administered 2021-11-09: 1000 mL via INTRAVENOUS

## 2021-11-09 MED ORDER — ACETAMINOPHEN 325 MG PO TABS
650.0000 mg | ORAL_TABLET | Freq: Once | ORAL | Status: AC
  Administered 2021-11-09: 650 mg via ORAL
  Filled 2021-11-09: qty 2

## 2021-11-09 MED ORDER — NIFEDIPINE 10 MG PO CAPS
10.0000 mg | ORAL_CAPSULE | ORAL | Status: AC | PRN
  Administered 2021-11-09 – 2021-11-10 (×3): 10 mg via ORAL
  Filled 2021-11-09 (×3): qty 1

## 2021-11-09 NOTE — MAU Note (Signed)
.  Caitlyn Green is a 21 y.o. at [redacted]w[redacted]d here in MAU reporting: pain in her stomach, lower back and pelvis x 2 days, was seen here last pm. Sve 1cm , discharged home and was told to take tylenol but it isn't helping. Now has a lot of mucous discharge and her underwear have been "wet". Reports positive fetal movement.  ? ?Onset of complaint: 2 days ago ?Pain score: 5/10 ?Vitals:  ? 11/09/21 2204  ?BP: 112/61  ?Pulse: 70  ?Resp: 17  ?Temp: 98.4 ?F (36.9 ?C)  ?SpO2: 100%  ?   ?FHT:142 ?Lab orders placed from triage:urine   ? ?

## 2021-11-09 NOTE — MAU Provider Note (Addendum)
Patient Caitlyn Green is a 21 y.o. G2P1001 ? At [redacted]w[redacted]d here with complaints of abdominal pain, back pain, and new onset pelvic pain. She also reports that her mucous plug has been coming out. She denies decreased fetal movements, she denies fever, chest pain, diarrhea. She reports some nausea and vomiting. She denies vaginal bleeding and vaginal discharge (other than mucous plug).  ? ? ?She reports that she was seen in MAU yesterday and was 1 cm; she was discharged home with labor precautions. She reports that she feels the same as she felt yesterday but the pelvic pain is new. It is suprapubic.  ? ?History  ?  ? ?CSN: [id number] ? ?Arrival date and time: 11/09/21 2149 ? ? Event Date/Time  ? First Provider Initiated Contact with Patient 11/09/21 2244   ?  ? ?Chief Complaint  ?Patient presents with  ? Abdominal Pain  ? Pelvic Pain  ? ?Abdominal Pain ?This is a new problem. The current episode started in the past 7 days. The onset quality is sudden. The problem occurs constantly. The problem has been gradually worsening since onset. The pain is located in the suprapubic region. The pain is at a severity of 8/10.  ?Pelvic Pain ?The patient's primary symptoms include pelvic pain. Associated symptoms include abdominal pain.  ? ?OB History   ? ? Gravida  ?2  ? Para  ?1  ? Term  ?1  ? Preterm  ?0  ? AB  ?0  ? Living  ?1  ?  ? ? SAB  ?0  ? IAB  ?0  ? Ectopic  ?0  ? Multiple  ?0  ? Live Births  ?1  ?   ?  ?  ? ? ?Past Medical History:  ?Diagnosis Date  ? Back strain   ? Rheumatoid arthritis ([facility])   ? ? ?Past Surgical History:  ?Procedure Laterality Date  ? NO PAST SURGERIES    ? ? ?Family History  ?Problem Relation Age of Onset  ? Healthy Mother   ? Cancer Father   ? Arthritis Maternal Grandmother   ? Healthy Sister   ? Healthy Sister   ? Healthy Brother   ? ? ?Social History  ? ?Tobacco Use  ? Smoking status: Never  ? Smokeless tobacco: Never  ?Vaping Use  ? Vaping Use: Former  ?Substance Use Topics  ? Alcohol use: No  ? Drug  use: No  ? ? ?Allergies: No Known Allergies ? ?Medications Prior to Admission  ?Medication Sig Dispense Refill Last Dose  ? acetaminophen (TYLENOL) 500 MG tablet Take 500 mg by mouth every 6 (six) hours as needed.   11/09/2021 at 1500  ? ferrous sulfate 325 (65 FE) MG tablet Take 1 tablet (325 mg total) by mouth every other day. 45 tablet 2 Past Week  ? prenatal vitamin w/FE, FA (PRENATAL 1 + 1) 27-1 MG TABS tablet Take 1 tablet by mouth daily at 12 noon. 30 tablet 12 11/08/2021  ? promethazine (PHENERGAN) 25 MG tablet Take 1 tablet (25 mg total) by mouth every 6 (six) hours as needed for nausea or vomiting. (Patient not taking: Reported on 10/15/2021) 30 tablet 1   ? sertraline (ZOLOFT) 25 MG tablet Take 1 tablet (25 mg total) by mouth daily. Take half tablet for one week then increase to one tablet daily 30 tablet 2   ? ? ?Review of Systems  ?Constitutional: Negative.   ?HENT: Negative.    ?Respiratory: Negative.    ?Cardiovascular: Negative.   ?  Gastrointestinal:  Positive for abdominal pain.  ?Genitourinary:  Positive for pelvic pain.  ?Skin: Negative.   ?Neurological: Negative.   ?Hematological: Negative.   ?Physical Exam  ? ?Blood pressure 112/61, pulse 70, temperature 98.4 ?F (36.9 ?C), temperature source Oral, resp. rate 17, height  (1.803 m), weight 68 kg, last menstrual period 03/07/2021, SpO2 100 %, not currently breastfeeding. ? ?Physical Exam ?Constitutional:   ?   Appearance: She is well-developed.  ?Pulmonary:  ?   Effort: Pulmonary effort is normal.  ?Abdominal:  ?   General: Abdomen is flat.  ?   Tenderness: There is no abdominal tenderness.  ?Skin: ?   General: Skin is warm.  ?Neurological:  ?   General: No focal deficit present.  ?   Mental Status: She is alert.  ? ? ?MAU Course  ?Procedures ? ?MDM ?-reviewed MAU course from yesterday ?-NST 130 bpm, mod var, present acel, no decels, uterine irritability ?-will try LR bolus, procardia for comfort x 3 ?0100: Patient is still 2.5cm, pain is has only  slightly eased off with flexeril and procardia and fluid bolus. She and her grandmother report very concerned about going home. Discussed with Dr. Debroah Loop and we will try another fluid bolus and zofran. Patient is also vomiting.  ?- she slept from 3-4 am. She states that her contractions are 10 min apart. I observed that she is wincing and grabbing her stomach at the same time that the baby is very active and moving; patient states she mostly feels the pain when the baby moves.  ?-wet prep  is negative  ?-she denies VB, LOF ?-she reports that nausea is gone and that the GI cocktail helped her ?Assessment and Plan  ? ?1. Preterm contractions   ?2. [redacted] weeks gestation of pregnancy   ?-Patient has now been in MAU since 9 pm; she has been checked each time and is 2.5/3, posterior, 50 effaced.  ?-patient is stable for discharge after long discussion about how she cannot stay for observation due to lack of acuity and that we cannot induce or augment her labor ?-patient and visitor are accepting, although they admit that they were concerned about going home ?-RX given for Pepcid ?-keep appt on 5/18 and return if condition worsens or changes.  ?Charlesetta Garibaldi Tatem Fesler ?11/09/2021, 10:48 PM  ?

## 2021-11-10 DIAGNOSIS — Z3A35 35 weeks gestation of pregnancy: Secondary | ICD-10-CM

## 2021-11-10 DIAGNOSIS — O4700 False labor before 37 completed weeks of gestation, unspecified trimester: Secondary | ICD-10-CM | POA: Diagnosis not present

## 2021-11-10 LAB — TYPE AND SCREEN
ABO/RH(D): A POS
Antibody Screen: NEGATIVE

## 2021-11-10 LAB — RPR: RPR Ser Ql: NONREACTIVE

## 2021-11-10 MED ORDER — ONDANSETRON 4 MG PO TBDP
8.0000 mg | ORAL_TABLET | Freq: Once | ORAL | Status: AC
  Administered 2021-11-10: 8 mg via ORAL
  Filled 2021-11-10: qty 2

## 2021-11-10 MED ORDER — LIDOCAINE VISCOUS HCL 2 % MT SOLN
15.0000 mL | Freq: Once | OROMUCOSAL | Status: AC
  Administered 2021-11-10: 15 mL via ORAL
  Filled 2021-11-10: qty 15

## 2021-11-10 MED ORDER — FAMOTIDINE 40 MG PO TABS
40.0000 mg | ORAL_TABLET | Freq: Every day | ORAL | 1 refills | Status: DC
Start: 2021-11-10 — End: 2021-11-25

## 2021-11-10 MED ORDER — ACETAMINOPHEN 325 MG PO TABS: 650.0000 mg | ORAL_TABLET | Freq: Once | ORAL | Status: DC

## 2021-11-10 MED ORDER — ALUM & MAG HYDROXIDE-SIMETH 200-200-20 MG/5ML PO SUSP
30.0000 mL | Freq: Once | ORAL | Status: AC
  Administered 2021-11-10: 30 mL via ORAL
  Filled 2021-11-10: qty 30

## 2021-11-10 MED ORDER — CYCLOBENZAPRINE HCL 10 MG PO TABS: 10.0000 mg | ORAL_TABLET | Freq: Three times a day (TID) | ORAL | 0 refills | Status: DC | PRN

## 2021-11-10 MED ORDER — SODIUM CHLORIDE 0.9 % IV SOLN
8.0000 mg | Freq: Once | INTRAVENOUS | Status: DC
  Filled 2021-11-10: qty 4

## 2021-11-10 MED ORDER — LACTATED RINGERS IV BOLUS
1000.0000 mL | Freq: Once | INTRAVENOUS | Status: AC
  Administered 2021-11-10: 1000 mL via INTRAVENOUS

## 2021-11-10 MED ORDER — CYCLOBENZAPRINE HCL 5 MG PO TABS: 10.0000 mg | ORAL_TABLET | Freq: Once | ORAL | Status: DC

## 2021-11-10 NOTE — MAU Note (Signed)
Pt states contractions are "slightly less intense" but continues to rate them at 4 on 0-10 scale. ?

## 2021-11-11 ENCOUNTER — Telehealth: Payer: Self-pay | Admitting: *Deleted

## 2021-11-11 LAB — GC/CHLAMYDIA PROBE AMP (~~LOC~~) NOT AT ARMC
Chlamydia: NEGATIVE
Comment: NEGATIVE
Comment: NORMAL
Neisseria Gonorrhea: NEGATIVE

## 2021-11-11 NOTE — Telephone Encounter (Signed)
Patient states she starting loosing her mucous plug May 3, went to MAU twice for contractions but was sent home. Contractions are irregular but uncomfortable. Denies bleeding or leaking of fluid.  Patient with several questions regarding why she was not kept and when she would be kept despite having contractions. Discussed how she is still preterm and augmentation would not occur unless medically indicated. Also talked about cervical change in regards to labor. Encouraged to rest, push fluids and if contractions increased in frequency and strength, to let us know. During our conversation, patient did not sound uncomfortable or under stress. Agreeable to plan and will call back later today if she feels she needs to be checked.  ?

## 2021-11-12 ENCOUNTER — Encounter (HOSPITAL_COMMUNITY): Payer: Self-pay | Admitting: Obstetrics and Gynecology

## 2021-11-12 ENCOUNTER — Encounter: Payer: Self-pay | Admitting: Women's Health

## 2021-11-12 ENCOUNTER — Ambulatory Visit (INDEPENDENT_AMBULATORY_CARE_PROVIDER_SITE_OTHER): Payer: BC Managed Care – PPO | Admitting: Women's Health

## 2021-11-12 ENCOUNTER — Inpatient Hospital Stay (HOSPITAL_COMMUNITY)
Admission: AD | Admit: 2021-11-12 | Discharge: 2021-11-13 | Disposition: A | Discharge status: Home / Self Care | Payer: BC Managed Care – PPO | Attending: Obstetrics and Gynecology | Admitting: Obstetrics and Gynecology

## 2021-11-12 VITALS — BP 112/69 | HR 80 | Wt 148.8 lb

## 2021-11-12 DIAGNOSIS — Z3A35 35 weeks gestation of pregnancy: Secondary | ICD-10-CM

## 2021-11-12 DIAGNOSIS — Z3689 Encounter for other specified antenatal screening: Secondary | ICD-10-CM

## 2021-11-12 DIAGNOSIS — O4700 False labor before 37 completed weeks of gestation, unspecified trimester: Secondary | ICD-10-CM

## 2021-11-12 DIAGNOSIS — Z3483 Encounter for supervision of other normal pregnancy, third trimester: Secondary | ICD-10-CM | POA: Diagnosis not present

## 2021-11-12 DIAGNOSIS — Z3480 Encounter for supervision of other normal pregnancy, unspecified trimester: Secondary | ICD-10-CM

## 2021-11-12 MED ORDER — NIFEDIPINE 10 MG PO CAPS: 10.0000 mg | ORAL_CAPSULE | Freq: Three times a day (TID) | ORAL | 0 refills | Status: DC | PRN

## 2021-11-12 NOTE — MAU Note (Signed)
.  Chloi Bleile is a 21 y.o. at [redacted]w[redacted]d here in MAU via EMS reporting: severe, stabbing lower abdominal pain that started shortly after eating dinner at 2230. The pain was 10/10 and constant. Pain decreased while en route on EMS. Patient states that she feels contraction pain now, 5/10 and that she is no longer feeling the stabbing pain from earlier. She says that the pain she feels now is similar to the pain she was seen for in MAU three days ago. She states that the ctx pain did get better since leaving MAU, but never fully went away. Denies VB or LOF. Reports good FM. She was seen in OB office today and was 3 cm, also given Rx for Procardia to slow down her ctx.  ? ?Patient arrived on RA with 20g IV access in her right AC.  ? ?Onset of complaint: 2230 on 11/12/21 ?Pain score: 5/10 ?Vitals:  ? 11/12/21 2345  ?BP: 112/87  ?Pulse: 76  ?Resp: 18  ?Temp: 98.1 ?F (36.7 ?C)  ?SpO2: 97%  ?   ?FHT:126 bpm ?Lab orders placed from triage:  UA ? ?

## 2021-11-12 NOTE — Progress Notes (Signed)
? ? ?Work-in LOW-RISK PREGNANCY VISIT ?Patient name: Caitlyn Green MRN [id number]  Date of birth: [birth year] ?Chief Complaint:   ?work in ob ([facility] on May 6, with contractions.checked and 2 cm dilated) ? ?History of Present Illness:   ?Caitlyn Green is a 21 y.o. G68P1001 female at [redacted]w[redacted]d with an Estimated Date of Delivery: 12/12/21 being seen today for ongoing management of a low-risk pregnancy.  ? ?Today she reports  contractions/pressure, went to MAU 5/5 & 5/7 w/ contractions, last SVE was 2.5-3/50 . Feels about the same, not worse. Losing mucous plug past week. Had neg wet prep and gc/ct.  Contractions: Irritability.  .  Movement: Absent. denies leaking of fluid. ? ? ?  09/17/2021  ? 10:22 AM 05/29/2021  ? 11:21 AM 04/30/2020  ?  4:19 PM 03/13/2020  ?  2:49 PM  ?Depression screen PHQ 2/9  ?Decreased Interest 2 3 1  0  ?Down, Depressed, Hopeless 1 2 1 1   ?PHQ - 2 Score 3 5 2 1   ?Altered sleeping 0 1 3 1   ?Tired, decreased energy 1 2 1 1   ?Change in appetite 0 2 1 0  ?Feeling bad or failure about yourself  1 2 0 1  ?Trouble concentrating 0 1 0 0  ?Moving slowly or fidgety/restless 0 0 0 0  ?Suicidal thoughts 0 0 0 0  ?PHQ-9 Score 5 13 7 4   ?Difficult doing work/chores    Not difficult at all  ? ?  ? ?  09/17/2021  ? 10:22 AM 05/29/2021  ? 11:22 AM 04/30/2020  ?  4:19 PM 03/13/2020  ?  2:51 PM  ?GAD 7 : Generalized Anxiety Score  ?Nervous, Anxious, on Edge 0 2 0 1  ?Control/stop worrying 0 2 0 0  ?Worry too much - different things 0 2 0 1  ?Trouble relaxing 0 2 0 0  ?Restless 0 1 0 0  ?Easily annoyed or irritable 2 3 2 1   ?Afraid - awful might happen 0 1 0 0  ?Total GAD 7 Score 2 13 2 3   ?Anxiety Difficulty    Not difficult at all  ? ? ?  ?Review of Systems:   ?Pertinent items are noted in HPI ?Denies abnormal vaginal discharge w/ itching/odor/irritation, headaches, visual changes, shortness of breath, chest pain, abdominal pain, severe nausea/vomiting, or problems with urination or bowel movements unless otherwise  stated above. ?Pertinent History Reviewed:  ?Reviewed past medical,surgical, social, obstetrical and family history.  ?Reviewed problem list, medications and allergies. ?Physical Assessment:  ? ?Vitals:  ? 11/12/21 1532  ?BP: 112/69  ?Pulse: 80  ?Weight: 148 lb 12.8 oz (67.5 kg)  ?Body mass index is 20.75 kg/m?. ?  ?     Physical Examination:  ? General appearance: Well appearing, and in no distress ? Mental status: Alert, oriented to person, place, and time ? Skin: Warm & dry ? Cardiovascular: Normal heart rate noted ? Respiratory: Normal respiratory effort, no distress ? Abdomen: Soft, gravid, nontender ? Pelvic: Cervical exam performed  Dilation: 3 Effacement (%): 70 Station: Ballotable ? Extremities: Edema: Trace ? ?Fetal Status: Fetal Heart Rate (bpm): 135 Fundal Height: 28 cm Movement: Absent Presentation: Vertex ?NST: FHR baseline 135 bpm, Variability: moderate, Accelerations:present, Decelerations:  Absent= Cat 1/reactive ?Toco: mild, q 76min, pt talking through them  ? ?Chaperone: Celene Squibb   ?No results found for this or any previous visit (from the past 24 hour(s)).  ?Assessment & Plan:  ?1) Low-risk pregnancy G2P1001 at [redacted]w[redacted]d with an Estimated Date  of Delivery: 12/12/21  ? ?2) Preterm contractions, cx essentially same as 5/7, slightly more effaced. Rx procardia 10mg  TID prn contractions, if not helping/contractions worsening/LOF/VB/DFM go to [facility] ? ?3) Uterine s<d w/ h/o FGR> last EFW 28% @ 31.5wks, has u/s on 5/19 ?  ?Meds:  ?Meds ordered this encounter  ?Medications  ? NIFEdipine (PROCARDIA) 10 MG capsule  ?  Sig: Take 1 capsule (10 mg total) by mouth 3 (three) times daily as needed (contractions).  ?  Dispense:  20 capsule  ?  Refill:  0  ?  Order Specific Question:   Supervising Provider  ?  Answer:   Tania Ade H [[id number]]  ? ?Labs/procedures today: SVE and NST, GBS ? ?Plan:  Continue routine obstetrical care  ?Next visit: prefers in person   ? ?Reviewed: Preterm labor symptoms and general obstetric  precautions including but not limited to vaginal bleeding, contractions, leaking of fluid and fetal movement were reviewed in detail with the patient.  All questions were answered. Does have home bp cuff. Office bp cuff given: not applicable. Check bp weekly, let us know if consistently >140 and/or >90. ? ?Follow-up: Return for As scheduled. ? ?Future Appointments  ?Date Time Provider [place]  ?11/22/2021 12:30 PM [facility] [place] [facility] None  ?11/22/2021  1:20 PM Janyth Pupa, DO [facility]  ? ? ?Orders Placed This Encounter  ?Procedures  ? Strep Gp B NAA  ? ?[place], WHNP-BC ?11/12/2021 ?5:02 PM  ?

## 2021-11-12 NOTE — Patient Instructions (Signed)
Caitlyn Green, thank you for choosing our office today! We appreciate the opportunity to meet your healthcare needs. You may receive a short survey by mail, e-mail, or through [employer]. If you are happy with your care we would appreciate if you could take just a few minutes to complete the survey questions. We read all of your comments and take your feedback very seriously. Thank you again for choosing our office.  ?Center for [employer] Team at [facility] ? ?[facility] [place] at [facility] ?([address] [place], [place] [address]) ?Entrance C, located off of [address] ?Free 24/7 valet parking  ? ?CLASSES: Go to [email or web address] to register for classes (childbirth, breastfeeding, waterbirth, infant CPR, daddy bootcamp, etc.) ? ?Call the office [phone]) or go to [facility] if: ?You begin to have strong, frequent contractions ?Your water breaks.  Sometimes it is a big gush of fluid, sometimes it is just a trickle that keeps getting your panties wet or running down your legs ?You have vaginal bleeding.  It is normal to have a small amount of spotting if your cervix was checked.  ?You don't feel your baby moving like normal.  If you don't, get you something to eat and drink and lay down and focus on feeling your baby move.   If your baby is still not moving like normal, you should call the office or go to [facility]. ? ?Call the office [phone]) or go to [facility] hospital for these signs of pre-eclampsia: ?Severe headache that does not go away with Tylenol ?Visual changes- seeing spots, double, blurred vision ?Pain under your right breast or upper abdomen that does not go away with Tums or heartburn medicine ?Nausea and/or vomiting ?Severe swelling in your hands, feet, and face  ? ?Tdap Vaccine ?It is recommended that you get the Tdap vaccine during the third trimester of EACH pregnancy to help protect your baby from getting pertussis (whooping cough) ?27-36 weeks is the BEST time to do  this so that you can pass the protection on to your baby. During pregnancy is better than after pregnancy, but if you are unable to get it during pregnancy it will be offered at the hospital.  ?You can get this vaccine with [place], at the health department, your family doctor, or some local pharmacies ?Everyone who will be around your baby should also be up-to-date on their vaccines before the baby comes. Adults (who are not pregnant) only need 1 dose of Tdap during adulthood.  ? ?[place] Pediatricians/Family Doctors ?[facility]): [address] [place], [phone]           ?[facility]: [address], [phone]                ?[place] [facility]): [place], [phone] (call to ask if accepting patients) ?[facility] Department: [address], [place], [place]   ? ?[place] Pediatricians/Family Doctors ?[facility]): [address], [address], [phone] ?[place]: [address] [place], [phone] ?[facility] [place]: [place], [phone] ? ?[place]  ?[facility]): [phone] ?[facility]: [place], [phone]  ? ?[place] ?[place]: [place] [address] [phone] ? ?[place]  ?[facility]: [phone], [phone] ? ?Home Blood Pressure Monitoring for Patients  ? ?Your provider has recommended that you check your  blood pressure (BP) at least once a week at home. If you do not have a blood pressure cuff at home, one will be provided for you. Contact your provider if you have not received your monitor within 1 week.  ? ?Helpful Tips for Accurate Home Blood Pressure Checks  ?Don't smoke, exercise, or drink caffeine 30 minutes before checking your BP ?Use the restroom before checking your BP (a full bladder can raise your  pressure) ?Relax in a comfortable upright chair ?Feet on the ground ?Left arm resting comfortably on a flat surface at the level of your heart ?Legs uncrossed ?Back supported ?Sit quietly and don't talk ?Place the cuff on your bare arm ?Adjust snuggly, so that only two fingertips can fit between your skin and the top of the cuff ?Check 2 readings separated by at least one minute ?Keep a log of your BP readings ?For a visual, please reference this diagram: http://ccnc.care/bpdiagram ? ?Provider Name: [facility]     Phone: [phone] ? ?Zone 1: ALL CLEAR  ?Continue to monitor your symptoms:  ?BP reading is less than 140 (top number) or less than 90 (bottom number)  ?No right upper stomach pain ?No headaches or seeing spots ?No feeling nauseated or throwing up ?No swelling in face and hands ? ?Zone 2: CAUTION ?Call your doctor's office for any of the following:  ?BP reading is greater than 140 (top number) or greater than 90 (bottom number)  ?Stomach pain under your ribs in the middle or right side ?Headaches or seeing spots ?Feeling nauseated or throwing up ?Swelling in face and hands ? ?Zone 3: EMERGENCY  ?Seek immediate medical care if you have any of the following:  ?BP reading is greater than160 (top number) or greater than 110 (bottom number) ?Severe headaches not improving with Tylenol ?Serious difficulty catching your breath ?Any worsening symptoms from Zone 2  ?Preterm Labor and Birth Information ? ?The normal length of a pregnancy is 39-41 weeks. Preterm labor is when labor starts before 37 completed weeks of pregnancy. ?What are the risk factors for preterm labor? ?Preterm labor is more likely to occur in women who: ?Have certain infections during pregnancy such as a bladder infection, sexually transmitted infection, or infection inside the uterus (chorioamnionitis). ?Have a shorter-than-normal cervix. ?Have gone into preterm labor before. ?Have had surgery on their cervix. ?Are younger than age 72  or older than age 108. ?Are African American. ?Are pregnant with twins or multiple babies (multiple gestation). ?Take street drugs or smoke while pregnant. ?Do not gain enough weight while pregnant. ?Became pregnant shortly after having been pregnant. ?What are the symptoms of preterm labor? ?Symptoms of preterm labor include: ?Cramps similar to those that can happen during a menstrual period. The cramps may happen with diarrhea. ?Pain in the abdomen or lower back. ?Regular uterine contractions that may feel like tightening of the abdomen. ?A feeling of increased pressure in the pelvis. ?Increased watery or bloody mucus discharge from the vagina. ?Water breaking (ruptured amniotic sac). ?Why is it important to recognize signs of preterm labor? ?It is important to recognize signs of preterm labor because babies who are born prematurely may not be fully developed. This can put them at an increased risk for: ?Long-term (chronic) heart and lung problems. ?Difficulty immediately after birth with regulating body systems, including blood sugar, body temperature, heart rate, and breathing rate. ?Bleeding in the brain. ?Cerebral palsy. ?Learning difficulties. ?Death. ?These risks are highest for babies who are born before 11 weeks  of pregnancy. ?How is preterm labor treated? ?Treatment depends on the length of your pregnancy, your condition, and the health of your baby. It may involve: ?Having a stitch (suture) placed in your cervix to prevent your cervix from opening too early (cerclage). ?Taking or being given medicines, such as: ?Hormone medicines. These may be given early in pregnancy to help support the pregnancy. ?Medicine to stop contractions. ?Medicines to help mature the baby?s lungs. These may be prescribed if the risk of delivery is high. ?Medicines to prevent your baby from developing cerebral palsy. ?If the labor happens before 34 weeks of pregnancy, you may need to stay in the hospital. ?What should I do if I  think I am in preterm labor? ?If you think that you are going into preterm labor, call your health care provider right away. ?How can I prevent preterm labor in future pregnancies? ?To increase your chan

## 2021-11-13 ENCOUNTER — Encounter: Payer: BC Managed Care – PPO | Admitting: Medical

## 2021-11-13 DIAGNOSIS — O4700 False labor before 37 completed weeks of gestation, unspecified trimester: Secondary | ICD-10-CM | POA: Diagnosis not present

## 2021-11-13 DIAGNOSIS — Z3A35 35 weeks gestation of pregnancy: Secondary | ICD-10-CM | POA: Diagnosis not present

## 2021-11-13 LAB — URINALYSIS, ROUTINE W REFLEX MICROSCOPIC
Bilirubin Urine: NEGATIVE
Glucose, UA: NEGATIVE mg/dL
Hgb urine dipstick: NEGATIVE
Ketones, ur: NEGATIVE mg/dL
Nitrite: NEGATIVE
Protein, ur: NEGATIVE mg/dL
Specific Gravity, Urine: 1.004 — ABNORMAL LOW (ref 1.005–1.030)
pH: 7 (ref 5.0–8.0)

## 2021-11-13 MED ORDER — TERBUTALINE SULFATE 1 MG/ML IJ SOLN
0.2500 mg | Freq: Once | INTRAMUSCULAR | Status: AC
  Administered 2021-11-13: 0.25 mg via SUBCUTANEOUS
  Filled 2021-11-13: qty 1

## 2021-11-13 NOTE — MAU Provider Note (Addendum)
?History  ?  ? ?CSN: [id number] ? ?Arrival date and time: 11/12/21 2340 ? ? Event Date/Time  ? First Provider Initiated Contact with Patient 11/13/21 0015   ?  ? ?Chief Complaint  ?Patient presents with  ? Abdominal Pain  ? ?HPI ?Caitlyn Green is a 21 y.o. G2P1001 at [redacted]w[redacted]d who presents via EMS with contractions. She was seen on 5/5 and 5/7 in MAU with the same complaint. She went to the office today with contractions and was unchanged. She was prescribed procardia for as needed use but has not picked it up yet. She reports the contractions have continued to be strong and regular. She denies any bleeding or leaking. She reports regular painful fetal movement.  ? ?OB History   ? ? Gravida  ?2  ? Para  ?1  ? Term  ?1  ? Preterm  ?0  ? AB  ?0  ? Living  ?1  ?  ? ? SAB  ?0  ? IAB  ?0  ? Ectopic  ?0  ? Multiple  ?0  ? Live Births  ?1  ?   ?  ?  ? ? ?Past Medical History:  ?Diagnosis Date  ? Back strain   ? Rheumatoid arthritis (HCC)   ? ? ?Past Surgical History:  ?Procedure Laterality Date  ? NO PAST SURGERIES    ? ? ?Family History  ?Problem Relation Age of Onset  ? Healthy Mother   ? Cancer Father   ? Arthritis Maternal Grandmother   ? Healthy Sister   ? Healthy Sister   ? Healthy Brother   ? ? ?Social History  ? ?Tobacco Use  ? Smoking status: Never  ? Smokeless tobacco: Never  ?Vaping Use  ? Vaping Use: Former  ?Substance Use Topics  ? Alcohol use: No  ? Drug use: No  ? ? ?Allergies: No Known Allergies ? ?Medications Prior to Admission  ?Medication Sig Dispense Refill Last Dose  ? acetaminophen (TYLENOL) 500 MG tablet Take 500 mg by mouth every 6 (six) hours as needed.   Past Week  ? ferrous sulfate 325 (65 FE) MG tablet Take 1 tablet (325 mg total) by mouth every other day. 45 tablet 2 Past Month  ? NIFEdipine (PROCARDIA) 10 MG capsule Take 1 capsule (10 mg total) by mouth 3 (three) times daily as needed (contractions). 20 capsule 0   ? prenatal vitamin w/FE, FA (PRENATAL 1 + 1) 27-1 MG TABS tablet Take 1 tablet by  mouth daily at 12 noon. 30 tablet 12 11/12/2021  ? cyclobenzaprine (FLEXERIL) 10 MG tablet Take 1 tablet (10 mg total) by mouth 3 (three) times daily as needed for muscle spasms. 30 tablet 0   ? famotidine (PEPCID) 40 MG tablet Take 1 tablet (40 mg total) by mouth daily. 30 tablet 1   ? promethazine (PHENERGAN) 25 MG tablet Take 1 tablet (25 mg total) by mouth every 6 (six) hours as needed for nausea or vomiting. 30 tablet 1 More than a month  ? sertraline (ZOLOFT) 25 MG tablet Take 1 tablet (25 mg total) by mouth daily. Take half tablet for one week then increase to one tablet daily 30 tablet 2   ? ? ?Review of Systems  ?Constitutional: Negative.  Negative for fatigue and fever.  ?HENT: Negative.    ?Respiratory: Negative.  Negative for shortness of breath.   ?Cardiovascular: Negative.  Negative for chest pain.  ?Gastrointestinal:  Positive for abdominal pain. Negative for constipation, diarrhea, nausea and vomiting.  ?  Genitourinary: Negative.  Negative for dysuria, vaginal bleeding and vaginal discharge.  ?Neurological: Negative.  Negative for dizziness and headaches.  ?Physical Exam  ? ?Blood pressure 112/71, pulse 74, temperature 98.1 ?F (36.7 ?C), temperature source Oral, resp. rate 18, height  (1.803 m), weight 67.6 kg, last menstrual period 03/07/2021, SpO2 97 %, not currently breastfeeding. ? ?Physical Exam ?Vitals and nursing note reviewed.  ?Constitutional:   ?   General: She is not in acute distress. ?   Appearance: She is well-developed.  ?HENT:  ?   Head: Normocephalic.  ?Eyes:  ?   Pupils: Pupils are equal, round, and reactive to light.  ?Cardiovascular:  ?   Rate and Rhythm: Normal rate and regular rhythm.  ?   Heart sounds: Normal heart sounds.  ?Pulmonary:  ?   Effort: Pulmonary effort is normal. No respiratory distress.  ?   Breath sounds: Normal breath sounds.  ?Abdominal:  ?   General: Bowel sounds are normal. There is no distension.  ?   Palpations: Abdomen is soft.  ?   Tenderness: There is  no abdominal tenderness.  ?Skin: ?   General: Skin is warm and dry.  ?Neurological:  ?   Mental Status: She is alert and oriented to person, place, and time.  ?Psychiatric:     ?   Mood and Affect: Mood normal.     ?   Behavior: Behavior normal.     ?   Thought Content: Thought content normal.     ?   Judgment: Judgment normal.  ? ?Fetal Tracing: ? ?Baseline: 120 ?Variability: moderate ?Accels: 15x15 ?Decels: none ? ?Toco: irregular with UI ? ?Dilation: 3 ?Effacement (%): 70 ?Station: Ballotable ?Presentation: Vertex ?Exam by:: Cleone Slim, CNM ? ? ?MAU Course  ?Procedures ? ?MDM ?Cervix unchanged from previous exams. Lengthy conversation with patient of normalcy of contractions at this gestation and discussed increase in contractions this pregnancy with close interval pregnancies. Offered IV fluids, procardia or terbutaline for patient comfort. CNM also offered discharge home to take previously prescribed procardia if patient desires. Patient understandably frustrated and desires to speak with family before deciding.  ? ?CNM back at bedside to discuss plan of care. Patient and grandmother at bedside and frustrated with contractions. CNM explained again that contractions at this gestation can be normal but these contractions are not changing her cervix. Patient and grandmother asking if patient is "even able to dilate her cervix." Reassured patient that she just had a baby vaginally a year ago and there is no reason to think that she cannot dilate her cervix. Discussed that these contractions are not active labor contractions and that is why there is no cervical change. CNM reeducated patient on previously provided information that we cannot admit or augment her at this time because she is preterm. Discussed elective IOL at 39 weeks if she desires due to discomfort.  ? ?Patient reports being concerned about the amount of medication she was given at her last MAU visit because "my doctor told me they don't do that  after 34 weeks." Discussed with patient that while we do not try to stop labor after 34 weeks, we will offer medication for comfort, which she was given at her last visit. Patient states she does not want any of those medications again.  ? ?CNM offered IV fluids, procardia or terbutaline for tocolysis for patient's comfort. Discussed that tocolysis will not stop active labor from happening and risks and benefits of each option reviewed at  length. Patient opts for terbutaline. CNM provided teaching on possible side effects of terbutaline and patient verbalized understanding and desires administration. ? ?[facility] Eleana Tocco CNM at 0100 ?Rolm Bookbinderaroline M Neill, CNM ?11/13/21 ? ?Contractions mostly stopped after Terbutaline ?Patient expressed desire to be discharged home ?FHR reactive, category I ? ? ?Assessment and Plan  ?A:  Single IUP at 2060w6d ?      Preterm Uterine contractions ?      Reactive FHR tracing ? ?P:   Discharge home ?       Preterm Labor precautions ?      Has Rx for Procardia ?      Encouraged to return if she develops worsening of symptoms, increase in pain, fever, or other concerning symptoms.  ? ?Aviva SignsWilliams, Honorio Devol L, CNM ? ?

## 2021-11-14 LAB — STREP GP B NAA: Strep Gp B NAA: POSITIVE — AB

## 2021-11-20 ENCOUNTER — Ambulatory Visit (INDEPENDENT_AMBULATORY_CARE_PROVIDER_SITE_OTHER): Payer: BC Managed Care – PPO | Admitting: *Deleted

## 2021-11-20 VITALS — BP 121/70 | HR 81

## 2021-11-20 DIAGNOSIS — O4290 Premature rupture of membranes, unspecified as to length of time between rupture and onset of labor, unspecified weeks of gestation: Secondary | ICD-10-CM | POA: Diagnosis not present

## 2021-11-20 DIAGNOSIS — Z3480 Encounter for supervision of other normal pregnancy, unspecified trimester: Secondary | ICD-10-CM

## 2021-11-20 LAB — POCT FERNING: Ferning, POC: NEGATIVE

## 2021-11-20 NOTE — Progress Notes (Signed)
   NURSE VISIT- Possible rupture of membranes  SUBJECTIVE:  Caitlyn Green is a 21 y.o. G78P1001 female at [redacted]w[redacted]d here to rule out rupture of membranes. States she woke up this morning with her underwear wet. Does not notice leaking when she coughs or has sudden movements.    OBJECTIVE:  LMP 03/07/2021 (Approximate)   Appears well, in no apparent distress  Results for orders placed or performed in visit on 11/20/21 (from the past 24 hour(s))  POCT [place]   Collection Time: 11/20/21  3:46 PM  Result Value Ref Range   [place], POC Negative Negative    ASSESSMENT: Perineum dry, Negative fern, no leaking of fluid noted when coughing.  Dr Caitlyn Green aware  PLAN: Continue to monitor and if continuous leaking noted or develops bleeding or increase in contractions, to go to MAU.  Caitlyn Green  11/20/2021 3:49 PM

## 2021-11-21 ENCOUNTER — Other Ambulatory Visit: Payer: Self-pay | Admitting: Women's Health

## 2021-11-21 DIAGNOSIS — O26843 Uterine size-date discrepancy, third trimester: Secondary | ICD-10-CM

## 2021-11-22 ENCOUNTER — Ambulatory Visit (INDEPENDENT_AMBULATORY_CARE_PROVIDER_SITE_OTHER): Payer: BC Managed Care – PPO

## 2021-11-22 ENCOUNTER — Ambulatory Visit (INDEPENDENT_AMBULATORY_CARE_PROVIDER_SITE_OTHER): Payer: BC Managed Care – PPO | Admitting: Obstetrics & Gynecology

## 2021-11-22 ENCOUNTER — Encounter: Payer: Self-pay | Admitting: Obstetrics & Gynecology

## 2021-11-22 ENCOUNTER — Other Ambulatory Visit: Payer: Self-pay | Admitting: Obstetrics & Gynecology

## 2021-11-22 VITALS — BP 121/77 | HR 71 | Wt 151.4 lb

## 2021-11-22 DIAGNOSIS — Z3490 Encounter for supervision of normal pregnancy, unspecified, unspecified trimester: Secondary | ICD-10-CM

## 2021-11-22 DIAGNOSIS — O26843 Uterine size-date discrepancy, third trimester: Secondary | ICD-10-CM

## 2021-11-22 DIAGNOSIS — O365990 Maternal care for other known or suspected poor fetal growth, unspecified trimester, not applicable or unspecified: Secondary | ICD-10-CM

## 2021-11-22 DIAGNOSIS — Z3A37 37 weeks gestation of pregnancy: Secondary | ICD-10-CM

## 2021-11-22 DIAGNOSIS — Z3480 Encounter for supervision of other normal pregnancy, unspecified trimester: Secondary | ICD-10-CM

## 2021-11-22 NOTE — Progress Notes (Signed)
HIGH-RISK PREGNANCY VISIT Patient name: Caitlyn Green MRN [id number]  Date of birth: [birth year] Chief Complaint:   Routine Prenatal Visit ([place] today)  History of Present Illness:   Caitlyn Green is a 21 y.o. G41P1001 female at [redacted]w[redacted]d with an Estimated Date of Delivery: 12/12/21 being seen today for ongoing management of a high-risk pregnancy complicated by:  RA H/o Fetal growth restriction  Today she reports  pelvic pressure .   Contractions: Irritability. Vag. Bleeding: None.  Movement: Present. denies leaking of fluid.      09/17/2021   10:22 AM 05/29/2021   11:21 AM 04/30/2020    4:19 PM 03/13/2020    2:49 PM  Depression screen PHQ 2/9  Decreased Interest 2 3 1  0  Down, Depressed, Hopeless 1 2 1 1   PHQ - 2 Score 3 5 2 1   Altered sleeping 0 1 3 1   Tired, decreased energy 1 2 1 1   Change in appetite 0 2 1 0  Feeling bad or failure about yourself  1 2 0 1  Trouble concentrating 0 1 0 0  Moving slowly or fidgety/restless 0 0 0 0  Suicidal thoughts 0 0 0 0  PHQ-9 Score 5 13 7 4   Difficult doing work/chores    Not difficult at all     Current Outpatient Medications  Medication Instructions   acetaminophen (TYLENOL) 500 mg, Oral, Every 6 hours PRN   cyclobenzaprine (FLEXERIL) 10 mg, Oral, 3 times daily PRN   famotidine (PEPCID) 40 mg, Oral, Daily   ferrous sulfate 325 mg, Oral, Every other day   NIFEdipine (PROCARDIA) 10 mg, Oral, 3 times daily PRN   prenatal vitamin w/FE, FA (PRENATAL 1 + 1) 27-1 MG TABS tablet 1 tablet, Oral, Daily   promethazine (PHENERGAN) 25 mg, Oral, Every 6 hours PRN   sertraline (ZOLOFT) 25 mg, Oral, Daily, Take half tablet for one week then increase to one tablet daily     Review of Systems:   Pertinent items are noted in HPI Denies abnormal vaginal discharge w/ itching/odor/irritation, headaches, visual changes, shortness of breath, chest pain, abdominal pain, severe nausea/vomiting, or problems with urination or bowel movements unless otherwise  stated above. Pertinent History Reviewed:  Reviewed past medical,surgical, social, obstetrical and family history.  Reviewed problem list, medications and allergies. Physical Assessment:   Vitals:   11/22/21 1330  BP: 121/77  Pulse: 71  Weight: 151 lb 6.4 oz (68.7 kg)  Body mass index is 21.12 kg/m.           Physical Examination:   General appearance: alert, well appearing, and in no distress  Mental status: normal mood, behavior, speech, dress, motor activity, and thought processes  Skin: warm & dry   Extremities: Edema: Trace    Cardiovascular: normal heart rate noted  Respiratory: normal respiratory effort, no distress  Abdomen: gravid, soft, non-tender  Pelvic: Cervical exam deferred         Fetal Status:     Movement: Present    Fetal Surveillance Testing today: [place] [id number] wks,cephalic,BPP [id number] placenta gr 3,AFI 15 cm,FHR 152 bpm,RI .66,.56,.72,.66=85%,EFW 2562 g 11%,AC 7.2%   Chaperone: N/A    No results found for this or any previous visit (from the past 24 hour(s)).   Assessment & Plan:  High-risk pregnancy: G2P1001 at [redacted]w[redacted]d with an Estimated Date of Delivery: 12/12/21   1) Fetal growth restriction Reviewed US findings, discussed IOL @ 38-39wk Plan to set up antepartum testing for next week  2) RA -not taking  any medication  3) GBS positive- plan for antibiotics in labor  Meds: No orders of the defined types were placed in this encounter.   Labs/procedures today: growth scan  Treatment Plan:  as outlined aboe  Reviewed: Term labor symptoms and general obstetric precautions including but not limited to vaginal bleeding, contractions, leaking of fluid and fetal movement were reviewed in detail with the patient.  All questions were answered. Pt has home bp cuff. Check bp weekly, let us know if >140/90.   Follow-up: No follow-ups on file.   No future appointments.  No orders of the defined types were placed in this encounter.   Caitlyn Pupa,  DO Attending Anna, [facility] for [employer], [place]

## 2021-11-22 NOTE — Progress Notes (Signed)
US 37+1 wks,cephalic,BPP 8/8,anterior placenta gr 3,AFI 15 cm,FHR 152 bpm,RI .66,.56,.72,.66=85%,EFW 2562 g 11%,AC 7.2%

## 2021-11-24 ENCOUNTER — Inpatient Hospital Stay (EMERGENCY_DEPARTMENT_HOSPITAL)
Admission: AD | Admit: 2021-11-24 | Discharge: 2021-11-25 | Disposition: A | Discharge status: Home / Self Care | Payer: BC Managed Care – PPO | Source: Home / Self Care | Attending: Obstetrics and Gynecology | Admitting: Obstetrics and Gynecology

## 2021-11-24 ENCOUNTER — Encounter (HOSPITAL_COMMUNITY): Payer: Self-pay | Admitting: Obstetrics and Gynecology

## 2021-11-24 ENCOUNTER — Other Ambulatory Visit: Payer: Self-pay

## 2021-11-24 DIAGNOSIS — O26893 Other specified pregnancy related conditions, third trimester: Secondary | ICD-10-CM | POA: Insufficient documentation

## 2021-11-24 DIAGNOSIS — R253 Fasciculation: Secondary | ICD-10-CM | POA: Insufficient documentation

## 2021-11-24 DIAGNOSIS — Z3689 Encounter for other specified antenatal screening: Secondary | ICD-10-CM

## 2021-11-24 DIAGNOSIS — Z3A37 37 weeks gestation of pregnancy: Secondary | ICD-10-CM

## 2021-11-24 DIAGNOSIS — Z3493 Encounter for supervision of normal pregnancy, unspecified, third trimester: Secondary | ICD-10-CM

## 2021-11-24 DIAGNOSIS — Z711 Person with feared health complaint in whom no diagnosis is made: Secondary | ICD-10-CM

## 2021-11-24 NOTE — MAU Note (Signed)
.  Caitlyn Green is a 21 y.o. at [redacted]w[redacted]d here in MAU reporting: Increased fetal movement. Pt states it feels like baby for a "shaking" or "twitching" since yesterday. Pt states she feels her normal movement nonstop since 0200 and felt the other movements like 5 times today. Pt reports having sharp mid ABD pain on the left side of her umbilicus. Pt denies VB, LOF, abnormal discharge, PIH s/s, complication in the pregnancy, and recent intercourse.   Onset of complaint: yesterday Pain score: 4/10 Vitals:   11/24/21 2320  BP: 117/71  Pulse: 86  Resp: 18  Temp: 98.5 F (36.9 C)  SpO2: 98%     FHT:140 Lab orders placed from triage:

## 2021-11-25 ENCOUNTER — Telehealth (HOSPITAL_COMMUNITY): Payer: Self-pay | Admitting: *Deleted

## 2021-11-25 ENCOUNTER — Encounter (HOSPITAL_COMMUNITY): Payer: Self-pay

## 2021-11-25 DIAGNOSIS — D563 Thalassemia minor: Secondary | ICD-10-CM | POA: Diagnosis not present

## 2021-11-25 DIAGNOSIS — Z3A38 38 weeks gestation of pregnancy: Secondary | ICD-10-CM | POA: Diagnosis not present

## 2021-11-25 DIAGNOSIS — O471 False labor at or after 37 completed weeks of gestation: Secondary | ICD-10-CM | POA: Diagnosis not present

## 2021-11-25 DIAGNOSIS — O365930 Maternal care for other known or suspected poor fetal growth, third trimester, not applicable or unspecified: Secondary | ICD-10-CM | POA: Diagnosis not present

## 2021-11-25 DIAGNOSIS — O26893 Other specified pregnancy related conditions, third trimester: Secondary | ICD-10-CM | POA: Diagnosis not present

## 2021-11-25 DIAGNOSIS — Z711 Person with feared health complaint in whom no diagnosis is made: Secondary | ICD-10-CM | POA: Diagnosis not present

## 2021-11-25 DIAGNOSIS — Z3A37 37 weeks gestation of pregnancy: Secondary | ICD-10-CM | POA: Diagnosis not present

## 2021-11-25 DIAGNOSIS — D509 Iron deficiency anemia, unspecified: Secondary | ICD-10-CM | POA: Diagnosis not present

## 2021-11-25 DIAGNOSIS — O99824 Streptococcus B carrier state complicating childbirth: Secondary | ICD-10-CM | POA: Diagnosis not present

## 2021-11-25 DIAGNOSIS — J45909 Unspecified asthma, uncomplicated: Secondary | ICD-10-CM | POA: Diagnosis not present

## 2021-11-25 DIAGNOSIS — O9902 Anemia complicating childbirth: Secondary | ICD-10-CM | POA: Diagnosis not present

## 2021-11-25 DIAGNOSIS — O9952 Diseases of the respiratory system complicating childbirth: Secondary | ICD-10-CM | POA: Diagnosis not present

## 2021-11-25 NOTE — Telephone Encounter (Signed)
Preadmission screen  

## 2021-11-25 NOTE — MAU Provider Note (Signed)
History     CSN: [id number]  Arrival date and time: 11/24/21 2302   Event Date/Time   First Provider Initiated Contact with Patient 11/25/21 0012      Chief Complaint  Patient presents with   increased fetal movement   Caitlyn Green is a 21 y.o. G2P1001 at [redacted]w[redacted]d who receives care at [facility].  She presents today for increased fetal movement.  She states that since yesterday she has been feeling "fast twitching" from the baby that last about 1 minute and occurs every few hours.  She endorses that fetal movement is good and has some small cramping and contractions, but does not feel like it is causing cervical change.  She denies vaginal concerns including bleeding, leaking, and discharge.  She has had no recent illness.   OB History     Gravida  2   Para  1   Term  1   Preterm  0   AB  0   Living  1      SAB  0   IAB  0   Ectopic  0   Multiple  0   Live Births  1           Past Medical History:  Diagnosis Date   Back strain    Rheumatoid arthritis (HCC)     Past Surgical History:  Procedure Laterality Date   NO PAST SURGERIES      Family History  Problem Relation Age of Onset   Healthy Mother    Cancer Father    Arthritis Maternal Grandmother    Healthy Sister    Healthy Sister    Healthy Brother     Social History   Tobacco Use   Smoking status: Never   Smokeless tobacco: Never  Vaping Use   Vaping Use: Former  Substance Use Topics   Alcohol use: No   Drug use: No    Allergies: No Known Allergies  Medications Prior to Admission  Medication Sig Dispense Refill Last Dose   ferrous sulfate 325 (65 FE) MG tablet Take 1 tablet (325 mg total) by mouth every other day. 45 tablet 2 11/24/2021   NIFEdipine (PROCARDIA) 10 MG capsule Take 1 capsule (10 mg total) by mouth 3 (three) times daily as needed (contractions). 20 capsule 0 11/24/2021   prenatal vitamin w/FE, FA (PRENATAL 1 + 1) 27-1 MG TABS tablet Take 1 tablet by mouth daily at 12  noon. 30 tablet 12 11/24/2021   acetaminophen (TYLENOL) 500 MG tablet Take 500 mg by mouth every 6 (six) hours as needed.      cyclobenzaprine (FLEXERIL) 10 MG tablet Take 1 tablet (10 mg total) by mouth 3 (three) times daily as needed for muscle spasms. (Patient not taking: Reported on 11/22/2021) 30 tablet 0    famotidine (PEPCID) 40 MG tablet Take 1 tablet (40 mg total) by mouth daily. (Patient not taking: Reported on 11/22/2021) 30 tablet 1    promethazine (PHENERGAN) 25 MG tablet Take 1 tablet (25 mg total) by mouth every 6 (six) hours as needed for nausea or vomiting. 30 tablet 1    sertraline (ZOLOFT) 25 MG tablet Take 1 tablet (25 mg total) by mouth daily. Take half tablet for one week then increase to one tablet daily 30 tablet 2     Review of Systems  Constitutional:  Negative for chills and fever.  Eyes:  Negative for visual disturbance.  Gastrointestinal:  Negative for abdominal pain, nausea and vomiting.  Genitourinary:  Negative for difficulty urinating, dysuria, vaginal bleeding and vaginal discharge.  Neurological:  Negative for dizziness, light-headedness and headaches.  Physical Exam   Blood pressure 111/67, pulse 76, temperature 98.5 F (36.9 C), temperature source Oral, resp. rate 18, height 5\' 11"  (1.803 m), weight 69.5 kg, last menstrual period 03/07/2021, SpO2 100 %, not currently breastfeeding.  Physical Exam Vitals reviewed.  Constitutional:      General: She is not in acute distress.    Appearance: Normal appearance. She is not ill-appearing.  HENT:     Head: Normocephalic and atraumatic.  Eyes:     Conjunctiva/sclera: Conjunctivae normal.  Cardiovascular:     Rate and Rhythm: Normal rate.  Pulmonary:     Effort: Pulmonary effort is normal. No respiratory distress.  Abdominal:     Comments: Gravid, appears AGA  Musculoskeletal:        General: Normal range of motion.     Cervical back: Normal range of motion.  Neurological:     Mental Status: She is alert  and oriented to person, place, and time.  Psychiatric:        Mood and Affect: Mood normal.        Behavior: Behavior normal.    Fetal Assessment 120 bpm, Mod Var, -Decels, +Accels Toco: Occasional   MAU Course  No results found for this or any previous visit (from the past 24 hour(s)). No results found.  MDM PE Labs: None EFM  Assessment and Plan  21 year old G2P1001  SIUP at 37.4 weeks Cat I FT Increased Fetal Movement   -Reviewed concerns. -Informed that provider cannot definitively say what is causing reported movements. -Reassured that NST is reactive today. -Further reassured that BPP on Friday was consistent with positive fetal wellbeing. -Encouraged to monitor movements and continue to report report anything of concern. -Patient to follow-up as scheduled and reports IOL on Thursday. -No further questions or concerns. -Encouraged to call primary office or return to MAU if symptoms worsen or with the onset of new symptoms. -Discharged to home in stable condition.   Cherre RobinsJessica L Kingstyn Deruiter MSN, CNM 11/25/2021, 12:12 AM

## 2021-11-26 ENCOUNTER — Inpatient Hospital Stay (EMERGENCY_DEPARTMENT_HOSPITAL)
Admission: AD | Admit: 2021-11-26 | Discharge: 2021-11-26 | Disposition: A | Discharge status: Home / Self Care | Payer: BC Managed Care – PPO | Source: Home / Self Care | Attending: Obstetrics and Gynecology | Admitting: Obstetrics and Gynecology

## 2021-11-26 ENCOUNTER — Encounter: Payer: Self-pay | Admitting: Women's Health

## 2021-11-26 ENCOUNTER — Other Ambulatory Visit: Payer: Self-pay

## 2021-11-26 ENCOUNTER — Other Ambulatory Visit: Payer: Self-pay | Admitting: Women's Health

## 2021-11-26 ENCOUNTER — Encounter (HOSPITAL_COMMUNITY): Payer: Self-pay | Admitting: Obstetrics and Gynecology

## 2021-11-26 ENCOUNTER — Encounter (HOSPITAL_COMMUNITY): Payer: Self-pay | Admitting: *Deleted

## 2021-11-26 DIAGNOSIS — O479 False labor, unspecified: Secondary | ICD-10-CM

## 2021-11-26 DIAGNOSIS — O471 False labor at or after 37 completed weeks of gestation: Secondary | ICD-10-CM

## 2021-11-26 DIAGNOSIS — Z3480 Encounter for supervision of other normal pregnancy, unspecified trimester: Secondary | ICD-10-CM

## 2021-11-26 DIAGNOSIS — R109 Unspecified abdominal pain: Secondary | ICD-10-CM | POA: Insufficient documentation

## 2021-11-26 DIAGNOSIS — O26893 Other specified pregnancy related conditions, third trimester: Secondary | ICD-10-CM | POA: Insufficient documentation

## 2021-11-26 DIAGNOSIS — Z3A37 37 weeks gestation of pregnancy: Secondary | ICD-10-CM | POA: Insufficient documentation

## 2021-11-26 NOTE — Progress Notes (Signed)
Monitors applied

## 2021-11-26 NOTE — MAU Note (Signed)
...  Caitlyn Green is a 21 y.o. at [redacted]w[redacted]d here in MAU reporting: CTX since 0330 this morning that have been irregular. She reports they are every 10 minutes now. She reports last night she had a gush of fluids while sitting down around 2230 but has not had any gushes or trickles of fluid since. Denies VB or LOF. +FM.   Scheduled induction this coming Thursday AM. Last took one dose of procardia at 1445.   Pain score:  4/10 lower abdomen 4/10 lower back  FHT: 146 initial external Lab orders placed from triage: MAU Labor

## 2021-11-26 NOTE — MAU Provider Note (Signed)
History     CSN: [id number]  Arrival date and time: 11/26/21 1641   Event Date/Time   First Provider Initiated Contact with Patient 11/26/21 1759      Chief Complaint  Patient presents with   Labor Eval   Caitlyn Green 21 y.o. G2P1001 at [redacted]w[redacted]d presents to MAU with complaints of "gush of fluid" last night at 2221. Pt denies further leaking of fluid since. Pt states the fluid was clear without odor. She endorses contractions at that time "about 3-5 minutes apart." She states contractions were relieved with walking and eating. Pt states that she began to have contractions again this afternoon around 1430 that were "7-10 mins apart." Attempted to relieve with a nap but was unsuccessful. Pt rates current contractions 4/10 on a pain scale. She endorses +FM and "period like cramping"  but denies vaginal bleeding or another episode of leaking of fluid. She also denies headache, blurred vision, epigastric pain. RN collected ferm. Results pending.      OB History     Gravida  2   Para  1   Term  1   Preterm  0   AB  0   Living  1      SAB  0   IAB  0   Ectopic  0   Multiple  0   Live Births  1           Past Medical History:  Diagnosis Date   Back strain    Rheumatoid arthritis (HCC)     Past Surgical History:  Procedure Laterality Date   NO PAST SURGERIES      Family History  Problem Relation Age of Onset   Healthy Mother    Cancer Father    Arthritis Maternal Grandmother    Healthy Sister    Healthy Sister    Healthy Brother     Social History   Tobacco Use   Smoking status: Never   Smokeless tobacco: Never  Vaping Use   Vaping Use: Former  Substance Use Topics   Alcohol use: No   Drug use: No    Allergies: No Known Allergies  Medications Prior to Admission  Medication Sig Dispense Refill Last Dose   acetaminophen (TYLENOL) 500 MG tablet Take 500 mg by mouth every 6 (six) hours as needed.   Past Week   ferrous sulfate 325 (65 FE) MG  tablet Take 1 tablet (325 mg total) by mouth every other day. 45 tablet 2 11/26/2021   NIFEdipine (PROCARDIA) 10 MG capsule Take 1 capsule (10 mg total) by mouth 3 (three) times daily as needed (contractions). 20 capsule 0 11/26/2021 at 1445   prenatal vitamin w/FE, FA (PRENATAL 1 + 1) 27-1 MG TABS tablet Take 1 tablet by mouth daily at 12 noon. 30 tablet 12 11/26/2021   promethazine (PHENERGAN) 25 MG tablet Take 1 tablet (25 mg total) by mouth every 6 (six) hours as needed for nausea or vomiting. 30 tablet 1 Past Week   sertraline (ZOLOFT) 25 MG tablet Take 1 tablet (25 mg total) by mouth daily. Take half tablet for one week then increase to one tablet daily 30 tablet 2     Review of Systems  Constitutional:  Negative for chills and fatigue.  Respiratory:  Negative for apnea and shortness of breath.   Cardiovascular:  Negative for chest pain.  Gastrointestinal:  Positive for abdominal pain. Negative for nausea and vomiting.  Genitourinary:  Negative for vaginal bleeding, vaginal discharge and vaginal  pain.  Neurological:  Negative for headaches.  Physical Exam   Blood pressure 111/65, pulse 91, temperature 98.7 F (37.1 C), temperature source Oral, resp. rate 17, height  (1.803 m), weight 71.3 kg, last menstrual period 03/07/2021, SpO2 100 %, not currently breastfeeding.  Physical Exam Vitals and nursing note reviewed. Exam conducted with a chaperone present.  Constitutional:      General: She is not in acute distress.    Appearance: Normal appearance.  HENT:     Head: Normocephalic.  Cardiovascular:     Rate and Rhythm: Normal rate.  Pulmonary:     Effort: Pulmonary effort is normal. No respiratory distress.  Abdominal:     Tenderness: There is no abdominal tenderness. There is no guarding.     Comments: Pregnant   Genitourinary:    General: Normal vulva.  Musculoskeletal:        General: Normal range of motion.     Cervical back: Normal range of motion.  Skin:    General:  Skin is warm and dry.     Capillary Refill: Capillary refill takes less than 2 seconds.  Neurological:     Mental Status: She is alert and oriented to person, place, and time.  Psychiatric:        Mood and Affect: Mood normal.  Speculum exam performed. No pooling noted on exam. Cervix pink. Normal vaginal discharge noted.  2nd fern collected.  Dilation: 3.5 Effacement (%): 70 Cervical Position: Posterior Station: -1 Presentation: Vertex Exam by:: Sandra Cockayne, CNM.   FHT: 130 moderated variability with accels. No decels noted. Category I tracing  MAU Course  Procedures Lab Orders         POCT fern test     MDM Negative ferm x2. Cervical exam unchanged from office exam.  Contractions irregular.  Low suspicion for Rupture of membranes or spontaneous onset of labor.   Assessment and Plan  False labor at 37 weeks 5 days. 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 Also discussed curb walking for optimal fetal positioning.  - Return labor precautions reviewed. - Pt discharged home in stable condition. FHT: Cat I   - Pt has IOL scheduled for this Thursday.   Claudette Head, CNM  11/26/2021, 5:59 PM

## 2021-11-27 ENCOUNTER — Telehealth: Payer: Self-pay | Admitting: *Deleted

## 2021-11-27 ENCOUNTER — Ambulatory Visit (INDEPENDENT_AMBULATORY_CARE_PROVIDER_SITE_OTHER): Payer: BC Managed Care – PPO | Admitting: Advanced Practice Midwife

## 2021-11-27 VITALS — BP 123/75 | HR 84 | Wt 152.0 lb

## 2021-11-27 DIAGNOSIS — O365990 Maternal care for other known or suspected poor fetal growth, unspecified trimester, not applicable or unspecified: Secondary | ICD-10-CM

## 2021-11-27 DIAGNOSIS — O0993 Supervision of high risk pregnancy, unspecified, third trimester: Secondary | ICD-10-CM

## 2021-11-27 DIAGNOSIS — Z3A37 37 weeks gestation of pregnancy: Secondary | ICD-10-CM

## 2021-11-27 NOTE — Telephone Encounter (Signed)
Patient and her mother called to inquire why when we checked her today she was 1.5 cm and yesterday they told her she was 3 or 4 cm. Advised that I wasn't sure with the discrepancy. Pt reported that when she was checked yesterday the nurse had trouble reaching her cervix, I advised that this may have been why. Pt is scheduled for induction tomorrow. I recommended that if anything changes between now and tomorrow such as regular contractions, water breaks, bleeding like a period or baby isn't moving that she should go to MAU. Pt agreeable no other questions at this time.

## 2021-11-27 NOTE — Progress Notes (Signed)
HIGH-RISK PREGNANCY VISIT Patient name: Caitlyn Green MRN [id number]  Date of birth: Jul 19, 2000 Chief Complaint:   Routine Prenatal Visit  History of Present Illness:   Caitlyn Green is a 21 y.o. G20P1001 female at [redacted]w[redacted]d with an Estimated Date of Delivery: 12/12/21 being seen today for ongoing management of a high-risk pregnancy complicated by fetal growth restriction 11%, AC 7%.    Today she reports  having some mucous d/c and ctx approx q 75mins . Contractions: Irritability. Vag. Bleeding: None.  Movement: Present. denies leaking of fluid.      09/17/2021   10:22 AM 05/29/2021   11:21 AM 04/30/2020    4:19 PM 03/13/2020    2:49 PM  Depression screen PHQ 2/9  Decreased Interest 2 3 1  0  Down, Depressed, Hopeless 1 2 1 1   PHQ - 2 Score 3 5 2 1   Altered sleeping 0 1 3 1   Tired, decreased energy 1 2 1 1   Change in appetite 0 2 1 0  Feeling bad or failure about yourself  1 2 0 1  Trouble concentrating 0 1 0 0  Moving slowly or fidgety/restless 0 0 0 0  Suicidal thoughts 0 0 0 0  PHQ-9 Score 5 13 7 4   Difficult doing work/chores    Not difficult at all        09/17/2021   10:22 AM 05/29/2021   11:22 AM 04/30/2020    4:19 PM 03/13/2020    2:51 PM  GAD 7 : Generalized Anxiety Score  Nervous, Anxious, on Edge 0 2 0 1  Control/stop worrying 0 2 0 0  Worry too much - different things 0 2 0 1  Trouble relaxing 0 2 0 0  Restless 0 1 0 0  Easily annoyed or irritable 2 3 2 1   Afraid - awful might happen 0 1 0 0  Total GAD 7 Score 2 13 2 3   Anxiety Difficulty    Not difficult at all     Review of Systems:   Pertinent items are noted in HPI Denies abnormal vaginal discharge w/ itching/odor/irritation, headaches, visual changes, shortness of breath, chest pain, abdominal pain, severe nausea/vomiting, or problems with urination or bowel movements unless otherwise stated above. Pertinent History Reviewed:  Reviewed past medical,surgical, social, obstetrical and family history.   Reviewed problem list, medications and allergies. Physical Assessment:   Vitals:   11/27/21 0907  BP: 123/75  Pulse: 84  Weight: 152 lb (68.9 kg)  Body mass index is 21.2 kg/m.           Physical Examination:   General appearance: alert, well appearing, and in no distress  Mental status: alert, oriented to person, place, and time  Skin: warm & dry   Extremities: Edema: Trace    Cardiovascular: normal heart rate noted  Respiratory: normal respiratory effort, no distress  Abdomen: gravid, soft, non-tender  Pelvic: Cervical exam performed  Dilation: 1.5 Effacement (%): 50 Station: -2  Fetal Status: Fetal Heart Rate (bpm): 145   Movement: Present Presentation: Vertex  Fetal Surveillance Testing today: doppler    No results found for this or any previous visit (from the past 24 hour(s)).  Assessment & Plan:  High-risk pregnancy: G2P1001 at [redacted]w[redacted]d with an Estimated Date of Delivery: 12/12/21   1) FGR, stable; for IOL tomorrow (orders put in)  2) RA, not taking any meds  Meds: No orders of the defined types were placed in this encounter.   Labs/procedures today: SVE  Treatment Plan:  for IOL tomorrow; plan for PP visit in 6wks  Reviewed: Term labor symptoms and general obstetric precautions including but not limited to vaginal bleeding, contractions, leaking of fluid and fetal movement were reviewed in detail with the patient.  All questions were answered. Does have home bp cuff. Office bp cuff given: not applicable. Check bp daily, let us know if consistently >140 and/or >90.  Follow-up: Return in about 6 weeks (around 01/08/2022) for in person, postpartum visit.   Future Appointments  Date Time Provider [place]  11/28/2021  6:30 AM [facility] None    No orders of the defined types were placed in this encounter.  Myrtis Ser CNM 11/27/2021 9:32 AM

## 2021-11-28 ENCOUNTER — Inpatient Hospital Stay (HOSPITAL_COMMUNITY): Payer: BC Managed Care – PPO | Admitting: Anesthesiology

## 2021-11-28 ENCOUNTER — Inpatient Hospital Stay (HOSPITAL_COMMUNITY)
Admission: AD | Admit: 2021-11-28 | Discharge: 2021-11-29 | DRG: 807 | Disposition: A | Discharge status: Home / Self Care | Payer: BC Managed Care – PPO | Attending: Obstetrics & Gynecology | Admitting: Obstetrics & Gynecology

## 2021-11-28 ENCOUNTER — Inpatient Hospital Stay (HOSPITAL_COMMUNITY): Admission: AD | Admit: 2021-11-28 | Payer: BC Managed Care – PPO | Source: Home / Self Care | Admitting: Family Medicine

## 2021-11-28 ENCOUNTER — Inpatient Hospital Stay (HOSPITAL_COMMUNITY): Payer: BC Managed Care – PPO | Attending: Family Medicine

## 2021-11-28 ENCOUNTER — Encounter: Payer: BC Managed Care – PPO | Admitting: Advanced Practice Midwife

## 2021-11-28 ENCOUNTER — Encounter (HOSPITAL_COMMUNITY): Payer: Self-pay | Admitting: Obstetrics and Gynecology

## 2021-11-28 ENCOUNTER — Other Ambulatory Visit: Payer: Self-pay

## 2021-11-28 DIAGNOSIS — O365990 Maternal care for other known or suspected poor fetal growth, unspecified trimester, not applicable or unspecified: Secondary | ICD-10-CM | POA: Diagnosis present

## 2021-11-28 DIAGNOSIS — O99019 Anemia complicating pregnancy, unspecified trimester: Secondary | ICD-10-CM | POA: Diagnosis present

## 2021-11-28 DIAGNOSIS — O99824 Streptococcus B carrier state complicating childbirth: Secondary | ICD-10-CM | POA: Diagnosis present

## 2021-11-28 DIAGNOSIS — Z3A38 38 weeks gestation of pregnancy: Secondary | ICD-10-CM

## 2021-11-28 DIAGNOSIS — A749 Chlamydial infection, unspecified: Secondary | ICD-10-CM | POA: Diagnosis present

## 2021-11-28 DIAGNOSIS — D509 Iron deficiency anemia, unspecified: Secondary | ICD-10-CM | POA: Diagnosis present

## 2021-11-28 DIAGNOSIS — O9902 Anemia complicating childbirth: Secondary | ICD-10-CM | POA: Diagnosis present

## 2021-11-28 DIAGNOSIS — M0880 Other juvenile arthritis, unspecified site: Secondary | ICD-10-CM | POA: Diagnosis present

## 2021-11-28 DIAGNOSIS — O26893 Other specified pregnancy related conditions, third trimester: Secondary | ICD-10-CM | POA: Diagnosis present

## 2021-11-28 DIAGNOSIS — D563 Thalassemia minor: Secondary | ICD-10-CM | POA: Diagnosis present

## 2021-11-28 DIAGNOSIS — O0993 Supervision of high risk pregnancy, unspecified, third trimester: Secondary | ICD-10-CM

## 2021-11-28 DIAGNOSIS — O365930 Maternal care for other known or suspected poor fetal growth, third trimester, not applicable or unspecified: Secondary | ICD-10-CM | POA: Diagnosis not present

## 2021-11-28 DIAGNOSIS — O98819 Other maternal infectious and parasitic diseases complicating pregnancy, unspecified trimester: Secondary | ICD-10-CM | POA: Diagnosis present

## 2021-11-28 DIAGNOSIS — O09899 Supervision of other high risk pregnancies, unspecified trimester: Secondary | ICD-10-CM

## 2021-11-28 LAB — CBC
HCT: 29.8 % — ABNORMAL LOW (ref 36.0–46.0)
Hemoglobin: 9.1 g/dL — ABNORMAL LOW (ref 12.0–15.0)
MCH: 26.4 pg (ref 26.0–34.0)
MCHC: 30.5 g/dL (ref 30.0–36.0)
MCV: 86.4 fL (ref 80.0–100.0)
Platelets: 195 10*3/uL (ref 150–400)
RBC: 3.45 MIL/uL — ABNORMAL LOW (ref 3.87–5.11)
RDW: 15 % (ref 11.5–15.5)
WBC: 7.8 10*3/uL (ref 4.0–10.5)
nRBC: 0 % (ref 0.0–0.2)

## 2021-11-28 LAB — TYPE AND SCREEN
ABO/RH(D): A POS
Antibody Screen: NEGATIVE

## 2021-11-28 LAB — RPR: RPR Ser Ql: NONREACTIVE

## 2021-11-28 MED ORDER — OXYCODONE-ACETAMINOPHEN 5-325 MG PO TABS: 2.0000 | ORAL_TABLET | ORAL | Status: DC | PRN

## 2021-11-28 MED ORDER — LACTATED RINGERS IV SOLN: 500.0000 mL | Freq: Once | INTRAVENOUS | Status: AC

## 2021-11-28 MED ORDER — OXYCODONE HCL 5 MG PO TABS: 10.0000 mg | ORAL_TABLET | ORAL | Status: DC | PRN

## 2021-11-28 MED ORDER — LACTATED RINGERS IV SOLN: INTRAVENOUS | Status: DC

## 2021-11-28 MED ORDER — EPHEDRINE 5 MG/ML INJ: 10.0000 mg | INTRAVENOUS | Status: DC | PRN

## 2021-11-28 MED ORDER — IBUPROFEN 600 MG PO TABS
600.0000 mg | ORAL_TABLET | Freq: Four times a day (QID) | ORAL | Status: DC
  Administered 2021-11-29 (×3): 600 mg via ORAL
  Filled 2021-11-28 (×3): qty 1

## 2021-11-28 MED ORDER — SIMETHICONE 80 MG PO CHEW: 80.0000 mg | CHEWABLE_TABLET | ORAL | Status: DC | PRN

## 2021-11-28 MED ORDER — BENZOCAINE-MENTHOL 20-0.5 % EX AERO: 1.0000 "application " | INHALATION_SPRAY | CUTANEOUS | Status: DC | PRN

## 2021-11-28 MED ORDER — PRENATAL MULTIVITAMIN CH
1.0000 | ORAL_TABLET | Freq: Every day | ORAL | Status: DC
  Administered 2021-11-29: 1 via ORAL
  Filled 2021-11-28: qty 1

## 2021-11-28 MED ORDER — DIBUCAINE (PERIANAL) 1 % EX OINT: 1.0000 "application " | TOPICAL_OINTMENT | CUTANEOUS | Status: DC | PRN

## 2021-11-28 MED ORDER — FENTANYL-BUPIVACAINE-NACL 0.5-0.125-0.9 MG/250ML-% EP SOLN
EPIDURAL | Status: DC | PRN
  Administered 2021-11-28: 12 mL/h via EPIDURAL

## 2021-11-28 MED ORDER — PHENYLEPHRINE 80 MCG/ML (10ML) SYRINGE FOR IV PUSH (FOR BLOOD PRESSURE SUPPORT): 80.0000 ug | PREFILLED_SYRINGE | INTRAVENOUS | Status: DC | PRN

## 2021-11-28 MED ORDER — SODIUM CHLORIDE 0.9 % IV SOLN
5.0000 10*6.[IU] | Freq: Once | INTRAVENOUS | Status: AC
  Administered 2021-11-28: 5 10*6.[IU] via INTRAVENOUS
  Filled 2021-11-28: qty 5

## 2021-11-28 MED ORDER — PENICILLIN G POT IN DEXTROSE 60000 UNIT/ML IV SOLN
3.0000 10*6.[IU] | INTRAVENOUS | Status: DC
  Administered 2021-11-28: 3 10*6.[IU] via INTRAVENOUS
  Filled 2021-11-28 (×2): qty 50

## 2021-11-28 MED ORDER — FENTANYL-BUPIVACAINE-NACL 0.5-0.125-0.9 MG/250ML-% EP SOLN
EPIDURAL | Status: AC
  Filled 2021-11-28: qty 250

## 2021-11-28 MED ORDER — COCONUT OIL OIL: 1.0000 "application " | TOPICAL_OIL | Status: DC | PRN

## 2021-11-28 MED ORDER — OXYTOCIN-SODIUM CHLORIDE 30-0.9 UT/500ML-% IV SOLN
2.5000 [IU]/h | INTRAVENOUS | Status: DC
  Administered 2021-11-28: 2.5 [IU]/h via INTRAVENOUS
  Filled 2021-11-28: qty 500

## 2021-11-28 MED ORDER — LACTATED RINGERS IV SOLN: 500.0000 mL | INTRAVENOUS | Status: DC | PRN

## 2021-11-28 MED ORDER — SENNOSIDES-DOCUSATE SODIUM 8.6-50 MG PO TABS
2.0000 | ORAL_TABLET | Freq: Every day | ORAL | Status: DC
  Filled 2021-11-28: qty 2

## 2021-11-28 MED ORDER — FENTANYL-BUPIVACAINE-NACL 0.5-0.125-0.9 MG/250ML-% EP SOLN: 12.0000 mL/h | EPIDURAL | Status: DC | PRN

## 2021-11-28 MED ORDER — ONDANSETRON HCL 4 MG PO TABS: 4.0000 mg | ORAL_TABLET | ORAL | Status: DC | PRN

## 2021-11-28 MED ORDER — DIPHENHYDRAMINE HCL 25 MG PO CAPS: 25.0000 mg | ORAL_CAPSULE | Freq: Four times a day (QID) | ORAL | Status: DC | PRN

## 2021-11-28 MED ORDER — OXYCODONE-ACETAMINOPHEN 5-325 MG PO TABS: 1.0000 | ORAL_TABLET | ORAL | Status: DC | PRN

## 2021-11-28 MED ORDER — TERBUTALINE SULFATE 1 MG/ML IJ SOLN: 0.2500 mg | Freq: Once | INTRAMUSCULAR | Status: DC | PRN

## 2021-11-28 MED ORDER — OXYCODONE HCL 5 MG PO TABS: 5.0000 mg | ORAL_TABLET | ORAL | Status: DC | PRN

## 2021-11-28 MED ORDER — WITCH HAZEL-GLYCERIN EX PADS: 1.0000 "application " | MEDICATED_PAD | CUTANEOUS | Status: DC | PRN

## 2021-11-28 MED ORDER — FENTANYL CITRATE (PF) 100 MCG/2ML IJ SOLN: 100.0000 ug | INTRAMUSCULAR | Status: DC | PRN

## 2021-11-28 MED ORDER — ONDANSETRON HCL 4 MG/2ML IJ SOLN
4.0000 mg | Freq: Four times a day (QID) | INTRAMUSCULAR | Status: DC | PRN
  Administered 2021-11-28: 4 mg via INTRAVENOUS
  Filled 2021-11-28: qty 2

## 2021-11-28 MED ORDER — ONDANSETRON HCL 4 MG/2ML IJ SOLN: 4.0000 mg | INTRAMUSCULAR | Status: DC | PRN

## 2021-11-28 MED ORDER — SOD CITRATE-CITRIC ACID 500-334 MG/5ML PO SOLN
30.0000 mL | ORAL | Status: DC | PRN
Start: 2021-11-28 — End: 2021-11-28

## 2021-11-28 MED ORDER — OXYTOCIN BOLUS FROM INFUSION
333.0000 mL | Freq: Once | INTRAVENOUS | Status: AC
Start: 2021-11-28 — End: 2021-11-28
  Administered 2021-11-28: 333 mL via INTRAVENOUS

## 2021-11-28 MED ORDER — LIDOCAINE HCL (PF) 1 % IJ SOLN
INTRAMUSCULAR | Status: DC | PRN
Start: 2021-11-28 — End: 2021-11-28
  Administered 2021-11-28: 2 mL via EPIDURAL
  Administered 2021-11-28: 10 mL via EPIDURAL

## 2021-11-28 MED ORDER — OXYTOCIN-SODIUM CHLORIDE 30-0.9 UT/500ML-% IV SOLN: 1.0000 m[IU]/min | INTRAVENOUS | Status: DC

## 2021-11-28 MED ORDER — ACETAMINOPHEN 325 MG PO TABS: 650.0000 mg | ORAL_TABLET | ORAL | Status: DC | PRN

## 2021-11-28 MED ORDER — LIDOCAINE HCL (PF) 1 % IJ SOLN: 30.0000 mL | INTRAMUSCULAR | Status: DC | PRN

## 2021-11-28 MED ORDER — DIPHENHYDRAMINE HCL 50 MG/ML IJ SOLN: 12.5000 mg | INTRAMUSCULAR | Status: DC | PRN

## 2021-11-28 NOTE — MAU Note (Signed)
...  Caitlyn Green is a 21 y.o. at [redacted]w[redacted]d here in MAU reporting: CTX since last night that became regular around 0740 this morning. She reports they are currently 3-4 minutes apart. Endorses bloody show, denies LOF. +FM.   GBS+. FGR.   Pain score:  10/10 lower abdomen 10/10 lower back  FHT: 137 initial external Lab orders placed from triage: MAU Labor

## 2021-11-28 NOTE — Lactation Note (Signed)
This note was copied from a baby's chart. Lactation Consultation Note Mom declines Lactation services.  Patient Name: Caitlyn Green [id number] Date: 11/28/2021   Age:21 hours  Maternal Data    Feeding Nipple Type: Slow - flow  LATCH Score                    Lactation Tools Discussed/Used    Interventions    Discharge    Consult Status Consult Status: Complete    Charyl Dancer 11/28/2021, 9:58 PM

## 2021-11-28 NOTE — H&P (Signed)
OBSTETRIC ADMISSION HISTORY AND PHYSICAL  Caitlyn Green is a 21 y.o. female G2P1001 with IUP at 441w0d by LMP presenting for SOL. She reports +FMs, no LOF, no VB, no blurry vision, headaches, peripheral edema, or RUQ pain.  She plans on breast feeding. She requests Nexplanon for birth control postpartum.  [facility]   Dating: By LMP --->  Estimated Date of Delivery: 12/12/21  Sono:   @[redacted]w[redacted]d , normal anatomy, cephalic presentation, anterior placental lie, 2562 g, 11% EFW (AC 7.2%)  Prenatal History/Complications:  FGR (AC < 7.2%) Juvenile idiopathic arthritis  Iron deficiency anemia  Alpha thalassemia carrier  Chlamydia in pregnancy (TOC negative)   Past Medical History: Past Medical History:  Diagnosis Date   Back strain    Rheumatoid arthritis (HCC)     Past Surgical History: Past Surgical History:  Procedure Laterality Date   NO PAST SURGERIES      Obstetrical History: OB History     Gravida  2   Para  1   Term  1   Preterm  0   AB  0   Living  1      SAB  0   IAB  0   Ectopic  0   Multiple  0   Live Births  1           Social History Social History   Socioeconomic History   Marital status: Single    Spouse name: Not on file   Number of children: Not on file   Years of education: Not on file   Highest education level: Not on file  Occupational History   Occupation: [employer]   Tobacco Use   Smoking status: Never   Smokeless tobacco: Never  Vaping Use   Vaping Use: Former  Substance and Sexual Activity   Alcohol use: No   Drug use: No   Sexual activity: Not Currently    Birth control/protection: None  Other Topics Concern   Not on file  Social History Narrative   Not on file   Social Determinants of Health   Financial Resource Strain: Low Risk    Difficulty of Paying Living Expenses: Not very hard  Food Insecurity: No Food Insecurity   Worried About Programme researcher, broadcasting/film/videounning Out of Food in the Last  Year: Never true   Ran Out of Food in the Last Year: Never true  Transportation Needs: No Transportation Needs   Lack of Transportation (Medical): No   Lack of Transportation (Non-Medical): No  Physical Activity: Insufficiently Active   Days of Exercise per Week: 2 days   Minutes of Exercise per Session: 60 min  Stress: Unknown   Feeling of Stress : Patient refused  Social Connections: Press photographerocially Integrated   Frequency of Communication with Friends and Family: More than three times a week   Frequency of Social Gatherings with Friends and Family: More than three times a week   Attends Religious Services: More than 4 times per year   Active Member of [employer] or Organizations: Yes   Attends Engineer, structuralClub or Organization Meetings: More than 4 times per year   Marital Status: Living with partner    Family History: Family History  Problem Relation Age of Onset   Healthy Mother    Cancer Father    Arthritis Maternal Grandmother    Healthy Sister    Healthy Sister    Healthy Brother     Allergies: No Known Allergies  Medications Prior to Admission  Medication Sig Dispense Refill Last Dose   ferrous sulfate 325 (65 FE) MG tablet Take 1 tablet (325 mg total) by mouth every other day. 45 tablet 2 11/27/2021   prenatal vitamin w/FE, FA (PRENATAL 1 + 1) 27-1 MG TABS tablet Take 1 tablet by mouth daily at 12 noon. 30 tablet 12 11/27/2021   acetaminophen (TYLENOL) 500 MG tablet Take 500 mg by mouth every 6 (six) hours as needed. (Patient not taking: Reported on 11/27/2021)      NIFEdipine (PROCARDIA) 10 MG capsule Take 1 capsule (10 mg total) by mouth 3 (three) times daily as needed (contractions). 20 capsule 0 11/26/21 at 1445   promethazine (PHENERGAN) 25 MG tablet Take 1 tablet (25 mg total) by mouth every 6 (six) hours as needed for nausea or vomiting. (Patient not taking: Reported on 11/27/2021) 30 tablet 1      Review of Systems  All systems reviewed and negative except as stated in HPI  Blood  pressure 117/72, pulse 74, temperature 97.9 F (36.6 C), temperature source Oral, resp. rate 17, height  (1.803 m), weight 68.9 kg, last menstrual period 03/07/2021, SpO2 100 %, not currently breastfeeding.  General appearance: alert, cooperative, and no distress Lungs: normal work of breathing on room air  Heart: normal rate, warm and well perfused  Abdomen: soft, non-tender, gravid  Extremities: no LE edema or calf tenderness to palpation   Presentation: Cephalic per RN Fetal monitoring: Baseline 125 bpm, moderate variability, + accels, no decels  Uterine activity: Irregular contractions every 2-6 minutes  Dilation: 7 Effacement (%): 100 Station: -1 Exam by:: Lestine Box, RN  Prenatal labs: ABO, Rh: --/--/PENDING (05/25 1004) Antibody: PENDING (05/25 1004) Rubella: 3.23 (11/23 1218) RPR: NON REACTIVE (05/06 2309)  HBsAg: Negative (11/23 1218)  HIV: Non Reactive (03/14 0815)  GBS: Positive/-- (05/09 1640)  2 hr Glucola normal  Genetic screening - LR NIPS, silent carrier for alpha thalassemia  Anatomy US normal   Prenatal Transfer Tool  Maternal Diabetes: No Genetic Screening: LR NIPS, silent carrier for alpha thalassemia  Maternal Ultrasounds/Referrals: FGR, otherwise normal  Fetal Ultrasounds or other Referrals:  None Maternal Substance Abuse:  No Significant Maternal Medications:  None Significant Maternal Lab Results: Group B Strep positive  Results for orders placed or performed during the hospital encounter of 11/28/21 (from the past 24 hour(s))  CBC   Collection Time: 11/28/21 10:04 AM  Result Value Ref Range   WBC 7.8 4.0 - 10.5 K/uL   RBC 3.45 (L) 3.87 - 5.11 MIL/uL   Hemoglobin 9.1 (L) 12.0 - 15.0 g/dL   HCT 52.8 (L) 41.3 - 24.4 %   MCV 86.4 80.0 - 100.0 fL   MCH 26.4 26.0 - 34.0 pg   MCHC 30.5 30.0 - 36.0 g/dL   RDW 01.0 27.2 - 53.6 %   Platelets 195 150 - 400 K/uL   nRBC 0.0 0.0 - 0.2 %  Type and screen   Collection Time: 11/28/21 10:04 AM  Result  Value Ref Range   ABO/RH(D) PENDING    Antibody Screen PENDING    Sample Expiration      12/01/2021,2359 Performed at [facility], [address], [place], [place] [address]     Patient Active Problem List   Diagnosis Date Noted   Normal labor 11/28/2021   Fetal growth restriction antepartum 11/27/2021   Iron deficiency anemia during pregnancy 10/30/2021   Abnormal weight gain in pregnancy 09/14/2021   Chlamydia infection affecting pregnancy 06/01/2021  Encounter for supervision of high risk pregnancy in third trimester, antepartum 05/29/2021   Short interval between pregnancies affecting pregnancy, antepartum 05/29/2021   Alpha thalassemia silent carrier 05/29/2021   JIA (juvenile idiopathic arthritis) (HCC) 11/16/2018   Asthma 04/05/2012    Assessment/Plan:  Caitlyn Green is a 21 y.o. G2P1001 at [redacted]w[redacted]d here for SOL.   #Labor: Progressing well. Active phase. Will continue expectant management. Anticipate SVD.  #Pain: PRN #FWB: Cat 1  #ID:  GBS pos; PCN #MOF: Breast  #MOC: Nexplanon outpatient   #JIA: No medications currently.   #Iron deficiency anemia: Hgb 9.1 on admission. Plan for TXA at delivery.   #Hx of chlamydia: Negative on 5/6.   Worthy Rancher, MD  11/28/2021, 10:57 AM

## 2021-11-28 NOTE — Anesthesia Preprocedure Evaluation (Addendum)
Anesthesia Evaluation  Patient identified by MRN, date of birth, ID band Patient awake    Reviewed: Allergy & Precautions, NPO status , Patient's Chart, lab work & pertinent test results  Airway Mallampati: II  TM Distance: >3 FB Neck ROM: Full    Dental no notable dental hx.    Pulmonary asthma ,    Pulmonary exam normal breath sounds clear to auscultation       Cardiovascular (-) hypertensionnegative cardio ROS Normal cardiovascular exam Rhythm:Regular Rate:Normal     Neuro/Psych negative neurological ROS  negative psych ROS   GI/Hepatic negative GI ROS, Neg liver ROS,   Endo/Other  negative endocrine ROS  Renal/GU negative Renal ROS  negative genitourinary   Musculoskeletal  (+) Arthritis , Rheumatoid disorders,    Abdominal   Peds negative pediatric ROS (+)  Hematology negative hematology ROS (+) Hb9.1, plt 195   Anesthesia Other Findings   Reproductive/Obstetrics (+) Pregnancy                            Anesthesia Physical Anesthesia Plan  ASA: 2  Anesthesia Plan: Epidural   Post-op Pain Management:    Induction:   PONV Risk Score and Plan: 2  Airway Management Planned: Natural Airway  Additional Equipment: None  Intra-op Plan:   Post-operative Plan:   Informed Consent: I have reviewed the patients History and Physical, chart, labs and discussed the procedure including the risks, benefits and alternatives for the proposed anesthesia with the patient or authorized representative who has indicated his/her understanding and acceptance.       Plan Discussed with:   Anesthesia Plan Comments:         Anesthesia Quick Evaluation

## 2021-11-28 NOTE — Progress Notes (Signed)
Patient ID: Caitlyn Green, female   DOB: 10/27/2000, 21 y.o.   MRN: [id number]  Labor Progress Note Caitlyn Green is a 21 y.o. G2P1001 at 5259w0d presented for spontaneous onset of labor   S:  Pt resting in bed with MGM and FOB at bedside for support, very excited and engaged in the process.  O:  BP 119/68   Pulse 73   Temp 98.1 F (36.7 C) (Oral)   Resp 17   Ht 5\' 11"  (1.803 m)   Wt 151 lb 14.4 oz (68.9 kg)   LMP 03/07/2021 (Approximate)   SpO2 97%   BMI 21.19 kg/m  EFM: baseline 120 bpm/ moderate variability/ 15x15 accels/ variable decels  Toco/IUPC: q2-813min SVE: Dilation: Lip/rim Effacement (%): 100 Station: 0 Presentation: Vertex Exam by:: Roma SchanzJamila Deajah Erkkila, CNM Pitocin: none   A/P: 21 y.o. G2P1001 7959w0d  1. Labor: progressing well without augmentation 2. FWB: Cat 1 3. Pain: well-controlled with epidural 4. GBS positive - adequately treated.  AROM completed with clear return of fluid. Pt repositioned immediately to throne, baby descended to 0/+1 station. Still needs to engage further so repositioned to left lateral with the peanut ball. Anticipate SVD.  Bernerd LimboJamilla R Masayuki Sakai, CNM 4:30 PM

## 2021-11-28 NOTE — Discharge Summary (Signed)
Postpartum Discharge Summary      Patient Name: Caitlyn Green DOB: Jan 16, 2001 MRN: [id number]  Date of admission: 11/28/2021 Delivery date:11/28/2021  Delivering provider: Gaylan Gerold R  Date of discharge: 11/29/2021  Admitting diagnosis: Normal labor [O80, Z37.9] Intrauterine pregnancy: [redacted]w[redacted]d    Secondary diagnosis:  Principal Problem:   Normal labor Active Problems:   JIA (juvenile idiopathic arthritis) ([facility]   Encounter for supervision of high risk pregnancy in third trimester, antepartum   Short interval between pregnancies affecting pregnancy, antepartum   Alpha thalassemia silent carrier   Chlamydia infection affecting pregnancy   Iron deficiency anemia during pregnancy   Fetal growth restriction antepartum  Additional problems: Iron deficiency anemia    Discharge diagnosis: Term Pregnancy Delivered                                              Post partum procedures: Iron transfusion Augmentation: AROM Complications: None  Hospital course: Onset of Labor With Vaginal Delivery      21y.o. yo [id number] 366w0das admitted in Active Labor on 11/28/2021. Patient had an uncomplicated labor course as follows:  Membrane Rupture Time/Date: 4:15 PM ,11/28/2021   Delivery Method:Vaginal, Spontaneous  Episiotomy: None  Lacerations:  None  Patient had an uncomplicated postpartum course.  She is ambulating, tolerating a regular diet, passing flatus, and urinating well. Patient is discharged home in stable condition on 11/29/21.  Newborn Data: Birth date:11/28/2021  Birth time:5:36 PM  Gender:Female  Living status:Living  Apgars:8 ,9  Weight:2700 g   Magnesium Sulfate received: No BMZ received: No Rhophylac:N/A MMR:N/A T-DaP:Given prenatally Flu: N/A Transfusion:No  Physical exam  Vitals:   11/28/21 1950 11/28/21 2049 11/29/21 0028 11/29/21 0500  BP: 122/82 113/81 112/74 (!) 119/59  Pulse: 89 80 88 (!) 59  Resp: _0 Temp: 98.9 F (37.2 C) 98.8 F  (37.1 C) 98.4 F (36.9 C) 98.6 F (37 C)  TempSrc: Oral Oral Oral Oral  SpO2: 100% 100% 100%   Weight:      Height:       General: alert Lochia: appropriate Uterine Fundus: firm Incision: Healing well with no significant drainage DVT Evaluation: No evidence of DVT seen on physical exam. Labs: Lab Results  Component Value Date   WBC 9.9 11/29/2021   HGB 7.6 (L) 11/29/2021   HCT 23.2 (L) 11/29/2021   MCV 84.1 11/29/2021   PLT 158 11/29/2021      Latest Ref Rng & Units 04/17/2015    8:15 PM  CMP  Glucose 65 - 99 mg/dL 102    BUN 6 - 20 mg/dL 10    Creatinine 0.50 - 1.00 mg/dL 0.74    Sodium 135 - 145 mmol/L 137    Potassium 3.5 - 5.1 mmol/L 3.6    Chloride 101 - 111 mmol/L 103    CO2 22 - 32 mmol/L 25    Calcium 8.9 - 10.3 mg/dL 9.4    Total Protein 6.5 - 8.1 g/dL 8.3    Total Bilirubin 0.3 - 1.2 mg/dL 0.6    Alkaline Phos 50 - 162 U/L 70    AST 15 - 41 U/L 25    ALT 14 - 54 U/L 13     Edinburgh Score:    11/28/2021    8:49 PM  Edinburgh Postnatal Depression Scale Screening Tool  I have been able to laugh and see the funny side of things. 1  I have looked forward with enjoyment to things. 0  I have blamed myself unnecessarily when things went wrong. 2  I have been anxious or worried for no good reason. 1  I have felt scared or panicky for no good reason. 1  Things have been getting on top of me. 1  I have been so unhappy that I have had difficulty sleeping. 0  I have felt sad or miserable. 1  I have been so unhappy that I have been crying. 1  The thought of harming myself has occurred to me. 0  Edinburgh Postnatal Depression Scale Total 8   After visit meds:  Allergies as of 11/29/2021   No Known Allergies      Medication List     STOP taking these medications    NIFEdipine 10 MG capsule Commonly known as: Procardia       TAKE these medications    acetaminophen 325 MG tablet Commonly known as: Tylenol Take 2 tablets (650 mg total) by mouth  every 4 (four) hours as needed for up to 20 days (for pain scale < 4). What changed:  medication strength how much to take when to take this reasons to take this   ferrous sulfate 325 (65 FE) MG tablet Take 1 tablet (325 mg total) by mouth every other day.   ibuprofen 600 MG tablet Commonly known as: ADVIL Take 1 tablet (600 mg total) by mouth every 6 (six) hours.   prenatal vitamin w/FE, FA 27-1 MG Tabs tablet Take 1 tablet by mouth daily at 12 noon.       Discharge home in stable condition Infant Feeding: Breast Infant Disposition:home with mother Discharge instruction: per After Visit Summary and Postpartum booklet. Activity: Advance as tolerated. Pelvic rest for 6 weeks.  Diet: routine diet Future Appointments: Future Appointments  Date Time Provider [place]  01/08/2022  1:50 PM Myrtis Ser, CNM [facility]   Follow up Visit: Appointment already set, no message sent.   Renard Matter, MD, MPH OB Fellow, Faculty Practice

## 2021-11-28 NOTE — Anesthesia Procedure Notes (Signed)
Epidural Patient location during procedure: OB Start time: 11/28/2021 10:33 AM End time: 11/28/2021 10:40 AM  Staffing Anesthesiologist: Lannie Fields, DO Performed: anesthesiologist   Preanesthetic Checklist Completed: patient identified, IV checked, risks and benefits discussed, monitors and equipment checked, pre-op evaluation and timeout performed  Epidural Patient position: sitting Prep: DuraPrep and site prepped and draped Patient monitoring: continuous pulse ox, blood pressure, heart rate and cardiac monitor Approach: midline Location: L3-L4 Injection technique: LOR air  Needle:  Needle type: Tuohy  Needle gauge: 17 G Needle length: 9 cm Needle insertion depth: 5 cm Catheter type: closed end flexible Catheter size: 19 Gauge Catheter at skin depth: 10 cm Test dose: negative  Assessment Sensory level: T8 Events: blood not aspirated, injection not painful, no injection resistance, no paresthesia and negative IV test  Additional Notes Patient identified. Risks/Benefits/Options discussed with patient including but not limited to bleeding, infection, nerve damage, paralysis, failed block, incomplete pain control, headache, blood pressure changes, nausea, vomiting, reactions to medication both or allergic, itching and postpartum back pain. Confirmed with bedside nurse the patient's most recent platelet count. Confirmed with patient that they are not currently taking any anticoagulation, have any bleeding history or any family history of bleeding disorders. Patient expressed understanding and wished to proceed. All questions were answered. Sterile technique was used throughout the entire procedure. Please see nursing notes for vital signs. Test dose was given through epidural catheter and negative prior to continuing to dose epidural or start infusion. Warning signs of high block given to the patient including shortness of breath, tingling/numbness in hands, complete motor  block, or any concerning symptoms with instructions to call for help. Patient was given instructions on fall risk and not to get out of bed. All questions and concerns addressed with instructions to call with any issues or inadequate analgesia.  Reason for block:procedure for pain

## 2021-11-29 LAB — CBC
HCT: 23.2 % — ABNORMAL LOW (ref 36.0–46.0)
Hemoglobin: 7.6 g/dL — ABNORMAL LOW (ref 12.0–15.0)
MCH: 27.5 pg (ref 26.0–34.0)
MCHC: 32.8 g/dL (ref 30.0–36.0)
MCV: 84.1 fL (ref 80.0–100.0)
Platelets: 158 10*3/uL (ref 150–400)
RBC: 2.76 MIL/uL — ABNORMAL LOW (ref 3.87–5.11)
RDW: 15 % (ref 11.5–15.5)
WBC: 9.9 10*3/uL (ref 4.0–10.5)
nRBC: 0 % (ref 0.0–0.2)

## 2021-11-29 MED ORDER — SODIUM CHLORIDE 0.9 % IV SOLN
500.0000 mg | Freq: Once | INTRAVENOUS | Status: AC
  Administered 2021-11-29: 500 mg via INTRAVENOUS
  Filled 2021-11-29: qty 500

## 2021-11-29 MED ORDER — IBUPROFEN 600 MG PO TABS: 600.0000 mg | ORAL_TABLET | Freq: Four times a day (QID) | ORAL | 0 refills | Status: DC

## 2021-11-29 MED ORDER — ACETAMINOPHEN 325 MG PO TABS: 650.0000 mg | ORAL_TABLET | ORAL | 0 refills | Status: AC | PRN

## 2021-11-29 NOTE — Anesthesia Postprocedure Evaluation (Signed)
Anesthesia Post Note  Patient: Caitlyn Green  Procedure(s) Performed: AN AD HOC LABOR EPIDURAL     Patient location during evaluation: Mother Baby Anesthesia Type: Epidural Level of consciousness: awake and alert and oriented Pain management: satisfactory to patient Vital Signs Assessment: post-procedure vital signs reviewed and stable Respiratory status: respiratory function stable Cardiovascular status: stable Postop Assessment: no headache, no backache, epidural receding, patient able to bend at knees, no signs of nausea or vomiting, adequate PO intake and able to ambulate Anesthetic complications: no   No notable events documented.  Last Vitals:  Vitals:   11/29/21 0028 11/29/21 0500  BP: 112/74 (!) 119/59  Pulse: 88 (!) 59  Resp: 18 18  Temp: 36.9 C 37 C  SpO2: 100%     Last Pain:  Vitals:   11/29/21 0715  TempSrc:   PainSc: 0-No pain   Pain Goal:                   Caitlyn Green

## 2021-12-06 ENCOUNTER — Telehealth (HOSPITAL_COMMUNITY): Payer: Self-pay | Admitting: *Deleted

## 2021-12-06 NOTE — Telephone Encounter (Signed)
Mom reports feeling good. No concerns about herself at this time. Mom asked if nurse could call back around 2:30pm today for EDPS since she's driving right now. [facility] score=8) Mom reports baby is doing well. Feeding, peeing, and pooping without difficulty. Safe sleep reviewed. Mom reports no concerns about baby at present.  Duffy Rhody, RN 12-06-2021 at 12:46pm

## 2021-12-06 NOTE — Telephone Encounter (Signed)
Voice mailbox is full. Unable to leave message.   Duffy RhodyLori Chenel Wernli, RN 12-06-2021 at 3:25pm

## 2022-01-08 ENCOUNTER — Ambulatory Visit (INDEPENDENT_AMBULATORY_CARE_PROVIDER_SITE_OTHER): Payer: Medicaid Other | Admitting: Advanced Practice Midwife

## 2022-01-08 ENCOUNTER — Other Ambulatory Visit (HOSPITAL_COMMUNITY)
Admission: RE | Admit: 2022-01-08 | Discharge: 2022-01-08 | Disposition: A | Discharge status: Home / Self Care | Payer: BC Managed Care – PPO | Source: Ambulatory Visit | Attending: Advanced Practice Midwife | Admitting: Advanced Practice Midwife

## 2022-01-08 ENCOUNTER — Encounter: Payer: Self-pay | Admitting: Advanced Practice Midwife

## 2022-01-08 DIAGNOSIS — Z124 Encounter for screening for malignant neoplasm of cervix: Secondary | ICD-10-CM

## 2022-01-08 NOTE — Progress Notes (Signed)
POSTPARTUM VISIT Patient name: Caitlyn Green MRN [id number]  Date of birth: 03-Aug-2000 Chief Complaint:   Postpartum Care  History of Present Illness:   Caitlyn Green is a 21 y.o. G65P2002 African American female being seen today for a postpartum visit. She is 6 weeks postpartum following a spontaneous vaginal delivery at 38.0 gestational weeks. IOL: no.  Anesthesia: epidural.  Laceration: none.  Complications: none. Inpatient contraception: no.   Pregnancy complicated by FGR  (sched for IOL later on the same day that she went into labor spontaneously) Tobacco use: no. Substance use disorder: no. Last pap smear: never (turned 21 in May)  No LMP recorded.  Postpartum course has been uncomplicated. Bleeding none. Bowel function is normal. Bladder function is normal. Urinary incontinence? no, fecal incontinence? no Patient is sexually active. Last sexual activity:  about 1.5 weeks ago . Desired contraception: Nexplanon. Patient does not know about a pregnancy in the future.  Desired family size is unsure number of children.   Upstream - 01/08/22 1358       Pregnancy Intention Screening   Does the patient want to become pregnant in the next year? No    Does the patient's partner want to become pregnant in the next year? No    Would the patient like to discuss contraceptive options today? Yes            The pregnancy intention screening data noted above was reviewed. Potential methods of contraception were discussed. The patient elected to proceed with No data recorded.  Edinburgh Postpartum Depression Screening: negative  Edinburgh Postnatal Depression Scale - 01/08/22 1402       Edinburgh Postnatal Depression Scale:  In the Past 7 Days   I have been able to laugh and see the funny side of things. 0    I have looked forward with enjoyment to things. 0    I have blamed myself unnecessarily when things went wrong. 1    I have been anxious or worried for no good reason. 1    I have  felt scared or panicky for no good reason. 0    Things have been getting on top of me. 1    I have been so unhappy that I have had difficulty sleeping. 0    I have felt sad or miserable. 0    I have been so unhappy that I have been crying. 0    The thought of harming myself has occurred to me. 0    Edinburgh Postnatal Depression Scale Total 3                09/17/2021   10:22 AM 05/29/2021   11:22 AM 04/30/2020    4:19 PM 03/13/2020    2:51 PM  GAD 7 : Generalized Anxiety Score  Nervous, Anxious, on Edge 0 2 0 1  Control/stop worrying 0 2 0 0  Worry too much - different things 0 2 0 1  Trouble relaxing 0 2 0 0  Restless 0 1 0 0  Easily annoyed or irritable 2 3 2 1   Afraid - awful might happen 0 1 0 0  Total GAD 7 Score 2 13 2 3   Anxiety Difficulty    Not difficult at all     Baby's course has been uncomplicated. Baby is feeding by bottle. Infant has a pediatrician/family doctor? Yes.  Childcare strategy if returning to work/school: n/a-not working/going back to school.  Pt has material needs met for her and baby: Yes.  Review of Systems:   Pertinent items are noted in HPI Denies Abnormal vaginal discharge w/ itching/odor/irritation, headaches, visual changes, shortness of breath, chest pain, abdominal pain, severe nausea/vomiting, or problems with urination or bowel movements. Pertinent History Reviewed:  Reviewed past medical,surgical, obstetrical and family history.  Reviewed problem list, medications and allergies. OB History  Gravida Para Term Preterm AB Living  2 2 2  0 0 2  SAB IAB Ectopic Multiple Live Births  0 0 0 0 2    # Outcome Date GA Lbr Len/2nd Weight Sex Delivery Anes PTL Lv  2 Term 11/28/21 87w0d19:25 / 00:11 5 lb 15.2 oz (2.7 kg) F Vag-Spont EPI  LIV     Birth Comments: wnl  1 Term 10/25/20 322w1d5 lb 9 oz (2.523 kg) M Vag-Spont EPI N LIV     Complications: IUGR (intrauterine growth restriction) affecting care of mother   Physical Assessment:    Vitals:   01/08/22 1403  BP: 115/66  Pulse: 80  Weight: 132 lb (59.9 kg)  Height: 5' 11"  (1.803 m)  Body mass index is 18.41 kg/m.       Physical Examination:   General appearance: alert, well appearing, and in no distress  Mental status: alert, oriented to person, place, and time  Skin: warm & dry   Cardiovascular: normal heart rate noted   Respiratory: normal respiratory effort, no distress   Breasts: deferred, no complaints   Abdomen: soft, non-tender   Pelvic: normal external genitalia, vulva, vagina, cervix, uterus and adnexa. Thin prep pap obtained: Yes  Rectal: not examined  Extremities: Edema: none   Chaperone: AmCelene Squibb       No results found for this or any previous visit (from the past 24 hour(s)).  Assessment & Plan:  1) Postpartum exam 2) Six wks s/p spontaneous vaginal delivery 3) bottle feeding 4) Depression screening- neg 5) Contraception counseling- for Nexplanon 6) Cervical cancer screening- Pap collected  Essential components of care per ACOG recommendations:  1.  Mood and well being:  If positive depression screen, discussed and plan developed.  If using tobacco we discussed reduction/cessation and risk of relapse If current substance abuse, we discussed and referral to local resources was offered.   2. Infant care and feeding:  If breastfeeding, discussed returning to work, pumping, breastfeeding-associated pain, guidance regarding return to fertility while lactating if not using another method. If needed, patient was provided with a letter to be allowed to pump q 2-3hrs to support lactation in a private location with access to a refrigerator to store breastmilk.   Recommended that all caregivers be immunized for flu, pertussis and other preventable communicable diseases If pt does not have material needs met for her/baby, referred to local resources for help obtaining these.  3. Sexuality, contraception and birth spacing Provided guidance  regarding sexuality, management of dyspareunia, and resumption of intercourse Discussed avoiding interpregnancy interval <44m44m and recommended birth spacing of 18 months  4. Sleep and fatigue Discussed coping options for fatigue and sleep disruption Encouraged family/partner/community support of 4 hrs of uninterrupted sleep to help with mood and fatigue  5. Physical recovery  If pt had a C/S, assessed incisional pain and providing guidance on normal vs prolonged recovery If pt had a laceration, perineal healing and pain reviewed.  If urinary or fecal incontinence, discussed management and referred to PT or uro/gyn if indicated  Patient is safe to resume physical activity. Discussed attainment of healthy weight.  6.  Chronic  disease management Discussed pregnancy complications if any, and their implications for future childbearing and long-term maternal health. Review recommendations for prevention of recurrent pregnancy complications, such as 17 hydroxyprogesterone caproate to reduce risk for recurrent PTB not applicable, or aspirin to reduce risk of preeclampsia not applicable. Pt had GDM: no. If yes, 2hr GTT scheduled: not applicable. Reviewed medications and non-pregnant dosing including consideration of whether pt is breastfeeding using a reliable resource such as LactMed: not applicable Referred for f/u w/ PCP or subspecialist providers as indicated: not applicable  7. Health maintenance Mammogram at 21yo or earlier if indicated Pap smears as indicated  Meds: No orders of the defined types were placed in this encounter.   Follow-up: Return for 1st available, Neplanon insertion.   No orders of the defined types were placed in this encounter.   Myrtis Ser CNM 01/08/2022 2:23 PM

## 2022-01-13 LAB — CYTOLOGY - PAP
Chlamydia: NEGATIVE
Comment: NEGATIVE
Comment: NEGATIVE
Comment: NORMAL
High risk HPV: POSITIVE — AB
Neisseria Gonorrhea: NEGATIVE

## 2022-01-15 ENCOUNTER — Encounter: Payer: Self-pay | Admitting: Advanced Practice Midwife

## 2022-01-15 DIAGNOSIS — R87619 Unspecified abnormal cytological findings in specimens from cervix uteri: Secondary | ICD-10-CM | POA: Insufficient documentation

## 2022-01-20 ENCOUNTER — Encounter: Payer: BC Managed Care – PPO | Admitting: Adult Health

## 2022-01-22 ENCOUNTER — Telehealth: Payer: Self-pay | Admitting: Adult Health

## 2022-01-22 MED ORDER — NORETHIN ACE-ETH ESTRAD-FE 1-20 MG-MCG PO TABS: 1.0000 | ORAL_TABLET | Freq: Every day | ORAL | 11 refills | Status: DC

## 2022-01-22 NOTE — Telephone Encounter (Signed)
Pt wants to be put on birth control pills instead of Nexplanon. She wants the pill that she was on before but can't remember the name. She is not breastfeeding now.  I looked in her chart and I can't find it either. Please advise. Thanks! JSY

## 2022-01-22 NOTE — Telephone Encounter (Signed)
Patient has decided that she wants to be put back on the birth control pills she was on before her pregnancy. Do you want to see her first or will you call in the prescription? Please advise.

## 2022-01-22 NOTE — Addendum Note (Signed)
Addended by: Cyril Mourning A on: 01/22/2022 01:20 PM   Modules accepted: Orders

## 2022-01-22 NOTE — Telephone Encounter (Signed)
Caitlyn Green is wanting to start OCs, she denies any MI,strike, DVT, breast cancer or migraine with aura, will rx junel 1/20 has used in the past, start with next period and use condoms for 1 pack

## 2022-01-23 ENCOUNTER — Encounter: Payer: BC Managed Care – PPO | Admitting: Adult Health

## 2022-08-15 ENCOUNTER — Emergency Department (HOSPITAL_COMMUNITY)
Admission: EM | Admit: 2022-08-15 | Discharge: 2022-08-15 | Disposition: A | Discharge status: Home / Self Care | Payer: BC Managed Care – PPO | Attending: Emergency Medicine | Admitting: Emergency Medicine

## 2022-08-15 ENCOUNTER — Other Ambulatory Visit: Payer: Self-pay

## 2022-08-15 ENCOUNTER — Encounter (HOSPITAL_COMMUNITY): Payer: Self-pay

## 2022-08-15 DIAGNOSIS — R55 Syncope and collapse: Secondary | ICD-10-CM

## 2022-08-15 DIAGNOSIS — D649 Anemia, unspecified: Secondary | ICD-10-CM | POA: Diagnosis not present

## 2022-08-15 LAB — BASIC METABOLIC PANEL
Anion gap: 9 (ref 5–15)
BUN: 9 mg/dL (ref 6–20)
CO2: 24 mmol/L (ref 22–32)
Calcium: 8.6 mg/dL — ABNORMAL LOW (ref 8.9–10.3)
Chloride: 102 mmol/L (ref 98–111)
Creatinine, Ser: 0.75 mg/dL (ref 0.44–1.00)
GFR, Estimated: >60 mL/min (ref >60)
Glucose, Bld: 96 mg/dL (ref 70–99)
Potassium: 3.1 mmol/L — ABNORMAL LOW (ref 3.5–5.1)
Sodium: 135 mmol/L (ref 135–145)

## 2022-08-15 LAB — CBC WITH DIFFERENTIAL/PLATELET
Abs Immature Granulocytes: 0 10*3/uL (ref 0.00–0.07)
Basophils Absolute: 0 10*3/uL (ref 0.0–0.1)
Basophils Relative: 1 %
Eosinophils Absolute: 0.1 10*3/uL (ref 0.0–0.5)
Eosinophils Relative: 1 %
HCT: 34.5 % — ABNORMAL LOW (ref 36.0–46.0)
Hemoglobin: 11.1 g/dL — ABNORMAL LOW (ref 12.0–15.0)
Immature Granulocytes: 0 %
Lymphocytes Relative: 52 %
Lymphs Abs: 2.1 10*3/uL (ref 0.7–4.0)
MCH: 26.9 pg (ref 26.0–34.0)
MCHC: 32.2 g/dL (ref 30.0–36.0)
MCV: 83.7 fL (ref 80.0–100.0)
Monocytes Absolute: 0.6 10*3/uL (ref 0.1–1.0)
Monocytes Relative: 16 %
Neutro Abs: 1.2 10*3/uL — ABNORMAL LOW (ref 1.7–7.7)
Neutrophils Relative %: 30 %
Platelets: 263 10*3/uL (ref 150–400)
RBC: 4.12 MIL/uL (ref 3.87–5.11)
RDW: 14.7 % (ref 11.5–15.5)
WBC: 4 10*3/uL (ref 4.0–10.5)
nRBC: 0 % (ref 0.0–0.2)

## 2022-08-15 LAB — HCG, SERUM, QUALITATIVE: Preg, Serum: NEGATIVE

## 2022-08-15 MED ORDER — SODIUM CHLORIDE 0.9 % IV BOLUS
1000.0000 mL | Freq: Once | INTRAVENOUS | Status: AC
  Administered 2022-08-15: 1000 mL via INTRAVENOUS

## 2022-08-15 MED ORDER — DIPHENHYDRAMINE HCL 25 MG PO CAPS
25.0000 mg | ORAL_CAPSULE | Freq: Once | ORAL | Status: AC
  Administered 2022-08-15: 25 mg via ORAL
  Filled 2022-08-15: qty 1

## 2022-08-15 NOTE — Discharge Instructions (Signed)
Drink plenty of fluids and get plenty of rest.  Follow-up with primary doctor and return to the emergency department if you develop any new and/or concerning symptoms.

## 2022-08-15 NOTE — ED Provider Notes (Signed)
[facility] Provider Note   CSN: [id number] Arrival date & time: 08/15/22  [id number]     History  Chief Complaint  Patient presents with   Loss of Consciousness    Caitlyn Green is a 22 y.o. female.  Patient is a 22 year old female with past medical history of juvenile idiopathic arthritis.  Patient presenting today for evaluation of syncope.  Patient reports getting up off of the sofa to take a Benadryl due to congestion.  She stood up, then became dizzy and lightheaded, then apparently passed out and landed on the floor.  She denies having struck her head or lost control of her bowel or bladder.  She denies any headache or neck pain.  There is no reported seizure activity.  The history is provided by the patient.       Home Medications Prior to Admission medications   Medication Sig Start Date End Date Taking? Authorizing Provider  norethindrone-ethinyl estradiol-FE (LOESTRIN FE) 1-20 MG-MCG tablet Take 1 tablet by mouth daily. 01/22/22 01/22/23  Estill Dooms, NP      Allergies    Patient has no known allergies.    Review of Systems   Review of Systems  All other systems reviewed and are negative.   Physical Exam Updated Vital Signs BP 120/84   Pulse 89   Temp 99.4 F (37.4 C) (Oral)   Resp 18   Ht 5' 11"$  (1.803 m)   Wt 55.8 kg   SpO2 98%   BMI 17.16 kg/m  Physical Exam Vitals and nursing note reviewed.  Constitutional:      General: She is not in acute distress.    Appearance: She is well-developed. She is not diaphoretic.  HENT:     Head: Normocephalic and atraumatic.  Eyes:     Extraocular Movements: Extraocular movements intact.     Pupils: Pupils are equal, round, and reactive to light.  Cardiovascular:     Rate and Rhythm: Normal rate and regular rhythm.     Heart sounds: No murmur heard.    No friction rub. No gallop.  Pulmonary:     Effort: Pulmonary effort is normal. No respiratory distress.      Breath sounds: Normal breath sounds. No wheezing.  Abdominal:     General: Bowel sounds are normal. There is no distension.     Palpations: Abdomen is soft.     Tenderness: There is no abdominal tenderness.  Musculoskeletal:        General: Normal range of motion.     Cervical back: Normal range of motion and neck supple.  Skin:    General: Skin is warm and dry.  Neurological:     General: No focal deficit present.     Mental Status: She is alert and oriented to person, place, and time.     Cranial Nerves: No cranial nerve deficit.     Sensory: No sensory deficit.     Motor: No weakness.     Coordination: Coordination normal.     ED Results / Procedures / Treatments   Labs (all labs ordered are listed, but only abnormal results are displayed) Labs Reviewed  BASIC METABOLIC PANEL  CBC WITH DIFFERENTIAL/PLATELET  HCG, SERUM, QUALITATIVE    EKG None  Radiology No results found.  Procedures Procedures    Medications Ordered in ED Medications  sodium chloride 0.9 % bolus 1,000 mL (has no administration in time range)    ED Course/ Medical Decision Making/  A&P  Patient is a 22 year old female presenting with complaints of syncope.  She got up from the sofa to get a dose of Benadryl to help with her cough when she became lightheaded, dizzy, then passed out on the floor.  She called 911 when she woke up and was transported here.  Arrives here with stable vital signs.  She is awake and alert and neurologically intact.  Workup initiated including CBC, metabolic panel, both of which were unremarkable.  She was mildly anemic with a hemoglobin of 11.1, however this is actually higher than her baseline.  Patient hydrated with normal saline and given Benadryl as she requested for her cough.  Patient has been observed here for over 2 hours and seems back to baseline.  I suspect she had some sort of syncopal event when she stood that was likely vasovagal in nature.  I highly doubt  seizure activity given the description of the episode.   Final Clinical Impression(s) / ED Diagnoses Final diagnoses:  None    Rx / DC Orders ED Discharge Orders     None         Veryl Speak, MD 08/15/22 0630

## 2022-08-15 NOTE — ED Triage Notes (Signed)
Pt bib [facility], states she got up to take benedryl and fell herself fall and hit face then does not remember what happened, believes she was out about 63mn

## 2023-03-02 ENCOUNTER — Emergency Department (HOSPITAL_COMMUNITY): Payer: Medicaid Other

## 2023-03-02 ENCOUNTER — Other Ambulatory Visit: Payer: Self-pay

## 2023-03-02 ENCOUNTER — Emergency Department (HOSPITAL_COMMUNITY)
Admission: EM | Admit: 2023-03-02 | Discharge: 2023-03-02 | Discharge status: Against Medical Advice | Payer: Medicaid Other | Attending: Student | Admitting: Student

## 2023-03-02 ENCOUNTER — Encounter (HOSPITAL_COMMUNITY): Payer: Self-pay | Admitting: Emergency Medicine

## 2023-03-02 DIAGNOSIS — S99922A Unspecified injury of left foot, initial encounter: Secondary | ICD-10-CM | POA: Diagnosis present

## 2023-03-02 DIAGNOSIS — Z5321 Procedure and treatment not carried out due to patient leaving prior to being seen by health care provider: Secondary | ICD-10-CM | POA: Diagnosis not present

## 2023-03-02 DIAGNOSIS — W228XXA Striking against or struck by other objects, initial encounter: Secondary | ICD-10-CM | POA: Diagnosis not present

## 2023-03-02 NOTE — ED Triage Notes (Signed)
Pt reports stubbing left little toes this weekend. Pain continues. Hx of RA.

## 2023-05-26 ENCOUNTER — Emergency Department (HOSPITAL_COMMUNITY): Payer: Medicaid Other

## 2023-05-26 ENCOUNTER — Emergency Department (HOSPITAL_COMMUNITY)
Admission: EM | Admit: 2023-05-26 | Discharge: 2023-05-26 | Disposition: A | Discharge status: Home / Self Care | Payer: Medicaid Other | Attending: Emergency Medicine | Admitting: Emergency Medicine

## 2023-05-26 ENCOUNTER — Encounter (HOSPITAL_COMMUNITY): Payer: Self-pay | Admitting: *Deleted

## 2023-05-26 ENCOUNTER — Other Ambulatory Visit: Payer: Self-pay

## 2023-05-26 DIAGNOSIS — M25561 Pain in right knee: Secondary | ICD-10-CM | POA: Diagnosis present

## 2023-05-26 DIAGNOSIS — M1990 Unspecified osteoarthritis, unspecified site: Secondary | ICD-10-CM

## 2023-05-26 DIAGNOSIS — M25562 Pain in left knee: Secondary | ICD-10-CM | POA: Insufficient documentation

## 2023-05-26 LAB — BASIC METABOLIC PANEL
Anion gap: 7 (ref 5–15)
BUN: 12 mg/dL (ref 6–20)
CO2: 25 mmol/L (ref 22–32)
Calcium: 9 mg/dL (ref 8.9–10.3)
Chloride: 105 mmol/L (ref 98–111)
Creatinine, Ser: 0.84 mg/dL (ref 0.44–1.00)
GFR, Estimated: >60 mL/min (ref >60)
Glucose, Bld: 79 mg/dL (ref 70–99)
Potassium: 3.8 mmol/L (ref 3.5–5.1)
Sodium: 137 mmol/L (ref 135–145)

## 2023-05-26 LAB — CBC WITH DIFFERENTIAL/PLATELET
Abs Immature Granulocytes: 0.01 10*3/uL (ref 0.00–0.07)
Basophils Absolute: 0 10*3/uL (ref 0.0–0.1)
Basophils Relative: 1 %
Eosinophils Absolute: 0 10*3/uL (ref 0.0–0.5)
Eosinophils Relative: 1 %
HCT: 36.6 % (ref 36.0–46.0)
Hemoglobin: 11.5 g/dL — ABNORMAL LOW (ref 12.0–15.0)
Immature Granulocytes: 0 %
Lymphocytes Relative: 45 %
Lymphs Abs: 2.1 10*3/uL (ref 0.7–4.0)
MCH: 26.9 pg (ref 26.0–34.0)
MCHC: 31.4 g/dL (ref 30.0–36.0)
MCV: 85.5 fL (ref 80.0–100.0)
Monocytes Absolute: 0.3 10*3/uL (ref 0.1–1.0)
Monocytes Relative: 6 %
Neutro Abs: 2.1 10*3/uL (ref 1.7–7.7)
Neutrophils Relative %: 47 %
Platelets: 294 10*3/uL (ref 150–400)
RBC: 4.28 MIL/uL (ref 3.87–5.11)
RDW: 14.2 % (ref 11.5–15.5)
WBC: 4.6 10*3/uL (ref 4.0–10.5)
nRBC: 0 % (ref 0.0–0.2)

## 2023-05-26 NOTE — ED Provider Notes (Addendum)
Juvenile RA [place] - stopped recently Sees [facility] Labs stable at that time.  Here with persistent knee pain bilaterally Subjective swelling and bruising Physical Exam  BP 114/76 (BP Location: Right Arm)   Pulse 60   Temp 99 F (37.2 C) (Oral)   Resp 16   Ht 5\' 11"  (1.803 m)   Wt 56.7 kg   SpO2 100%   BMI 17.43 kg/m   Physical Exam  Procedures  Procedures  ED Course / MDM    Medical Decision Making Amount and/or Complexity of Data Reviewed Labs: ordered. Radiology: ordered.   Pending xray, labs Pred taper to bridge until she can get her meds  Xrays are unremarkable. Labs stable without evidence of infection. She is happy to take her prednisone, wait for her regular medications. No concern for acute process.      Elpidio Anis, PA-C 05/26/23 1915    Elpidio Anis, PA-C 05/26/23 2218    Royanne Foots, DO 05/29/23 [phone]

## 2023-05-26 NOTE — ED Provider Notes (Signed)
[place] EMERGENCY DEPARTMENT AT [facility] Provider Note   CSN: [id number] Arrival date & time: 05/26/23  1647     History  Chief Complaint  Patient presents with   Knee Pain    Caitlyn Green is a 22 y.o. female.   Knee Pain Associated symptoms: no back pain and no fever         Caitlyn Green is a 22 y.o. female with past medical history of juvenile RA who presents to the Emergency Department complaining of bilateral knee pain and bruising.  Symptoms present for several days.  Describes pain of her right knee greater than left.  No known injury.  She states she has had knee pain for some time pain is typically at the top of her knees and radiates to the back of both knees.  She was taking Rinvoq for her RA but stopped the medication due to difficulties obtaining it.  She was seen by a rheumatologist on 05/19/2023 had labs done without acute finding.  She was prescribed taper dose of prednisone until she can start back on her Rinvoq.  She feels her knees are slightly swollen, denies any fever, chills, pain of her hips redness or excessive warmth of the joints.  Knees are worse with weightbearing, squatting and going up and down steps. Denies pain to other joints     Home Medications Prior to Admission medications   Medication Sig Start Date End Date Taking? Authorizing Provider  norethindrone-ethinyl estradiol-FE (LOESTRIN FE) 1-20 MG-MCG tablet Take 1 tablet by mouth daily. 01/22/22 01/22/23  Adline Potter, NP      Allergies    Patient has no known allergies.    Review of Systems   Review of Systems  Constitutional:  Negative for appetite change, chills and fever.  Respiratory:  Negative for shortness of breath.   Cardiovascular:  Negative for chest pain.  Gastrointestinal:  Negative for abdominal pain, nausea and vomiting.  Musculoskeletal:  Positive for arthralgias (bilateral knee pain). Negative for back pain.  Skin:        Bruising bilateral  knees  Neurological:  Negative for dizziness, weakness and numbness.    Physical Exam Updated Vital Signs BP 114/76 (BP Location: Right Arm)   Pulse 60   Temp 99 F (37.2 C) (Oral)   Resp 16   Ht 5\' 11"  (1.803 m)   Wt 56.7 kg   SpO2 100%   BMI 17.43 kg/m  Physical Exam Vitals and nursing note reviewed.  Constitutional:      General: She is not in acute distress.    Appearance: Normal appearance. She is not ill-appearing or toxic-appearing.  Cardiovascular:     Rate and Rhythm: Normal rate and regular rhythm.     Pulses: Normal pulses.  Pulmonary:     Effort: Pulmonary effort is normal.  Abdominal:     Palpations: Abdomen is soft.     Tenderness: There is no abdominal tenderness.  Musculoskeletal:        General: Tenderness present. No swelling, deformity or signs of injury.     Right knee: No swelling, effusion, erythema or ecchymosis. Normal range of motion. Tenderness present. No patellar tendon tenderness.     Left knee: No swelling, effusion, erythema or ecchymosis. Normal range of motion. Tenderness present. No patellar tendon tenderness.     Right lower leg: No edema.     Left lower leg: No edema.     Comments: Ttp of the superior knee joint bilaterally.  No palpable edema, erythema or excessive warmth.  No crepitus on exam  Skin:    General: Skin is warm.     Capillary Refill: Capillary refill takes less than 2 seconds.     Findings: No bruising.     Comments: Slight discoloration superior right knee joint.  No obvious ecchymosis.    Neurological:     General: No focal deficit present.     Mental Status: She is alert.     Sensory: No sensory deficit.     Motor: No weakness.     ED Results / Procedures / Treatments   Labs (all labs ordered are listed, but only abnormal results are displayed) Labs Reviewed  CBC WITH DIFFERENTIAL/PLATELET  BASIC METABOLIC PANEL    EKG None  Radiology No results found.  Procedures Procedures    Medications Ordered  in ED Medications - No data to display  ED Course/ Medical Decision Making/ A&P                                 Medical Decision Making Patient with history of RA followed by rheumatology at [facility].  Was seen on 05/19/2023.  Had stopped taking Rinvoq and was prescribed taper dose of prednisone for presumptive RA flare until she could start back on the RInvoq.  Describes bruising on anterior knees. On exam there is a small area of discoloration to the superior right knee but does not appear c/w ecchymosis.    I suspect RA flare.  Fx, strain, septic joint considered but felt less likely  Amount and/or Complexity of Data Reviewed External Data Reviewed: notes.    Details: Prior medical records reviewed by me, seen by rheumatology at [facility] CRP, CBC, chemistries and sed rate on 05/19/2023, all within normal limits, reviewed by me Labs: ordered.    Details: Labs pending Radiology: ordered.    Details: XR of bilateral knees pending Discussion of management or test interpretation with external provider(s): Work up pending.  Discussed with Elpidio Anis, PA-C.  I suspect pt can be discharged home and to continue her Prednisone as directed.             Final Clinical Impression(s) / ED Diagnoses Final diagnoses:  Acute pain of both knees    Rx / DC Orders ED Discharge Orders     None         Pauline Aus, PA-C 05/26/23 1920    Royanne Foots, DO 05/29/23 [phone]

## 2023-05-26 NOTE — ED Triage Notes (Signed)
Pt has a diagnosis of RA and states she has pain and bruising to bilateral knees

## 2023-05-26 NOTE — Discharge Instructions (Signed)
Take your medications as planned. Return to the ED with any new or concerning symptoms at any time.

## 2023-07-08 NOTE — L&D Delivery Note (Signed)
 Delivery Note Caitlyn Green is a 23 y.o. G3P2002 at [redacted]w[redacted]d admitted for PROM.   GBS Status:  Negative/-- (12/01 1605)  Labor course: Initial SVE: 3/75/-2. Augmentation with: AROM and Pitocin . She then progressed to complete.  ROM: 17h 74m with clear fluid  Birth: After a 2 push 2nd stage, she delivered a Live born female  Birth Weight: 7 lb 2.3 oz (3240 g) APGAR: 7, 9  Newborn Delivery   Birth date/time: 06/13/2024 23:18:53 Delivery type: Vaginal, Spontaneous        Delivered via spontaneous vaginal delivery (Presentation: ROA ). Nuchal cord present: Yes. X3, loose and reduced. Shoulders and body delivered in usual fashion. Infant placed directly on mom's abdomen for bonding/skin-to-skin, baby dried and stimulated. Cord clamped x 2 after 1 minute and cut by FOB.  Cord blood collected. Placenta delivered-Spontaneous with 3 vessels. 20u Pitocin  in 500cc LR given as a bolus prior to delivery of placenta.  Fundus firm with massage. Placenta inspected and appears to be intact with a 3 VC.  Sponge and instrument count were correct x2.  Intrapartum complications:  None Anesthesia:  none Lacerations:  none Suture Repair: N/A EBL (mL):250  Newborn Data: Birth date:06/13/2024 Birth time:11:18 PM Gender:Female Living status:Living Apgars:7 ,9  Weight:3240 g    Mom to postpartum.  Baby to Couplet care / Skin to Skin. Placenta to L&D   Plans to Breast and bottlefeed Contraception: none Circumcision: wants inpatient  Note sent to [facility] for pp visit.  Delivery Report:  Review the Delivery Report for details.     Signed: Cathlean Ely, DNP,CNM 06/13/2024, 11:43 PM

## 2023-11-03 ENCOUNTER — Other Ambulatory Visit: Payer: Self-pay | Admitting: Adult Health

## 2023-11-03 ENCOUNTER — Encounter: Payer: Self-pay | Admitting: *Deleted

## 2023-11-03 ENCOUNTER — Ambulatory Visit: Admitting: *Deleted

## 2023-11-03 VITALS — BP 100/62 | HR 83 | Ht 71.0 in | Wt 119.0 lb

## 2023-11-03 DIAGNOSIS — Z3201 Encounter for pregnancy test, result positive: Secondary | ICD-10-CM | POA: Diagnosis not present

## 2023-11-03 LAB — POCT URINE PREGNANCY: Preg Test, Ur: POSITIVE — AB

## 2023-11-03 MED ORDER — ONDANSETRON HCL 4 MG PO TABS: 4.0000 mg | ORAL_TABLET | Freq: Three times a day (TID) | ORAL | 1 refills | Status: AC | PRN

## 2023-11-03 NOTE — Progress Notes (Signed)
   NURSE VISIT- PREGNANCY CONFIRMATION   SUBJECTIVE:  Caitlyn Green is a 23 y.o. G102P2002 female at [redacted]w[redacted]d by certain LMP of Patient's last menstrual period was 09/20/2023 (exact date). Here for pregnancy confirmation.  Home pregnancy test: positive x 1.   She reports  nausea & cramping .  She is not taking prenatal vitamins.    OBJECTIVE:  BP 100/62 (BP Location: Right Arm, Patient Position: Sitting, Cuff Size: Normal)   Pulse 83   Ht 5\' 11"  (1.803 m)   Wt 119 lb (54 kg)   LMP 09/20/2023 (Exact Date)   Breastfeeding No   BMI 16.60 kg/m   Appears well, in no apparent distress  Results for orders placed or performed in visit on 11/03/23 (from the past 24 hours)  POCT urine pregnancy   Collection Time: 11/03/23 10:11 AM  Result Value Ref Range   Preg Test, Ur Positive (A) Negative    ASSESSMENT: Positive pregnancy test, [redacted]w[redacted]d by LMP    PLAN: Schedule for dating ultrasound in 2 weeks Prenatal vitamins: plans to begin OTC ASAP   Nausea medicines: requested-note routed to [facility] to send prescription   OB packet given: Yes  Alphonso Aschoff  11/03/2023 10:11 AM

## 2023-11-03 NOTE — Progress Notes (Signed)
Rx sent in for zofran.   

## 2023-11-16 ENCOUNTER — Other Ambulatory Visit: Payer: Self-pay | Admitting: Obstetrics & Gynecology

## 2023-11-16 DIAGNOSIS — O3680X0 Pregnancy with inconclusive fetal viability, not applicable or unspecified: Secondary | ICD-10-CM

## 2023-11-17 ENCOUNTER — Other Ambulatory Visit: Admitting: Radiology

## 2023-11-23 ENCOUNTER — Ambulatory Visit

## 2023-11-23 DIAGNOSIS — O3680X0 Pregnancy with inconclusive fetal viability, not applicable or unspecified: Secondary | ICD-10-CM

## 2023-11-23 DIAGNOSIS — Z3A09 9 weeks gestation of pregnancy: Secondary | ICD-10-CM | POA: Diagnosis not present

## 2023-11-23 DIAGNOSIS — Z3491 Encounter for supervision of normal pregnancy, unspecified, first trimester: Secondary | ICD-10-CM | POA: Diagnosis not present

## 2023-11-23 NOTE — Progress Notes (Signed)
 US  9+1 wks,single IUP with yolk sac,FHR 166 bpm,normal ovaries,CRL 20.37 mm

## 2023-12-21 ENCOUNTER — Other Ambulatory Visit: Payer: Self-pay | Admitting: Obstetrics & Gynecology

## 2023-12-21 DIAGNOSIS — Z3682 Encounter for antenatal screening for nuchal translucency: Secondary | ICD-10-CM

## 2023-12-21 DIAGNOSIS — Z3480 Encounter for supervision of other normal pregnancy, unspecified trimester: Secondary | ICD-10-CM | POA: Insufficient documentation

## 2023-12-22 ENCOUNTER — Encounter: Admitting: *Deleted

## 2023-12-22 ENCOUNTER — Ambulatory Visit: Admitting: Women's Health

## 2023-12-22 ENCOUNTER — Encounter: Payer: Self-pay | Admitting: Women's Health

## 2023-12-22 ENCOUNTER — Other Ambulatory Visit (HOSPITAL_COMMUNITY)
Admission: RE | Admit: 2023-12-22 | Discharge: 2023-12-22 | Disposition: A | Discharge status: Home / Self Care | Source: Ambulatory Visit | Attending: Women's Health | Admitting: Women's Health

## 2023-12-22 ENCOUNTER — Ambulatory Visit

## 2023-12-22 VITALS — BP 91/63 | HR 82 | Wt 120.0 lb

## 2023-12-22 DIAGNOSIS — Z1332 Encounter for screening for maternal depression: Secondary | ICD-10-CM

## 2023-12-22 DIAGNOSIS — Z3A13 13 weeks gestation of pregnancy: Secondary | ICD-10-CM

## 2023-12-22 DIAGNOSIS — Z3481 Encounter for supervision of other normal pregnancy, first trimester: Secondary | ICD-10-CM

## 2023-12-22 DIAGNOSIS — Z3480 Encounter for supervision of other normal pregnancy, unspecified trimester: Secondary | ICD-10-CM

## 2023-12-22 DIAGNOSIS — M0880 Other juvenile arthritis, unspecified site: Secondary | ICD-10-CM

## 2023-12-22 DIAGNOSIS — Z3682 Encounter for antenatal screening for nuchal translucency: Secondary | ICD-10-CM

## 2023-12-22 DIAGNOSIS — Z124 Encounter for screening for malignant neoplasm of cervix: Secondary | ICD-10-CM | POA: Insufficient documentation

## 2023-12-22 MED ORDER — BLOOD PRESSURE CUFF MISC: 1.0000 | 0 refills | Status: DC

## 2023-12-22 NOTE — Progress Notes (Signed)
 US  13+2 wks,measurements c/w dates,CRL 72.31 mm,FHR 149 bpm,normal ovaries,NB present,NT 1.6 mm,anterior placenta

## 2023-12-22 NOTE — Progress Notes (Signed)
 INITIAL OBSTETRICAL VISIT Patient name: Caitlyn Green MRN [id number]  Date of birth: 2000-11-01 Chief Complaint:   Initial Prenatal Visit  History of Present Illness:   Caitlyn Green is a 23 y.o. G32P2002 African-American female at [redacted]w[redacted]d by LMP c/w u/s at 8 weeks with an Estimated Date of Delivery: 06/26/24 being seen today for her initial obstetrical visit.   Patient's last menstrual period was 09/20/2023 (exact date). Her obstetrical history is significant for SVB x 2, FGR w/ both.   JIA-on Rinvoq prior to pregnancy, stopped w/ +PT, plans to stay off meds during pregnancy Today she reports n/v much improved.  Last pap 01/08/22. Results were: LSIL w/ HRHPV positive: type not specified     12/22/2023   10:14 AM 09/17/2021   10:22 AM 05/29/2021   11:21 AM 04/30/2020    4:19 PM 03/13/2020    2:49 PM  Depression screen PHQ 2/9  Decreased Interest 1 2 3 1  0  Down, Depressed, Hopeless 0 1 2 1 1   PHQ - 2 Score 1 3 5 2 1   Altered sleeping 1 0 1 3 1   Tired, decreased energy 1 1 2 1 1   Change in appetite 1 0 2 1 0  Feeling bad or failure about yourself  0 1 2 0 1  Trouble concentrating 0 0 1 0 0  Moving slowly or fidgety/restless 0 0 0 0 0  Suicidal thoughts 0 0 0 0 0  PHQ-9 Score 4 5 13 7 4   Difficult doing work/chores     Not difficult at all        12/22/2023   10:15 AM 09/17/2021   10:22 AM 05/29/2021   11:22 AM 04/30/2020    4:19 PM  GAD 7 : Generalized Anxiety Score  Nervous, Anxious, on Edge 0 0 2 0  Control/stop worrying 0 0 2 0  Worry too much - different things 0 0 2 0  Trouble relaxing 0 0 2 0  Restless 0 0 1 0  Easily annoyed or irritable 1 2 3 2   Afraid - awful might happen 0 0 1 0  Total GAD 7 Score 1 2 13 2      Review of Systems:   Pertinent items are noted in HPI Denies cramping/contractions, leakage of fluid, vaginal bleeding, abnormal vaginal discharge w/ itching/odor/irritation, headaches, visual changes, shortness of breath, chest pain, abdominal pain,  severe nausea/vomiting, or problems with urination or bowel movements unless otherwise stated above.  Pertinent History Reviewed:  Reviewed past medical,surgical, social, obstetrical and family history.  Reviewed problem list, medications and allergies. OB History  Gravida Para Term Preterm AB Living  3 2 2  0 0 2  SAB IAB Ectopic Multiple Live Births  0 0 0 0 2    # Outcome Date GA Lbr Len/2nd Weight Sex Type Anes PTL Lv  3 Current           2 Term 11/28/21 [redacted]w[redacted]d 19:25 / 00:11 5 lb 15.2 oz (2.7 kg) F Vag-Spont EPI  LIV     Birth Comments: wnl  1 Term 10/25/20 [redacted]w[redacted]d  5 lb 9 oz (2.523 kg) M Vag-Spont EPI N LIV     Complications: IUGR (intrauterine growth restriction) affecting care of mother   Physical Assessment:   Vitals:   12/22/23 1008  BP: 91/63  Pulse: 82  Weight: 120 lb (54.4 kg)  Body mass index is 16.74 kg/m.       Physical Examination:  General appearance - well appearing, and  in no distress  Mental status - alert, oriented to person, place, and time  Psych:  She has a normal mood and affect  Skin - warm and dry, normal color, no suspicious lesions noted  Chest - effort normal, all lung fields clear to auscultation bilaterally  Heart - normal rate and regular rhythm  Abdomen - soft, nontender  Extremities:  No swelling or varicosities noted  Pelvic - VULVA: normal appearing vulva with no masses, tenderness or lesions  VAGINA: normal appearing vagina with normal color and discharge, no lesions  CERVIX: normal appearing cervix without discharge or lesions, no CMT  Thin prep pap is done w/ HR HPV cotesting  Chaperone: Peggy Dones  TODAY'S NT US  13+2 wks,measurements c/w dates,CRL 72.31 mm,FHR 149 bpm,normal ovaries,NB present,NT 1.6 mm,anterior placenta   No results found for this or any previous visit (from the past 24 hours).  Assessment & Plan:  1) Low-Risk Pregnancy G3P2002 at [redacted]w[redacted]d with an Estimated Date of Delivery: 06/26/24   2) Initial OB visit  3) JIA>  on Rinvoq prior to pregnancy, stopped w/ +PT, plans to stay off meds during pregnancy  4) H/O FGR x 2> plan serial u/s  Meds:  Meds ordered this encounter  Medications   Blood Pressure Monitoring (BLOOD PRESSURE CUFF) MISC    Sig: 1 kit by Does not apply route as directed.    Dispense:  1 each    Refill:  0    Initial labs obtained Continue prenatal vitamins Reviewed n/v relief measures and warning s/s to report Reviewed recommended weight gain based on pre-gravid BMI Encouraged well-balanced diet Genetic & carrier screening discussed: requests Panorama and NT/IT, Horizon prev preg Ultrasound discussed; fetal survey: requested CCNC completed> form faxed if has or is planning to apply for medicaid The nature of [employer] for [employer] with multiple MDs and other Advanced Practice Providers was explained to patient; also emphasized that fellows, residents, and students are part of our team. Does not have home bp cuff. Office bp cuff given: no. Rx sent: yes. Check bp weekly, let us  know if consistently >140/90.   Follow-up: Return in about 4 weeks (around 01/19/2024) for LROB, 2nd IT, CNM, in person; then 7wks from now anatomy u/s and LROB.   Orders Placed This Encounter  Procedures   Urine Culture   CBC/D/Plt+RPR+Rh+ABO+RubIgG...   HgB A1c   Integrated 1   PANORAMA PRENATAL TEST    Ferd Householder CNM, [facility] 12/22/2023 10:49 AM

## 2023-12-22 NOTE — Patient Instructions (Signed)
 Caitlyn Green, thank you for choosing our office today! We appreciate the opportunity to meet your healthcare needs. You may receive a short survey by mail, e-mail, or through [employer]. If you are happy with your care we would appreciate if you could take just a few minutes to complete the survey questions. We read all of your comments and take your feedback very seriously. Thank you again for choosing our office.  [facility] [employer] [facility] Team at [facility]  [facility] ([address] [place], [place] [address]) Entrance C, located off of [address] Free 24/7 valet parking   Nausea & Vomiting Have saltine crackers or pretzels by your bed and eat a few bites before you raise your head out of bed in the morning Eat small frequent meals throughout the day instead of large meals Drink plenty of fluids throughout the day to stay hydrated, just don't drink a lot of fluids with your meals.  This can make your stomach fill up faster making you feel sick Do not brush your teeth right after you eat Products with real ginger are good for nausea, like ginger ale and ginger hard candy Make sure it says made with real ginger! Sucking on sour candy like lemon heads is also good for nausea If your prenatal vitamins make you nauseated, take them at night so you will sleep through the nausea Sea Bands If you feel like you need medicine for the nausea & vomiting please let us  know If you are unable to keep any fluids or food down please let us  know   Constipation Drink plenty of fluid, preferably water, throughout the day Eat foods high in fiber such as fruits, vegetables, and grains Exercise, such as walking, is a good way to keep your bowels regular Drink warm fluids, especially warm prune juice, or decaf coffee Eat a 1/2 cup of real oatmeal (not instant), 1/2 cup applesauce, and 1/2-1 cup warm prune juice every day If needed, you may take Colace (docusate sodium ) stool  softener once or twice a day to help keep the stool soft.  If you still are having problems with constipation, you may take Miralax once daily as needed to help keep your bowels regular.   Home Blood Pressure Monitoring for Patients   Your provider has recommended that you check your blood pressure (BP) at least once a week at home. If you do not have a blood pressure cuff at home, one will be provided for you. Contact your provider if you have not received your monitor within 1 week.   Helpful Tips for Accurate Home Blood Pressure Checks  Don't smoke, exercise, or drink caffeine  30 minutes before checking your BP Use the restroom before checking your BP (a full bladder can raise your pressure) Relax in a comfortable upright chair Feet on the ground Left arm resting comfortably on a flat surface at the level of your heart Legs uncrossed Back supported Sit quietly and don't talk Place the cuff on your bare arm Adjust snuggly, so that only two fingertips can fit between your skin and the top of the cuff Check 2 readings separated by at least one minute Keep a log of your BP readings For a visual, please reference this diagram: http://ccnc.care/bpdiagram  Provider Name: [facility]     Phone: [phone]  Zone 1: ALL CLEAR  Continue to monitor your symptoms:  BP reading is less than 140 (top number) or less than 90 (bottom  number)  No right upper stomach pain No headaches or seeing spots No feeling nauseated or throwing up No swelling in face and hands  Zone 2: CAUTION Call your doctor's office for any of the following:  BP reading is greater than 140 (top number) or greater than 90 (bottom number)  Stomach pain under your ribs in the middle or right side Headaches or seeing spots Feeling nauseated or throwing up Swelling in face and hands  Zone 3: EMERGENCY  Seek immediate medical care if you have any of the following:  BP reading is greater than160 (top number) or  greater than 110 (bottom number) Severe headaches not improving with Tylenol  Serious difficulty catching your breath Any worsening symptoms from Zone 2    First Trimester of Pregnancy The first trimester of pregnancy is from week 1 until the end of week 12 (months 1 through 3). A week after a sperm fertilizes an egg, the egg will implant on the wall of the uterus. This embryo will begin to develop into a baby. Genes from you and your partner are forming the baby. The female genes determine whether the baby is a boy or a girl. At 6-8 weeks, the eyes and face are formed, and the heartbeat can be seen on ultrasound. At the end of 12 weeks, all the baby's organs are formed.  Now that you are pregnant, you will want to do everything you can to have a healthy baby. Two of the most important things are to get good prenatal care and to follow your health care provider's instructions. Prenatal care is all the medical care you receive before the baby's birth. This care will help prevent, find, and treat any problems during the pregnancy and childbirth. BODY CHANGES Your body goes through many changes during pregnancy. The changes vary from woman to woman.  You may gain or lose a couple of pounds at first. You may feel sick to your stomach (nauseous) and throw up (vomit). If the vomiting is uncontrollable, call your health care provider. You may tire easily. You may develop headaches that can be relieved by medicines approved by your health care provider. You may urinate more often. Painful urination may mean you have a bladder infection. You may develop heartburn as a result of your pregnancy. You may develop constipation because certain hormones are causing the muscles that push waste through your intestines to slow down. You may develop hemorrhoids or swollen, bulging veins (varicose veins). Your breasts may begin to grow larger and become tender. Your nipples may stick out more, and the tissue that  surrounds them (areola) may become darker. Your gums may bleed and may be sensitive to brushing and flossing. Dark spots or blotches (chloasma, mask of pregnancy) may develop on your face. This will likely fade after the baby is born. Your menstrual periods will stop. You may have a loss of appetite. You may develop cravings for certain kinds of food. You may have changes in your emotions from day to day, such as being excited to be pregnant or being concerned that something may go wrong with the pregnancy and baby. You may have more vivid and strange dreams. You may have changes in your hair. These can include thickening of your hair, rapid growth, and changes in texture. Some women also have hair loss during or after pregnancy, or hair that feels dry or thin. Your hair will most likely return to normal after your baby is born. WHAT TO EXPECT AT YOUR PRENATAL  VISITS During a routine prenatal visit: You will be weighed to make sure you and the baby are growing normally. Your blood pressure will be taken. Your abdomen will be measured to track your baby's growth. The fetal heartbeat will be listened to starting around week 10 or 12 of your pregnancy. Test results from any previous visits will be discussed. Your health care provider may ask you: How you are feeling. If you are feeling the baby move. If you have had any abnormal symptoms, such as leaking fluid, bleeding, severe headaches, or abdominal cramping. If you have any questions. Other tests that may be performed during your first trimester include: Blood tests to find your blood type and to check for the presence of any previous infections. They will also be used to check for low iron  levels (anemia) and Rh antibodies. Later in the pregnancy, blood tests for diabetes will be done along with other tests if problems develop. Urine tests to check for infections, diabetes, or protein in the urine. An ultrasound to confirm the proper growth  and development of the baby. An amniocentesis to check for possible genetic problems. Fetal screens for spina bifida and Down syndrome. You may need other tests to make sure you and the baby are doing well. HOME CARE INSTRUCTIONS  Medicines Follow your health care provider's instructions regarding medicine use. Specific medicines may be either safe or unsafe to take during pregnancy. Take your prenatal vitamins as directed. If you develop constipation, try taking a stool softener if your health care provider approves. Diet Eat regular, well-balanced meals. Choose a variety of foods, such as meat or vegetable-based protein, fish, milk and low-fat dairy products, vegetables, fruits, and whole grain breads and cereals. Your health care provider will help you determine the amount of weight gain that is right for you. Avoid raw meat and uncooked cheese. These carry germs that can cause birth defects in the baby. Eating four or five small meals rather than three large meals a day may help relieve nausea and vomiting. If you start to feel nauseous, eating a few soda crackers can be helpful. Drinking liquids between meals instead of during meals also seems to help nausea and vomiting. If you develop constipation, eat more high-fiber foods, such as fresh vegetables or fruit and whole grains. Drink enough fluids to keep your urine clear or pale yellow. Activity and Exercise Exercise only as directed by your health care provider. Exercising will help you: Control your weight. Stay in shape. Be prepared for labor and delivery. Experiencing pain or cramping in the lower abdomen or low back is a good sign that you should stop exercising. Check with your health care provider before continuing normal exercises. Try to avoid standing for long periods of time. Move your legs often if you must stand in one place for a long time. Avoid heavy lifting. Wear low-heeled shoes, and practice good posture. You may  continue to have sex unless your health care provider directs you otherwise. Relief of Pain or Discomfort Wear a good support bra for breast tenderness.   Take warm sitz baths to soothe any pain or discomfort caused by hemorrhoids. Use hemorrhoid cream if your health care provider approves.   Rest with your legs elevated if you have leg cramps or low back pain. If you develop varicose veins in your legs, wear support hose. Elevate your feet for 15 minutes, 3-4 times a day. Limit salt in your diet. Prenatal Care Schedule your prenatal visits by the  twelfth week of pregnancy. They are usually scheduled monthly at first, then more often in the last 2 months before delivery. Write down your questions. Take them to your prenatal visits. Keep all your prenatal visits as directed by your health care provider. Safety Wear your seat belt at all times when driving. Make a list of emergency phone numbers, including numbers for family, friends, the hospital, and police and fire departments. General Tips Ask your health care provider for a referral to a local prenatal education class. Begin classes no later than at the beginning of month 6 of your pregnancy. Ask for help if you have counseling or nutritional needs during pregnancy. Your health care provider can offer advice or refer you to specialists for help with various needs. Do not use hot tubs, steam rooms, or saunas. Do not douche or use tampons or scented sanitary pads. Do not cross your legs for long periods of time. Avoid cat litter boxes and soil used by cats. These carry germs that can cause birth defects in the baby and possibly loss of the fetus by miscarriage or stillbirth. Avoid all smoking, herbs, alcohol, and medicines not prescribed by your health care provider. Chemicals in these affect the formation and growth of the baby. Schedule a dentist appointment. At home, brush your teeth with a soft toothbrush and be gentle when you floss. SEEK  MEDICAL CARE IF:  You have dizziness. You have mild pelvic cramps, pelvic pressure, or nagging pain in the abdominal area. You have persistent nausea, vomiting, or diarrhea. You have a bad smelling vaginal discharge. You have pain with urination. You notice increased swelling in your face, hands, legs, or ankles. SEEK IMMEDIATE MEDICAL CARE IF:  You have a fever. You are leaking fluid from your vagina. You have spotting or bleeding from your vagina. You have severe abdominal cramping or pain. You have rapid weight gain or loss. You vomit blood or material that looks like coffee grounds. You are exposed to [place] measles and have never had them. You are exposed to fifth disease or chickenpox. You develop a severe headache. You have shortness of breath. You have any kind of trauma, such as from a fall or a car accident. Document Released: 06/17/2001 Document Revised: 11/07/2013 Document Reviewed: 05/03/2013 [facility] Patient Information 2015 [place], [place]. This information is not intended to replace advice given to you by your health care provider. Make sure you discuss any questions you have with your health care provider.

## 2023-12-23 ENCOUNTER — Ambulatory Visit: Payer: Self-pay | Admitting: Women's Health

## 2023-12-23 DIAGNOSIS — Z3480 Encounter for supervision of other normal pregnancy, unspecified trimester: Secondary | ICD-10-CM

## 2023-12-23 LAB — INTEGRATED 1

## 2023-12-23 MED ORDER — FERROUS SULFATE 325 (65 FE) MG PO TABS: 325.0000 mg | ORAL_TABLET | ORAL | 2 refills | Status: AC

## 2023-12-24 LAB — CBC/D/PLT+RPR+RH+ABO+RUBIGG...
Antibody Screen: NEGATIVE
Basophils Absolute: 0 10*3/uL (ref 0.0–0.2)
Basos: 0 %
EOS (ABSOLUTE): 0.1 10*3/uL (ref 0.0–0.4)
Eos: 2 %
HCV Ab: NONREACTIVE
HIV Screen 4th Generation wRfx: NONREACTIVE
Hematocrit: 31.5 % — ABNORMAL LOW (ref 34.0–46.6)
Hemoglobin: 10 g/dL — ABNORMAL LOW (ref 11.1–15.9)
Hepatitis B Surface Ag: NEGATIVE
Immature Grans (Abs): 0 10*3/uL (ref 0.0–0.1)
Immature Granulocytes: 0 %
Lymphocytes Absolute: 1.5 10*3/uL (ref 0.7–3.1)
Lymphs: 32 %
MCH: 26.5 pg — ABNORMAL LOW (ref 26.6–33.0)
MCHC: 31.7 g/dL (ref 31.5–35.7)
MCV: 84 fL (ref 79–97)
Monocytes Absolute: 0.3 10*3/uL (ref 0.1–0.9)
Monocytes: 6 %
Neutrophils Absolute: 2.9 10*3/uL (ref 1.4–7.0)
Neutrophils: 60 %
Platelets: 314 10*3/uL (ref 150–450)
RBC: 3.77 x10E6/uL (ref 3.77–5.28)
RDW: 14 % (ref 11.7–15.4)
RPR Ser Ql: NONREACTIVE
Rh Factor: POSITIVE
Rubella Antibodies, IGG: 2.6 {index} (ref >0.99)
WBC: 4.8 10*3/uL (ref 3.4–10.8)

## 2023-12-24 LAB — CYTOLOGY - PAP
Comment: NEGATIVE
Diagnosis: UNDETERMINED — AB
High risk HPV: NEGATIVE

## 2023-12-24 LAB — URINE CULTURE

## 2023-12-24 LAB — HEMOGLOBIN A1C
Est. average glucose Bld gHb Est-mCnc: 94 mg/dL
Hgb A1c MFr Bld: 4.9 % (ref 4.8–5.6)

## 2023-12-24 LAB — HCV INTERPRETATION

## 2023-12-24 LAB — INTEGRATED 1
Crown Rump Length: 72.3 mm
Gest. Age on Collection Date: 13.1 wk
Maternal Age at EDD: 23.6 a
Number of Fetuses: 1
PAPP-A Value: 2555.8 ng/mL
Sonographer ID#: 309760
Weight: 120 [lb_av]
Weight: 1.6 mm

## 2023-12-28 MED ORDER — METRONIDAZOLE 500 MG PO TABS: 500.0000 mg | ORAL_TABLET | Freq: Two times a day (BID) | ORAL | 0 refills | Status: DC

## 2023-12-29 LAB — PANORAMA PRENATAL TEST FULL PANEL:PANORAMA TEST PLUS 5 ADDITIONAL MICRODELETIONS: FETAL FRACTION: 6.6

## 2024-01-11 ENCOUNTER — Other Ambulatory Visit: Payer: Self-pay | Admitting: Women's Health

## 2024-01-11 MED ORDER — METRONIDAZOLE 500 MG PO TABS: 500.0000 mg | ORAL_TABLET | Freq: Two times a day (BID) | ORAL | 0 refills | Status: DC

## 2024-01-19 ENCOUNTER — Encounter: Payer: Self-pay | Admitting: Women's Health

## 2024-01-19 ENCOUNTER — Ambulatory Visit (INDEPENDENT_AMBULATORY_CARE_PROVIDER_SITE_OTHER): Admitting: Women's Health

## 2024-01-19 VITALS — BP 97/62 | HR 76 | Wt 128.0 lb

## 2024-01-19 DIAGNOSIS — Z3A17 17 weeks gestation of pregnancy: Secondary | ICD-10-CM | POA: Diagnosis not present

## 2024-01-19 DIAGNOSIS — Z3482 Encounter for supervision of other normal pregnancy, second trimester: Secondary | ICD-10-CM

## 2024-01-19 DIAGNOSIS — Z1379 Encounter for other screening for genetic and chromosomal anomalies: Secondary | ICD-10-CM

## 2024-01-19 DIAGNOSIS — Z363 Encounter for antenatal screening for malformations: Secondary | ICD-10-CM

## 2024-01-19 DIAGNOSIS — Z113 Encounter for screening for infections with a predominantly sexual mode of transmission: Secondary | ICD-10-CM

## 2024-01-19 DIAGNOSIS — Z3480 Encounter for supervision of other normal pregnancy, unspecified trimester: Secondary | ICD-10-CM

## 2024-01-19 NOTE — Patient Instructions (Signed)
Amour, thank you for choosing our office today! We appreciate the opportunity to meet your healthcare needs. You may receive a short survey by mail, e-mail, or through [employer]. If you are happy with your care we would appreciate if you could take just a few minutes to complete the survey questions. We read all of your comments and take your feedback very seriously. Thank you again for choosing our office.  Center for [employer] Team at [facility] at [facility] ([address] [place], [place] [address]) Entrance C, located off of [address] Free 24/7 valet parking  Go to [email or web address] to register for FREE online childbirth classes  Call the office [phone]) or go to [facility] if: You begin to severe cramping Your water breaks.  Sometimes it is a big gush of fluid, sometimes it is just a trickle that keeps getting your panties wet or running down your legs You have vaginal bleeding.  It is normal to have a small amount of spotting if your cervix was checked.   [facility] Pediatricians/Family Doctors [facility]): [address] Colette Ribas, [phone]           [facility]: [address], [phone]                [facility]): [address], [phone] (call to ask if accepting patients) [facility] Department: [address], [place], [phone]    [facility] Pediatricians/Family Doctors [facility]): [phone] [address], [phone] [facility]: [address] [place], [phone] [facility]: [address], [phone]  [facility] Doctors  [facility]): [phone] [facility]: [address] [phone]   [facility] Doctors [facility]: [address] [phone]  [facility] Doctors  [facility]: [phone], [phone]  Home Blood Pressure Monitoring for Patients   Your provider has recommended that you check your blood pressure (BP) at least once a week at home. If you do not have a blood pressure cuff at home, one will be provided for you. Contact your provider if you have not received your monitor within 1 week.   Helpful Tips for Accurate Home Blood Pressure Checks  Don't smoke, exercise, or drink caffeine 30 minutes before checking your BP Use the restroom before checking your BP (a full bladder can raise your pressure) Relax in a comfortable upright chair Feet on the ground Left arm resting comfortably on a flat surface at the level of your heart Legs uncrossed Back supported Sit quietly and don't talk Place the cuff on your bare arm Adjust snuggly, so that only two fingertips can fit between your skin and the top of the cuff Check 2 readings separated by at least one minute Keep a log of your BP readings For a visual, please reference this diagram: http://ccnc.care/bpdiagram  Provider Name: [facility]     Phone: [phone]  Zone 1: ALL CLEAR  Continue to monitor your symptoms:  BP reading is less than 140 (top number) or less than 90 (bottom number)  No right upper stomach pain No headaches or seeing spots No feeling nauseated or throwing up No swelling in face and hands  Zone 2: CAUTION Call your doctor's office for any of the following:  BP reading is greater than 140 (top number) or greater than  90 (bottom number)  Stomach pain under your ribs in the middle or right side Headaches or seeing spots Feeling nauseated or throwing up Swelling in face and hands  Zone 3: EMERGENCY  Seek immediate medical care if you have any of the following:  BP reading is greater than160 (top number) or greater than 110 (bottom number) Severe headaches not improving with Tylenol Serious difficulty catching your breath Any worsening symptoms from  Zone 2     Second Trimester of Pregnancy The second trimester is from week 14 through week 27 (months 4 through 6). The second trimester is often a time when you feel your best. Your body has adjusted to being pregnant, and you begin to feel better physically. Usually, morning sickness has lessened or quit completely, you may have more energy, and you may have an increase in appetite. The second trimester is also a time when the fetus is growing rapidly. At the end of the sixth month, the fetus is about 9 inches long and weighs about 1 pounds. You will likely begin to feel the baby move (quickening) between 16 and 20 weeks of pregnancy. Body changes during your second trimester Your body continues to go through many changes during your second trimester. The changes vary from woman to woman. Your weight will continue to increase. You will notice your lower abdomen bulging out. You may begin to get stretch marks on your hips, abdomen, and breasts. You may develop headaches that can be relieved by medicines. The medicines should be approved by your health care provider. You may urinate more often because the fetus is pressing on your bladder. You may develop or continue to have heartburn as a result of your pregnancy. You may develop constipation because certain hormones are causing the muscles that push waste through your intestines to slow down. You may develop hemorrhoids or swollen, bulging veins (varicose veins). You may have back pain. This is caused by: Weight gain. Pregnancy hormones that are relaxing the joints in your pelvis. A shift in weight and the muscles that support your balance. Your breasts will continue to grow and they will continue to become tender. Your gums may bleed and may be sensitive to brushing and flossing. Dark spots or blotches (chloasma, mask of pregnancy) may develop on your face. This will likely fade after the baby is born. A dark line from your belly button to  the pubic area (linea nigra) may appear. This will likely fade after the baby is born. You may have changes in your hair. These can include thickening of your hair, rapid growth, and changes in texture. Some women also have hair loss during or after pregnancy, or hair that feels dry or thin. Your hair will most likely return to normal after your baby is born.  What to expect at prenatal visits During a routine prenatal visit: You will be weighed to make sure you and the fetus are growing normally. Your blood pressure will be taken. Your abdomen will be measured to track your baby's growth. The fetal heartbeat will be listened to. Any test results from the previous visit will be discussed.  Your health care provider may ask you: How you are feeling. If you are feeling the baby move. If you have had any abnormal symptoms, such as leaking fluid, bleeding, severe headaches, or abdominal cramping. If you are using any tobacco products, including cigarettes, chewing tobacco, and electronic cigarettes. If you have any questions.  Other tests that may be performed during   your second trimester include: Blood tests that check for: Low iron levels (anemia). High blood sugar that affects pregnant women (gestational diabetes) between 24 and 28 weeks. Rh antibodies. This is to check for a protein on red blood cells (Rh factor). Urine tests to check for infections, diabetes, or protein in the urine. An ultrasound to confirm the proper growth and development of the baby. An amniocentesis to check for possible genetic problems. Fetal screens for spina bifida and Down syndrome. HIV (human immunodeficiency virus) testing. Routine prenatal testing includes screening for HIV, unless you choose not to have this test.  Follow these instructions at home: Medicines Follow your health care provider's instructions regarding medicine use. Specific medicines may be either safe or unsafe to take during  pregnancy. Take a prenatal vitamin that contains at least 600 micrograms (mcg) of folic acid. If you develop constipation, try taking a stool softener if your health care provider approves. Eating and drinking Eat a balanced diet that includes fresh fruits and vegetables, whole grains, good sources of protein such as meat, eggs, or tofu, and low-fat dairy. Your health care provider will help you determine the amount of weight gain that is right for you. Avoid raw meat and uncooked cheese. These carry germs that can cause birth defects in the baby. If you have low calcium intake from food, talk to your health care provider about whether you should take a daily calcium supplement. Limit foods that are high in fat and processed sugars, such as fried and sweet foods. To prevent constipation: Drink enough fluid to keep your urine clear or pale yellow. Eat foods that are high in fiber, such as fresh fruits and vegetables, whole grains, and beans. Activity Exercise only as directed by your health care provider. Most women can continue their usual exercise routine during pregnancy. Try to exercise for 30 minutes at least 5 days a week. Stop exercising if you experience uterine contractions. Avoid heavy lifting, wear low heel shoes, and practice good posture. A sexual relationship may be continued unless your health care provider directs you otherwise. Relieving pain and discomfort Wear a good support bra to prevent discomfort from breast tenderness. Take warm sitz baths to soothe any pain or discomfort caused by hemorrhoids. Use hemorrhoid cream if your health care provider approves. Rest with your legs elevated if you have leg cramps or low back pain. If you develop varicose veins, wear support hose. Elevate your feet for 15 minutes, 3-4 times a day. Limit salt in your diet. Prenatal Care Write down your questions. Take them to your prenatal visits. Keep all your prenatal visits as told by your health  care provider. This is important. Safety Wear your seat belt at all times when driving. Make a list of emergency phone numbers, including numbers for family, friends, the hospital, and police and fire departments. General instructions Ask your health care provider for a referral to a local prenatal education class. Begin classes no later than the beginning of month 6 of your pregnancy. Ask for help if you have counseling or nutritional needs during pregnancy. Your health care provider can offer advice or refer you to specialists for help with various needs. Do not use hot tubs, steam rooms, or saunas. Do not douche or use tampons or scented sanitary pads. Do not cross your legs for long periods of time. Avoid cat litter boxes and soil used by cats. These carry germs that can cause birth defects in the baby and possibly loss of the   fetus by miscarriage or stillbirth. Avoid all smoking, herbs, alcohol, and unprescribed drugs. Chemicals in these products can affect the formation and growth of the baby. Do not use any products that contain nicotine or tobacco, such as cigarettes and e-cigarettes. If you need help quitting, ask your health care provider. Visit your dentist if you have not gone yet during your pregnancy. Use a soft toothbrush to brush your teeth and be gentle when you floss. Contact a health care provider if: You have dizziness. You have mild pelvic cramps, pelvic pressure, or nagging pain in the abdominal area. You have persistent nausea, vomiting, or diarrhea. You have a bad smelling vaginal discharge. You have pain when you urinate. Get help right away if: You have a fever. You are leaking fluid from your vagina. You have spotting or bleeding from your vagina. You have severe abdominal cramping or pain. You have rapid weight gain or weight loss. You have shortness of breath with chest pain. You notice sudden or extreme swelling of your face, hands, ankles, feet, or legs. You  have not felt your baby move in over an hour. You have severe headaches that do not go away when you take medicine. You have vision changes. Summary The second trimester is from week 14 through week 27 (months 4 through 6). It is also a time when the fetus is growing rapidly. Your body goes through many changes during pregnancy. The changes vary from woman to woman. Avoid all smoking, herbs, alcohol, and unprescribed drugs. These chemicals affect the formation and growth your baby. Do not use any tobacco products, such as cigarettes, chewing tobacco, and e-cigarettes. If you need help quitting, ask your health care provider. Contact your health care provider if you have any questions. Keep all prenatal visits as told by your health care provider. This is important. This information is not intended to replace advice given to you by your health care provider. Make sure you discuss any questions you have with your health care provider. Document Released: 06/17/2001 Document Revised: 11/29/2015 Document Reviewed: 08/24/2012 Elsevier Interactive Patient Education  2017 [employer]vier Inc.  

## 2024-01-19 NOTE — Progress Notes (Signed)
97

## 2024-01-19 NOTE — Progress Notes (Signed)
 LOW-RISK PREGNANCY VISIT Patient name: Caitlyn Green MRN [id number]  Date of birth: Nov 13, 2000 Chief Complaint:   Routine Prenatal Visit (2nd IT. Need refill iron  pill)  History of Present Illness:   Caitlyn Green is a 23 y.o. G61P2002 female at [redacted]w[redacted]d with an Estimated Date of Delivery: 06/26/24 being seen today for ongoing management of a low-risk pregnancy.   Today she reports no complaints. Contractions: Not present.  .  Movement: Absent. denies leaking of fluid.     12/22/2023   10:14 AM 09/17/2021   10:22 AM 05/29/2021   11:21 AM 04/30/2020    4:19 PM 03/13/2020    2:49 PM  Depression screen PHQ 2/9  Decreased Interest 1 2 3 1  0  Down, Depressed, Hopeless 0 1 2 1 1   PHQ - 2 Score 1 3 5 2 1   Altered sleeping 1 0 1 3 1   Tired, decreased energy 1 1 2 1 1   Change in appetite 1 0 2 1 0  Feeling bad or failure about yourself  0 1 2 0 1  Trouble concentrating 0 0 1 0 0  Moving slowly or fidgety/restless 0 0 0 0 0  Suicidal thoughts 0 0 0 0 0  PHQ-9 Score 4 5 13 7 4   Difficult doing work/chores     Not difficult at all        12/22/2023   10:15 AM 09/17/2021   10:22 AM 05/29/2021   11:22 AM 04/30/2020    4:19 PM  GAD 7 : Generalized Anxiety Score  Nervous, Anxious, on Edge 0 0 2 0  Control/stop worrying 0 0 2 0  Worry too much - different things 0 0 2 0  Trouble relaxing 0 0 2 0  Restless 0 0 1 0  Easily annoyed or irritable 1 2 3 2   Afraid - awful might happen 0 0 1 0  Total GAD 7 Score 1 2 13 2       Review of Systems:   Pertinent items are noted in HPI Denies abnormal vaginal discharge w/ itching/odor/irritation, headaches, visual changes, shortness of breath, chest pain, abdominal pain, severe nausea/vomiting, or problems with urination or bowel movements unless otherwise stated above. Pertinent History Reviewed:  Reviewed past medical,surgical, social, obstetrical and family history.  Reviewed problem list, medications and allergies. Physical Assessment:    Vitals:   01/19/24 1354  BP: 97/62  Pulse: 76  Weight: 128 lb (58.1 kg)  Body mass index is 17.85 kg/m.        Physical Examination:   General appearance: Well appearing, and in no distress  Mental status: Alert, oriented to person, place, and time  Skin: Warm & dry  Cardiovascular: Normal heart rate noted  Respiratory: Normal respiratory effort, no distress  Abdomen: Soft, gravid, nontender  Pelvic: Cervical exam deferred         Extremities: Edema: None  Fetal Status: Fetal Heart Rate (bpm): 151   Movement: Absent    Chaperone: N/A No results found for this or any previous visit (from the past 24 hours).  Assessment & Plan:  1) Low-risk pregnancy G3P2002 at [redacted]w[redacted]d with an Estimated Date of Delivery: 06/26/24   2) H/O fGR x 2, serial efw u/s  3) JIA- no meds  4) STD screen> gc/ct today, wasn't done on pap   Meds: No orders of the defined types were placed in this encounter.  Labs/procedures today: GC/CT and 2nd IT  Plan:  Continue routine obstetrical care  Next visit: prefers  will be in person for u/s    Reviewed: Preterm labor symptoms and general obstetric precautions including but not limited to vaginal bleeding, contractions, leaking of fluid and fetal movement were reviewed in detail with the patient.  All questions were answered. Does have home bp cuff. Office bp cuff given: not applicable. Check bp weekly, let us  know if consistently >140 and/or >90.  Follow-up: Return for As scheduled.  Future Appointments  Date Time Provider Department Center  02/09/2024  2:15 PM [facility] US  [facility] None  02/09/2024  3:10 PM [name], FNP [facility]    Orders Placed This Encounter  Procedures   GC/Chlamydia Probe Amp   US  OB Comp + 14 Wk   INTEGRATED 2   [name] CNM, [facility] 01/19/2024 2:15 PM

## 2024-01-21 ENCOUNTER — Ambulatory Visit: Payer: Self-pay | Admitting: Women's Health

## 2024-01-21 DIAGNOSIS — Z3480 Encounter for supervision of other normal pregnancy, unspecified trimester: Secondary | ICD-10-CM

## 2024-01-21 LAB — INTEGRATED 2
AFP MoM: 0.86
Alpha-Fetoprotein: 39.2 ng/mL
Crown Rump Length: 72.3 mm
DIA MoM: 0.55
DIA Value: 100.4 pg/mL
Estriol, Unconjugated: 1.67 ng/mL
Gest. Age on Collection Date: 13.1 wk
Gestational Age: 17.1 wk
Maternal Age at EDD: 23.6 a
Nuchal Translucency (NT): 1.6 mm
Nuchal Translucency MoM: 0.81
Number of Fetuses: 1
PAPP-A MoM: 1.95
PAPP-A Value: 2555.8 ng/mL
Sonographer ID#: 309760
Test Results:: NEGATIVE
Weight: 120 [lb_av]
Weight: 120 [lb_av]
hCG MoM: 0.31
hCG Value: 10.3 [IU]/mL
uE3 MoM: 1.21

## 2024-01-21 LAB — GC/CHLAMYDIA PROBE AMP
Chlamydia trachomatis, NAA: NEGATIVE
Neisseria Gonorrhoeae by PCR: NEGATIVE

## 2024-01-24 ENCOUNTER — Other Ambulatory Visit: Payer: Self-pay

## 2024-01-24 ENCOUNTER — Encounter (HOSPITAL_COMMUNITY): Payer: Self-pay | Admitting: Emergency Medicine

## 2024-01-24 ENCOUNTER — Emergency Department (HOSPITAL_COMMUNITY)

## 2024-01-24 ENCOUNTER — Emergency Department (HOSPITAL_COMMUNITY)
Admission: EM | Admit: 2024-01-24 | Discharge: 2024-01-25 | Disposition: A | Discharge status: Home / Self Care | Attending: Emergency Medicine | Admitting: Emergency Medicine

## 2024-01-24 DIAGNOSIS — O26892 Other specified pregnancy related conditions, second trimester: Secondary | ICD-10-CM | POA: Diagnosis present

## 2024-01-24 DIAGNOSIS — Z3A18 18 weeks gestation of pregnancy: Secondary | ICD-10-CM | POA: Insufficient documentation

## 2024-01-24 DIAGNOSIS — R079 Chest pain, unspecified: Secondary | ICD-10-CM | POA: Diagnosis not present

## 2024-01-24 DIAGNOSIS — R051 Acute cough: Secondary | ICD-10-CM | POA: Insufficient documentation

## 2024-01-24 DIAGNOSIS — O99891 Other specified diseases and conditions complicating pregnancy: Secondary | ICD-10-CM | POA: Insufficient documentation

## 2024-01-24 MED ORDER — ACETAMINOPHEN 500 MG PO TABS
1000.0000 mg | ORAL_TABLET | Freq: Once | ORAL | Status: AC
  Administered 2024-01-25: 1000 mg via ORAL
  Filled 2024-01-24: qty 2

## 2024-01-24 NOTE — ED Triage Notes (Signed)
 Pt c/o cough, chest pain, and runny nose that started earlier today.

## 2024-01-25 ENCOUNTER — Emergency Department (HOSPITAL_COMMUNITY)

## 2024-01-25 LAB — BASIC METABOLIC PANEL WITH GFR
Anion gap: 10 (ref 5–15)
BUN: 7 mg/dL (ref 6–20)
CO2: 21 mmol/L — ABNORMAL LOW (ref 22–32)
Calcium: 8.2 mg/dL — ABNORMAL LOW (ref 8.9–10.3)
Chloride: 102 mmol/L (ref 98–111)
Creatinine, Ser: 0.56 mg/dL (ref 0.44–1.00)
GFR, Estimated: >60 mL/min (ref >60)
Glucose, Bld: 151 mg/dL — ABNORMAL HIGH (ref 70–99)
Potassium: 3.1 mmol/L — ABNORMAL LOW (ref 3.5–5.1)
Sodium: 133 mmol/L — ABNORMAL LOW (ref 135–145)

## 2024-01-25 LAB — CBC
HCT: 29.3 % — ABNORMAL LOW (ref 36.0–46.0)
Hemoglobin: 9.5 g/dL — ABNORMAL LOW (ref 12.0–15.0)
MCH: 27.9 pg (ref 26.0–34.0)
MCHC: 32.4 g/dL (ref 30.0–36.0)
MCV: 86.2 fL (ref 80.0–100.0)
Platelets: 261 K/uL (ref 150–400)
RBC: 3.4 MIL/uL — ABNORMAL LOW (ref 3.87–5.11)
RDW: 15.8 % — ABNORMAL HIGH (ref 11.5–15.5)
WBC: 6.9 K/uL (ref 4.0–10.5)
nRBC: 0 % (ref 0.0–0.2)

## 2024-01-25 LAB — D-DIMER, QUANTITATIVE: D-Dimer, Quant: 0.67 ug{FEU}/mL — ABNORMAL HIGH (ref 0.00–0.50)

## 2024-01-25 LAB — TROPONIN I (HIGH SENSITIVITY): Troponin I (High Sensitivity): <2 ng/L (ref <18)

## 2024-01-25 NOTE — ED Provider Notes (Signed)
 [facility]-EMERGENCY DEPT [facility] Emergency Department Provider Note MRN:  [id number]  Arrival date & time: 01/25/24     Chief Complaint   Chest pain History of Present Illness   Caitlyn Green is a 23 y.o. year-old female with a history of RA presenting to the ED with chief complaint of chest pain.  Chest pain today worse with deep breaths.  Mild cough this morning.  Some scratchy throat.  Never had chest pain before.  [redacted] weeks pregnant.  Review of Systems  A thorough review of systems was obtained and all systems are negative except as noted in the HPI and PMH.   Patient's Health History    Past Medical History:  Diagnosis Date   Back strain    Rheumatoid arthritis (HCC)     Past Surgical History:  Procedure Laterality Date   NO PAST SURGERIES      Family History  Problem Relation Age of Onset   Arthritis Maternal Grandmother    Cancer Father    Healthy Mother    Healthy Brother    Healthy Sister    Healthy Sister     Social History   Socioeconomic History   Marital status: Single    Spouse name: Not on file   Number of children: Not on file   Years of education: Not on file   Highest education level: Not on file  Occupational History   Occupation: [employer]   Tobacco Use   Smoking status: Never   Smokeless tobacco: Never  Vaping Use   Vaping status: Some Days  Substance and Sexual Activity   Alcohol use: No   Drug use: No   Sexual activity: Yes    Birth control/protection: None  Other Topics Concern   Not on file  Social History Narrative   Not on file   Social Drivers of Health   Financial Resource Strain: Low Risk  (12/22/2023)   Overall Financial Resource Strain (CARDIA)    Difficulty of Paying Living Expenses: Not hard at all  Food Insecurity: No Food Insecurity (12/22/2023)   Hunger Vital Sign    Worried About Running Out of Food in the Last Year: Never true    Ran Out of Food in the Last Year: Never true  Transportation Needs: No  Transportation Needs (12/22/2023)   PRAPARE - Administrator, Civil Service (Medical): No    Lack of Transportation (Non-Medical): No  Physical Activity: Insufficiently Active (12/22/2023)   Exercise Vital Sign    Days of Exercise per Week: 2 days    Minutes of Exercise per Session: 30 min  Stress: No Stress Concern Present (12/22/2023)   [employer] of Occupational Health - Occupational Stress Questionnaire    Feeling of Stress: Not at all  Social Connections: Moderately Integrated (12/22/2023)   Social Connection and Isolation Panel    Frequency of Communication with Friends and Family: More than three times a week    Frequency of Social Gatherings with Friends and Family: More than three times a week    Attends Religious Services: More than 4 times per year    Active Member of [employer] or Organizations: Yes    Attends Banker Meetings: More than 4 times per year    Marital Status: Never married  Intimate Partner Violence: Not At Risk (12/22/2023)   Humiliation, Afraid, Rape, and Kick questionnaire    Fear of Current or Ex-Partner: No    Emotionally Abused: No    Physically Abused: No  Sexually Abused: No     Physical Exam   Vitals:   01/25/24 0131 01/25/24 0226  BP: 120/78 125/80  Pulse: 79 75  Resp: 17 16  Temp: 98 F (36.7 C) 98 F (36.7 C)  SpO2: 96% 96%    CONSTITUTIONAL: Well-appearing, NAD NEURO/PSYCH:  Alert and oriented x 3, no focal deficits EYES:  eyes equal and reactive ENT/NECK:  no LAD, no JVD CARDIO: Regular rate, well-perfused, normal S1 and S2 PULM:  CTAB no wheezing or rhonchi GI/GU:  non-distended, non-tender MSK/SPINE:  No gross deformities, no edema SKIN:  no rash, atraumatic   *Additional and/or pertinent findings included in MDM below  Diagnostic and Interventional Summary    EKG Interpretation Date/Time:  Sunday January 24 2024 23:47:17 EDT Ventricular Rate:  83 PR Interval:  114 QRS Duration:  97 QT  Interval:  372 QTC Calculation: 438 R Axis:   80  Text Interpretation: Sinus rhythm Borderline short PR interval Consider right atrial enlargement Minimal ST depression, inferior leads Confirmed by Theadore Sharper [phone]) on 01/25/2024 12:24:55 AM       Labs Reviewed  CBC - Abnormal; Notable for the following components:      Result Value   RBC 3.40 (*)    Hemoglobin 9.5 (*)    HCT 29.3 (*)    RDW 15.8 (*)    All other components within normal limits  BASIC METABOLIC PANEL WITH GFR - Abnormal; Notable for the following components:   Sodium 133 (*)    Potassium 3.1 (*)    CO2 21 (*)    Glucose, Bld 151 (*)    Calcium 8.2 (*)    All other components within normal limits  D-DIMER, QUANTITATIVE - Abnormal; Notable for the following components:   D-Dimer, Quant 0.67 (*)    All other components within normal limits  TROPONIN I (HIGH SENSITIVITY)    DG Chest Port 1 View  Final Result      Medications  acetaminophen  (TYLENOL ) tablet 1,000 mg (1,000 mg Oral Given 01/25/24 0024)     Procedures  /  Critical Care Procedures  ED Course and Medical Decision Making  Initial Impression and Ddx PE is considered, felt to be low risk, more likely viral illness.  Patient is pregnant however raising her risk.  Will proceed with a years rule out.  Past medical/surgical history that increases complexity of ED encounter: Currently pregnant  Interpretation of Diagnostics I personally reviewed the EKG and my interpretation is as follows: Sinus rhythm, nonspecific findings  No significant blood count or electrolyte disturbance.  Troponin negative.  D-dimer weakly positive but less than the cutoff of 1000 ng/mL per years criteria  Patient Reassessment and Ultimate Disposition/Management     Patient sleeping peacefully on reassessment with normal vital signs, wakes easily, no acute distress.  PE largely excluded by years algorithm, appropriate for discharge.  Patient management required  discussion with the following services or consulting groups:  None  Complexity of Problems Addressed Acute illness or injury that poses threat of life of bodily function  Additional Data Reviewed and Analyzed Further history obtained from: None  Additional Factors Impacting ED Encounter Risk Consideration of hospitalization  Sharper HERO. Theadore, MD [facility] Emergency Medicine [facility] [email or web address]  Final Clinical Impressions(s) / ED Diagnoses     ICD-10-CM   1. Chest pain, unspecified type  R07.9     2. Acute cough  R05.1       ED Discharge Orders  None        Discharge Instructions Discussed with and Provided to Patient:     Discharge Instructions      You were evaluated in the Emergency Department and after careful evaluation, we did not find any emergent condition requiring admission or further testing in the hospital.  Your exam/testing today is overall reassuring.  Recommend continued follow-up with your primary care doctor and/or OB/GYN.  Please return to the Emergency Department if you experience any worsening of your condition.   Thank you for allowing us  to be a part of your care.       Theadore Ozell HERO, MD 01/25/24 [phone]

## 2024-01-25 NOTE — Discharge Instructions (Signed)
 You were evaluated in the Emergency Department and after careful evaluation, we did not find any emergent condition requiring admission or further testing in the hospital.  Your exam/testing today is overall reassuring.  Recommend continued follow-up with your primary care doctor and/or OB/GYN.  Please return to the Emergency Department if you experience any worsening of your condition.   Thank you for allowing us  to be a part of your care.

## 2024-02-09 ENCOUNTER — Ambulatory Visit: Admitting: Obstetrics and Gynecology

## 2024-02-09 ENCOUNTER — Ambulatory Visit

## 2024-02-09 VITALS — BP 103/71 | HR 80 | Wt 126.0 lb

## 2024-02-09 DIAGNOSIS — O09299 Supervision of pregnancy with other poor reproductive or obstetric history, unspecified trimester: Secondary | ICD-10-CM

## 2024-02-09 DIAGNOSIS — Z363 Encounter for antenatal screening for malformations: Secondary | ICD-10-CM

## 2024-02-09 DIAGNOSIS — Z3480 Encounter for supervision of other normal pregnancy, unspecified trimester: Secondary | ICD-10-CM

## 2024-02-09 DIAGNOSIS — Z3482 Encounter for supervision of other normal pregnancy, second trimester: Secondary | ICD-10-CM | POA: Diagnosis not present

## 2024-02-09 DIAGNOSIS — O09292 Supervision of pregnancy with other poor reproductive or obstetric history, second trimester: Secondary | ICD-10-CM | POA: Diagnosis not present

## 2024-02-09 DIAGNOSIS — M0880 Other juvenile arthritis, unspecified site: Secondary | ICD-10-CM

## 2024-02-09 DIAGNOSIS — Z3A20 20 weeks gestation of pregnancy: Secondary | ICD-10-CM | POA: Diagnosis not present

## 2024-02-09 NOTE — Progress Notes (Signed)
   PRENATAL VISIT NOTE  Subjective:  Caitlyn Green is a 23 y.o. G3P2002 at [redacted]w[redacted]d being seen today for ongoing prenatal care.  She is currently monitored for the following issues for this low-risk pregnancy and has Asthma; JIA (juvenile idiopathic arthritis) (HCC); Alpha thalassemia silent carrier; Abnormal Pap smear of cervix; and Supervision of other normal pregnancy, antepartum on their problem list.  Patient reports no complaints.  Contractions: Not present. Vag. Bleeding: None.  Movement: Present. Denies leaking of fluid.   The following portions of the patient's history were reviewed and updated as appropriate: allergies, current medications, past family history, past medical history, past social history, past surgical history and problem list.   Objective:    Vitals:   02/09/24 1507  BP: 103/71  Pulse: 80  Weight: 126 lb (57.2 kg)    Fetal Status:      Movement: Present    General: Alert, oriented and cooperative. Patient is in no acute distress.  Skin: Skin is warm and dry. No rash noted.   Cardiovascular: Normal heart rate noted  Respiratory: Normal respiratory effort, no problems with respiration noted  Abdomen: Soft, gravid, appropriate for gestational age.  Pain/Pressure: Absent     Pelvic: Cervical exam deferred        Extremities: Normal range of motion.     Mental Status: Normal mood and affect. Normal behavior. Normal judgment and thought content.   US  20+2 wks,breech,cx 3.7 cm,anterior placenta gr 0,normal ovaries,FHR 145 bpm,SVP of fluid 5 cm,EFW 334 g 36%,anatomy complete,no obvious abnormalities   Assessment and Plan:  Pregnancy: G3P2002 at [redacted]w[redacted]d 1. Supervision of other normal pregnancy, antepartum (Primary)  2. JIA (juvenile idiopathic arthritis) (HCC)   3. [redacted] weeks gestation of pregnancy Normal anatomy scan today  Planning to breast and supplement [employer] (atrium)   4. History of IUGR (intrauterine growth retardation) and stillbirth,  currently pregnant, unspecified trimester Normal growth today, plan serial growths  - US  OB Follow Up; Future  Preterm labor symptoms and general obstetric precautions including but not limited to vaginal bleeding, contractions, leaking of fluid and fetal movement were reviewed in detail with the patient. Please refer to After Visit Summary for other counseling recommendations.   Return in about 4 weeks (around 03/08/2024) for OB VISIT (MD or APP).   Nidia Daring, FNP

## 2024-02-09 NOTE — Progress Notes (Signed)
 US  20+2 wks,breech,cx 3.7 cm,anterior placenta gr 0,normal ovaries,FHR 145 bpm,SVP of fluid 5 cm,EFW 334 g 36%,anatomy complete,no obvious abnormalities

## 2024-03-08 ENCOUNTER — Encounter: Admitting: Women's Health

## 2024-03-09 ENCOUNTER — Ambulatory Visit (INDEPENDENT_AMBULATORY_CARE_PROVIDER_SITE_OTHER): Admitting: Obstetrics and Gynecology

## 2024-03-09 ENCOUNTER — Ambulatory Visit (INDEPENDENT_AMBULATORY_CARE_PROVIDER_SITE_OTHER)

## 2024-03-09 VITALS — BP 97/63 | HR 83 | Wt 133.0 lb

## 2024-03-09 DIAGNOSIS — O99891 Other specified diseases and conditions complicating pregnancy: Secondary | ICD-10-CM | POA: Diagnosis not present

## 2024-03-09 DIAGNOSIS — O09292 Supervision of pregnancy with other poor reproductive or obstetric history, second trimester: Secondary | ICD-10-CM

## 2024-03-09 DIAGNOSIS — M0880 Other juvenile arthritis, unspecified site: Secondary | ICD-10-CM

## 2024-03-09 DIAGNOSIS — Z3A24 24 weeks gestation of pregnancy: Secondary | ICD-10-CM

## 2024-03-09 DIAGNOSIS — O09299 Supervision of pregnancy with other poor reproductive or obstetric history, unspecified trimester: Secondary | ICD-10-CM

## 2024-03-09 DIAGNOSIS — Z3480 Encounter for supervision of other normal pregnancy, unspecified trimester: Secondary | ICD-10-CM

## 2024-03-09 NOTE — Progress Notes (Signed)
 US  24+3 wks,transverse head left,cx 3.5 cm,anterior placenta gr 0,normal ovaries,FHR 158 bpm,SVP of fluid 4.6 cm,EFW 658 g 26%

## 2024-03-09 NOTE — Progress Notes (Signed)
   PRENATAL VISIT NOTE  Subjective:  Caitlyn Green is a 23 y.o. G3P2002 at [redacted]w[redacted]d being seen today for ongoing prenatal care.  She is currently monitored for the following issues for this low-risk pregnancy and has Asthma; JIA (juvenile idiopathic arthritis) (HCC); Alpha thalassemia silent carrier; Abnormal Pap smear of cervix; and Supervision of other normal pregnancy, antepartum on their problem list.  Patient reports no complaints.  Contractions: Not present. Vag. Bleeding: None.  Movement: Present. Denies leaking of fluid.   The following portions of the patient's history were reviewed and updated as appropriate: allergies, current medications, past family history, past medical history, past social history, past surgical history and problem list.   Objective:    Vitals:   03/09/24 0858  BP: 97/63  Pulse: 83  Weight: 133 lb (60.3 kg)    Fetal Status:      Movement: Present    General: Alert, oriented and cooperative. Patient is in no acute distress.  Skin: Skin is warm and dry. No rash noted.   Cardiovascular: Normal heart rate noted  Respiratory: Normal respiratory effort, no problems with respiration noted  Abdomen: Soft, gravid, appropriate for gestational age.  Pain/Pressure: Absent     Pelvic: Cervical exam deferred        Extremities: Normal range of motion.     Mental Status: Normal mood and affect. Normal behavior. Normal judgment and thought content.    Fetal surveillance: US  24+3 wks,transverse head left,cx 3.5 cm,anterior placenta gr 0,normal ovaries,FHR 158 bpm,SVP of fluid 4.6 cm,EFW 658 g 26%   Assessment and Plan:  Pregnancy: G3P2002 at [redacted]w[redacted]d 1. Supervision of other normal pregnancy, antepartum (Primary)   2. JIA (juvenile idiopathic arthritis) [facility]) Follows rheumatology, he mentioned different medication options with her to include sulfasalazine, enbrel, prednisone , discussed weighing r/b different options  She did not take any previous pregnancy   3.  History of IUGR (intrauterine growth retardation) and stillbirth, currently pregnant, unspecified trimester Serial growths EFW 26% today   4. [redacted] weeks gestation of pregnancy Anticipatory guidance regarding GTT and labs next visit, discussed NPO status after midnight    Preterm labor symptoms and general obstetric precautions including but not limited to vaginal bleeding, contractions, leaking of fluid and fetal movement were reviewed in detail with the patient. Please refer to After Visit Summary for other counseling recommendations.   Return in about 4 weeks (around 04/06/2024), or LOB, PN2, follow up growth u/s.  Future Appointments  Date Time Provider Department Center  04/06/2024  8:30 AM [facility]  04/06/2024  9:50 AM Kizzie Suzen SAUNDERS, CNM [facility]    Nidia Daring, FNP

## 2024-03-30 ENCOUNTER — Encounter: Payer: Self-pay | Admitting: Women's Health

## 2024-04-06 ENCOUNTER — Encounter: Admitting: Women's Health

## 2024-04-06 ENCOUNTER — Other Ambulatory Visit

## 2024-04-11 ENCOUNTER — Ambulatory Visit: Admitting: Women's Health

## 2024-04-11 ENCOUNTER — Encounter: Payer: Self-pay | Admitting: Women's Health

## 2024-04-11 ENCOUNTER — Other Ambulatory Visit

## 2024-04-11 ENCOUNTER — Ambulatory Visit (INDEPENDENT_AMBULATORY_CARE_PROVIDER_SITE_OTHER)

## 2024-04-11 VITALS — BP 99/67 | HR 73 | Wt 138.4 lb

## 2024-04-11 DIAGNOSIS — Z3A33 33 weeks gestation of pregnancy: Secondary | ICD-10-CM | POA: Diagnosis not present

## 2024-04-11 DIAGNOSIS — Z3482 Encounter for supervision of other normal pregnancy, second trimester: Secondary | ICD-10-CM

## 2024-04-11 DIAGNOSIS — Z3483 Encounter for supervision of other normal pregnancy, third trimester: Secondary | ICD-10-CM

## 2024-04-11 DIAGNOSIS — Z131 Encounter for screening for diabetes mellitus: Secondary | ICD-10-CM

## 2024-04-11 DIAGNOSIS — Z3480 Encounter for supervision of other normal pregnancy, unspecified trimester: Secondary | ICD-10-CM

## 2024-04-11 DIAGNOSIS — Z3A29 29 weeks gestation of pregnancy: Secondary | ICD-10-CM | POA: Diagnosis not present

## 2024-04-11 DIAGNOSIS — O09293 Supervision of pregnancy with other poor reproductive or obstetric history, third trimester: Secondary | ICD-10-CM | POA: Diagnosis not present

## 2024-04-11 DIAGNOSIS — O09299 Supervision of pregnancy with other poor reproductive or obstetric history, unspecified trimester: Secondary | ICD-10-CM

## 2024-04-11 NOTE — Patient Instructions (Signed)
 Caitlyn Green, thank you for choosing our office today! We appreciate the opportunity to meet your healthcare needs. You may receive a short survey by mail, e-mail, or through [employer]. If you are happy with your care we would appreciate if you could take just a few minutes to complete the survey questions. We read all of your comments and take your feedback very seriously. Thank you again for choosing our office.  Center for [employer] Team at [facility]  [facility] at [facility] ([address] [place], [place] [address]) Entrance C, located off of [address] Free 24/7 valet parking   CLASSES: Go to [email or web address] to register for classes (childbirth, breastfeeding, waterbirth, infant CPR, daddy bootcamp, etc.)  Call the office [phone]) or go to [facility] if: You begin to have strong, frequent contractions Your water breaks.  Sometimes it is a big gush of fluid, sometimes it is just a trickle that keeps getting your panties wet or running down your legs You have vaginal bleeding.  It is normal to have a small amount of spotting if your cervix was checked.  You don't feel your baby moving like normal.  If you don't, get you something to eat and drink and lay down and focus on feeling your baby move.   If your baby is still not moving like normal, you should call the office or go to [facility].  Call the office [phone]) or go to [facility] hospital for these signs of pre-eclampsia: Severe headache that does not go away with Tylenol  Visual changes- seeing spots, double, blurred vision Pain under your right breast or upper abdomen that does not go away with Tums or heartburn medicine Nausea and/or vomiting Severe swelling in your hands, feet, and face   Tdap Vaccine It is recommended that you get the Tdap vaccine during the third trimester of EACH pregnancy to help protect your baby from getting pertussis (whooping cough) 27-36 weeks is the BEST time to do  this so that you can pass the protection on to your baby. During pregnancy is better than after pregnancy, but if you are unable to get it during pregnancy it will be offered at the hospital.  You can get this vaccine with us , at the health department, your family doctor, or some local pharmacies Everyone who will be around your baby should also be up-to-date on their vaccines before the baby comes. Adults (who are not pregnant) only need 1 dose of Tdap during adulthood.   [facility] Pediatricians/Family Doctors [facility]): [address] [place], [phone]           [facility]: [address], [phone]                [facility] Medicine [facility]): [address], [phone] (call to ask if accepting patients) [facility] Department: [address], [place], [phone]    [facility] Pediatricians/Family Doctors [facility]): [phone], [phone] [facility]: [address] [place], [phone] [facility]: [address], [phone]  [facility] Doctors  [facility]): [phone] [facility]: [address] [phone]   [facility]: [address], [phone]  [facility] Family Doctors  [facility]: [phone], [phone]  Home Blood Pressure Monitoring for Patients   Your provider has recommended that you check your  blood pressure (BP) at least once a week at home. If you do not have a blood pressure cuff at home, one will be provided for you. Contact your provider if you have not received your monitor within 1 week.   Helpful Tips for Accurate Home Blood Pressure Checks  Don't smoke, exercise, or drink caffeine  30 minutes before checking your BP Use the restroom before checking your BP (a full bladder can raise your  pressure) Relax in a comfortable upright chair Feet on the ground Left arm resting comfortably on a flat surface at the level of your heart Legs uncrossed Back supported Sit quietly and don't talk Place the cuff on your bare arm Adjust snuggly, so that only two fingertips can fit between your skin and the top of the cuff Check 2 readings separated by at least one minute Keep a log of your BP readings For a visual, please reference this diagram: http://ccnc.care/bpdiagram  Provider Name: [facility]     Phone: [phone]  Zone 1: ALL CLEAR  Continue to monitor your symptoms:  BP reading is less than 140 (top number) or less than 90 (bottom number)  No right upper stomach pain No headaches or seeing spots No feeling nauseated or throwing up No swelling in face and hands  Zone 2: CAUTION Call your doctor's office for any of the following:  BP reading is greater than 140 (top number) or greater than 90 (bottom number)  Stomach pain under your ribs in the middle or right side Headaches or seeing spots Feeling nauseated or throwing up Swelling in face and hands  Zone 3: EMERGENCY  Seek immediate medical care if you have any of the following:  BP reading is greater than160 (top number) or greater than 110 (bottom number) Severe headaches not improving with Tylenol  Serious difficulty catching your breath Any worsening symptoms from Zone 2   Third Trimester of Pregnancy The third trimester is from week 29 through week 42, months 7 through 9. The third trimester is a time when the fetus is growing rapidly. At the end of the ninth month, the fetus is about 20 inches in length and weighs 6-10 pounds.  BODY CHANGES Your body goes through many changes during pregnancy. The changes vary from woman to woman.  Your weight will continue to increase. You can expect to gain 25-35 pounds (11-16 kg) by the end of the pregnancy. You may begin to get stretch marks on your hips, abdomen,  and breasts. You may urinate more often because the fetus is moving lower into your pelvis and pressing on your bladder. You may develop or continue to have heartburn as a result of your pregnancy. You may develop constipation because certain hormones are causing the muscles that push waste through your intestines to slow down. You may develop hemorrhoids or swollen, bulging veins (varicose veins). You may have pelvic pain because of the weight gain and pregnancy hormones relaxing your joints between the bones in your pelvis. Backaches may result from overexertion of the muscles supporting your posture. You may have changes in your hair. These can include thickening of your hair, rapid growth, and changes in texture. Some women also have hair loss during or after pregnancy, or hair that feels dry or thin. Your hair will most likely return to normal after your baby is born. Your breasts will continue to grow and be tender. A yellow discharge may leak from your breasts called colostrum. Your belly button may stick out. You may  feel short of breath because of your expanding uterus. You may notice the fetus dropping, or moving lower in your abdomen. You may have a bloody mucus discharge. This usually occurs a few days to a week before labor begins. Your cervix becomes thin and soft (effaced) near your due date. WHAT TO EXPECT AT YOUR PRENATAL EXAMS  You will have prenatal exams every 2 weeks until week 36. Then, you will have weekly prenatal exams. During a routine prenatal visit: You will be weighed to make sure you and the fetus are growing normally. Your blood pressure is taken. Your abdomen will be measured to track your baby's growth. The fetal heartbeat will be listened to. Any test results from the previous visit will be discussed. You may have a cervical check near your due date to see if you have effaced. At around 36 weeks, your caregiver will check your cervix. At the same time, your  caregiver will also perform a test on the secretions of the vaginal tissue. This test is to determine if a type of bacteria, Group B streptococcus, is present. Your caregiver will explain this further. Your caregiver may ask you: What your birth plan is. How you are feeling. If you are feeling the baby move. If you have had any abnormal symptoms, such as leaking fluid, bleeding, severe headaches, or abdominal cramping. If you have any questions. Other tests or screenings that may be performed during your third trimester include: Blood tests that check for low iron  levels (anemia). Fetal testing to check the health, activity level, and growth of the fetus. Testing is done if you have certain medical conditions or if there are problems during the pregnancy. FALSE LABOR You may feel small, irregular contractions that eventually go away. These are called Braxton Hicks contractions, or false labor. Contractions may last for hours, days, or even weeks before true labor sets in. If contractions come at regular intervals, intensify, or become painful, it is best to be seen by your caregiver.  SIGNS OF LABOR  Menstrual-like cramps. Contractions that are 5 minutes apart or less. Contractions that start on the top of the uterus and spread down to the lower abdomen and back. A sense of increased pelvic pressure or back pain. A watery or bloody mucus discharge that comes from the vagina. If you have any of these signs before the 37th week of pregnancy, call your caregiver right away. You need to go to the hospital to get checked immediately. HOME CARE INSTRUCTIONS  Avoid all smoking, herbs, alcohol, and unprescribed drugs. These chemicals affect the formation and growth of the baby. Follow your caregiver's instructions regarding medicine use. There are medicines that are either safe or unsafe to take during pregnancy. Exercise only as directed by your caregiver. Experiencing uterine cramps is a good sign to  stop exercising. Continue to eat regular, healthy meals. Wear a good support bra for breast tenderness. Do not use hot tubs, steam rooms, or saunas. Wear your seat belt at all times when driving. Avoid raw meat, uncooked cheese, cat litter boxes, and soil used by cats. These carry germs that can cause birth defects in the baby. Take your prenatal vitamins. Try taking a stool softener (if your caregiver approves) if you develop constipation. Eat more high-fiber foods, such as fresh vegetables or fruit and whole grains. Drink plenty of fluids to keep your urine clear or pale yellow. Take warm sitz baths to soothe any pain or discomfort caused by hemorrhoids. Use hemorrhoid cream if  your caregiver approves. If you develop varicose veins, wear support hose. Elevate your feet for 15 minutes, 3-4 times a day. Limit salt in your diet. Avoid heavy lifting, wear low heal shoes, and practice good posture. Rest a lot with your legs elevated if you have leg cramps or low back pain. Visit your dentist if you have not gone during your pregnancy. Use a soft toothbrush to brush your teeth and be gentle when you floss. A sexual relationship may be continued unless your caregiver directs you otherwise. Do not travel far distances unless it is absolutely necessary and only with the approval of your caregiver. Take prenatal classes to understand, practice, and ask questions about the labor and delivery. Make a trial run to the hospital. Pack your hospital bag. Prepare the baby's nursery. Continue to go to all your prenatal visits as directed by your caregiver. SEEK MEDICAL CARE IF: You are unsure if you are in labor or if your water has broken. You have dizziness. You have mild pelvic cramps, pelvic pressure, or nagging pain in your abdominal area. You have persistent nausea, vomiting, or diarrhea. You have a bad smelling vaginal discharge. You have pain with urination. SEEK IMMEDIATE MEDICAL CARE IF:  You  have a fever. You are leaking fluid from your vagina. You have spotting or bleeding from your vagina. You have severe abdominal cramping or pain. You have rapid weight loss or gain. You have shortness of breath with chest pain. You notice sudden or extreme swelling of your face, hands, ankles, feet, or legs. You have not felt your baby move in over an hour. You have severe headaches that do not go away with medicine. You have vision changes. Document Released: 06/17/2001 Document Revised: 06/28/2013 Document Reviewed: 08/24/2012 [facility] : [facility] Patient Information 2015 [place], [place]. This information is not intended to replace advice given to you by your health care provider. Make sure you discuss any questions you have with your health care provider.

## 2024-04-11 NOTE — Progress Notes (Signed)
 LOW-RISK PREGNANCY VISIT Patient name: Caitlyn Green MRN [id number]  Date of birth: Nov 24, 2000 Chief Complaint:   Routine Prenatal Visit and Pregnancy Ultrasound (PN2 )  History of Present Illness:   Caitlyn Green is a 23 y.o. G9P2002 female at [redacted]w[redacted]d with an Estimated Date of Delivery: 06/26/24 being seen today for ongoing management of a low-risk pregnancy.   Today she reports pelvic pressure. Contractions: Not present.  .  Movement: Present. denies leaking of fluid.     04/11/2024   11:39 AM 12/22/2023   10:14 AM 09/17/2021   10:22 AM 05/29/2021   11:21 AM 04/30/2020    4:19 PM  Depression screen PHQ 2/9  Decreased Interest 0 1 2 3 1   Down, Depressed, Hopeless 0 0 1 2 1   PHQ - 2 Score 0 1 3 5 2   Altered sleeping 0 1 0 1 3  Tired, decreased energy 0 1 1 2 1   Change in appetite 0 1 0 2 1  Feeling bad or failure about yourself  0 0 1 2 0  Trouble concentrating 0 0 0 1 0  Moving slowly or fidgety/restless 0 0 0 0 0  Suicidal thoughts 0 0 0 0 0  PHQ-9 Score 0 4 5 13 7         12/22/2023   10:15 AM 09/17/2021   10:22 AM 05/29/2021   11:22 AM 04/30/2020    4:19 PM  GAD 7 : Generalized Anxiety Score  Nervous, Anxious, on Edge 0 0 2 0  Control/stop worrying 0 0 2 0  Worry too much - different things 0 0 2 0  Trouble relaxing 0 0 2 0  Restless 0 0 1 0  Easily annoyed or irritable 1 2 3 2   Afraid - awful might happen 0 0 1 0  Total GAD 7 Score 1 2 13 2       Review of Systems:   Pertinent items are noted in HPI Denies abnormal vaginal discharge w/ itching/odor/irritation, headaches, visual changes, shortness of breath, chest pain, abdominal pain, severe nausea/vomiting, or problems with urination or bowel movements unless otherwise stated above. Pertinent History Reviewed:  Reviewed past medical,surgical, social, obstetrical and family history.  Reviewed problem list, medications and allergies. Physical Assessment:   Vitals:   04/11/24 1134  BP: 99/67  Pulse: 73   Weight: 138 lb 6.4 oz (62.8 kg)  Body mass index is 19.3 kg/m.        Physical Examination:   General appearance: Well appearing, and in no distress  Mental status: Alert, oriented to person, place, and time  Skin: Warm & dry  Cardiovascular: Normal heart rate noted  Respiratory: Normal respiratory effort, no distress  Abdomen: Soft, gravid, nontender  Pelvic: Cervical exam deferred         Extremities: Edema: None  Fetal Status:     Movement: Present  US  29+1 wks,cephalic,cx 2.8 cm,anterior placenta gr 0,normal ovaries,FHR 132 bpm,SVP of fluid 13 cm,EFW 1351 g 39%   Chaperone: N/A No results found for this or any previous visit (from the past 24 hours).  Assessment & Plan:  1) Low-risk pregnancy G3P2002 at [redacted]w[redacted]d with an Estimated Date of Delivery: 06/26/24   2) H/O FGR x 2, EFW today 39%  3) JIA> followed by rheumatology   Meds: No orders of the defined types were placed in this encounter.  Labs/procedures today: U/S, PN2, and declines tdap today, maybe next visit  Plan:  Continue routine obstetrical care  Next visit: prefers in  person    Reviewed: Preterm labor symptoms and general obstetric precautions including but not limited to vaginal bleeding, contractions, leaking of fluid and fetal movement were reviewed in detail with the patient.  All questions were answered. Does have home bp cuff. Office bp cuff given: not applicable. Check bp weekly, let us  know if consistently >140 and/or >90.  Follow-up: Return in about 2 weeks (around 04/25/2024) for LROB, CNM, in person; then 4wks EFW u/s and LROB w/ CNM.  No future appointments.  No orders of the defined types were placed in this encounter.  [name] CNM, [facility] 04/11/2024 12:04 PM

## 2024-04-11 NOTE — Progress Notes (Signed)
 US  29+1 wks,cephalic,cx 2.8 cm,anterior placenta gr 0,normal ovaries,FHR 132 bpm,SVP of fluid 13 cm,EFW 1351 g 39%

## 2024-04-12 ENCOUNTER — Ambulatory Visit: Payer: Self-pay | Admitting: Women's Health

## 2024-04-12 DIAGNOSIS — Z3480 Encounter for supervision of other normal pregnancy, unspecified trimester: Secondary | ICD-10-CM

## 2024-04-12 LAB — ANTIBODY SCREEN: Antibody Screen: NEGATIVE

## 2024-04-12 LAB — GLUCOSE TOLERANCE, 2 HOURS W/ 1HR
Glucose, 1 hour: 87 mg/dL (ref 70–179)
Glucose, 2 hour: 72 mg/dL (ref 70–152)
Glucose, Fasting: 71 mg/dL (ref 70–91)

## 2024-04-12 LAB — CBC
Hematocrit: 29.6 % — ABNORMAL LOW (ref 34.0–46.6)
Hemoglobin: 9.1 g/dL — ABNORMAL LOW (ref 11.1–15.9)
MCH: 26.5 pg — ABNORMAL LOW (ref 26.6–33.0)
MCHC: 30.7 g/dL — ABNORMAL LOW (ref 31.5–35.7)
MCV: 86 fL (ref 79–97)
Platelets: 259 x10E3/uL (ref 150–450)
RBC: 3.43 x10E6/uL — ABNORMAL LOW (ref 3.77–5.28)
RDW: 13.9 % (ref 11.7–15.4)
WBC: 5.1 x10E3/uL (ref 3.4–10.8)

## 2024-04-12 LAB — HIV ANTIBODY (ROUTINE TESTING W REFLEX): HIV Screen 4th Generation wRfx: NONREACTIVE

## 2024-04-12 LAB — RPR: RPR Ser Ql: NONREACTIVE

## 2024-04-16 ENCOUNTER — Inpatient Hospital Stay (HOSPITAL_COMMUNITY)
Admission: AD | Admit: 2024-04-16 | Discharge: 2024-04-16 | Disposition: A | Discharge status: Home / Self Care | Attending: Obstetrics & Gynecology | Admitting: Obstetrics & Gynecology

## 2024-04-16 DIAGNOSIS — Z3A29 29 weeks gestation of pregnancy: Secondary | ICD-10-CM

## 2024-04-16 DIAGNOSIS — R12 Heartburn: Secondary | ICD-10-CM

## 2024-04-16 DIAGNOSIS — O26893 Other specified pregnancy related conditions, third trimester: Secondary | ICD-10-CM

## 2024-04-16 DIAGNOSIS — O99891 Other specified diseases and conditions complicating pregnancy: Secondary | ICD-10-CM | POA: Insufficient documentation

## 2024-04-16 NOTE — Discharge Instructions (Signed)

## 2024-04-16 NOTE — MAU Note (Signed)
 Caitlyn Green is a 23 y.o. at [redacted]w[redacted]d here in MAU reporting: vomiting x1 started at 1030 that tasted like blood and had chest pain while actively vomiting but it has decrease now. Has random episodes of vomiting. States has occasional heartburn. Denies any abdominal pain currently. Denies any LOF or VB. Reports +FM  Onset of complaint: 1030 Pain score: 2 Vitals:   04/16/24 1252  BP: (!) 90/57  Pulse: 79  Resp: 16  Temp: 98.4 F (36.9 C)  SpO2: 100%     FHT:153 Lab orders placed from triage:

## 2024-04-16 NOTE — MAU Provider Note (Signed)
  S Caitlyn Green is a 23 y.o. [phone] patient who presents to MAU today with complaint of heartburn. She states she had one episode of vomiting and heartburn this morning. She states she called the nurse line who told her to call 911 for evaluation. Patient states she no longer has the pain and feels fine, but was scared because the nurse told her to call 911 so she came in anyway.  She denies any pain, vaginal bleeding or discharge. Reports normal fetal movement.   O BP (!) 90/57 (BP Location: Right Arm)   Pulse 79   Temp 98.4 F (36.9 C) (Oral)   Resp 16   Ht 5' 11 (1.803 m)   Wt 61.9 kg   LMP 09/20/2023 (Exact Date)   SpO2 100%   BMI 19.04 kg/m  Physical Exam Vitals and nursing note reviewed.  Constitutional:      General: She is not in acute distress.    Appearance: She is well-developed.  HENT:     Head: Normocephalic.  Eyes:     Pupils: Pupils are equal, round, and reactive to light.  Cardiovascular:     Rate and Rhythm: Normal rate and regular rhythm.     Heart sounds: Normal heart sounds.  Pulmonary:     Effort: Pulmonary effort is normal. No respiratory distress.     Breath sounds: Normal breath sounds.  Abdominal:     General: Bowel sounds are normal. There is no distension.     Palpations: Abdomen is soft.     Tenderness: There is no abdominal tenderness.  Skin:    General: Skin is warm and dry.  Neurological:     Mental Status: She is alert and oriented to person, place, and time.  Psychiatric:        Mood and Affect: Mood normal.        Behavior: Behavior normal.        Thought Content: Thought content normal.        Judgment: Judgment normal.     A Medical screening exam complete 1. Heartburn during pregnancy in third trimester   2. [redacted] weeks gestation of pregnancy      P -Discharge home in stable condition -Heartburn options reviewed -Third trimester precautions discussed -Patient advised to follow-up with OB as scheduled for prenatal  care -Patient may return to MAU as needed or if her condition were to change or worsen   Marylen Aleck HERO, [place] 04/16/2024 12:59 PM

## 2024-04-29 ENCOUNTER — Encounter: Payer: Self-pay | Admitting: Obstetrics and Gynecology

## 2024-04-29 ENCOUNTER — Ambulatory Visit (INDEPENDENT_AMBULATORY_CARE_PROVIDER_SITE_OTHER): Admitting: Obstetrics and Gynecology

## 2024-04-29 VITALS — BP 108/66 | HR 80 | Wt 142.0 lb

## 2024-04-29 DIAGNOSIS — Z23 Encounter for immunization: Secondary | ICD-10-CM | POA: Diagnosis not present

## 2024-04-29 DIAGNOSIS — O368130 Decreased fetal movements, third trimester, not applicable or unspecified: Secondary | ICD-10-CM | POA: Diagnosis not present

## 2024-04-29 DIAGNOSIS — Z3A31 31 weeks gestation of pregnancy: Secondary | ICD-10-CM | POA: Diagnosis not present

## 2024-04-29 DIAGNOSIS — O09293 Supervision of pregnancy with other poor reproductive or obstetric history, third trimester: Secondary | ICD-10-CM | POA: Diagnosis not present

## 2024-04-29 DIAGNOSIS — M0880 Other juvenile arthritis, unspecified site: Secondary | ICD-10-CM

## 2024-04-29 DIAGNOSIS — O09299 Supervision of pregnancy with other poor reproductive or obstetric history, unspecified trimester: Secondary | ICD-10-CM

## 2024-04-29 DIAGNOSIS — Z3480 Encounter for supervision of other normal pregnancy, unspecified trimester: Secondary | ICD-10-CM

## 2024-04-29 DIAGNOSIS — O99013 Anemia complicating pregnancy, third trimester: Secondary | ICD-10-CM

## 2024-04-29 NOTE — Progress Notes (Signed)
   PRENATAL VISIT NOTE  Subjective:  Caitlyn Green is a 23 y.o. G3P2002 at [redacted]w[redacted]d being seen today for ongoing prenatal care.  She is currently monitored for the following issues for this low-risk pregnancy and has Asthma; JIA (juvenile idiopathic arthritis) (HCC); Alpha thalassemia silent carrier; Abnormal Pap smear of cervix; and Supervision of other normal pregnancy, antepartum on their problem list.  Patient reports very active movement yesterday, today feeling less movement today .  Contractions: Not present.  .  Movement: (!) Decreased. Denies leaking of fluid.   The following portions of the patient's history were reviewed and updated as appropriate: allergies, current medications, past family history, past medical history, past social history, past surgical history and problem list.   Objective:    Vitals:   04/29/24 0906  BP: 108/66  Pulse: 80  Weight: 142 lb (64.4 kg)    Fetal Status:      Movement: (!) Decreased    General: Alert, oriented and cooperative. Patient is in no acute distress.  Skin: Skin is warm and dry. No rash noted.   Cardiovascular: Normal heart rate noted  Respiratory: Normal respiratory effort, no problems with respiration noted  Abdomen: Soft, gravid, appropriate for gestational age.  Pain/Pressure: Absent     Pelvic: Cervical exam deferred        Extremities: Normal range of motion.  Edema: None  Mental Status: Normal mood and affect. Normal behavior. Normal judgment and thought content.   Assessment and Plan:  Pregnancy: G3P2002 at [redacted]w[redacted]d 1. Supervision of other normal pregnancy, antepartum (Primary)   2. [redacted] weeks gestation of pregnancy Tdap today   3. JIA (juvenile idiopathic arthritis) (HCC) Follows rheumatology   4. History of IUGR (intrauterine growth retardation) and stillbirth, currently pregnant, unspecified trimester Hx FGRx2, EFW 9/3 29 %, has follow up u/s 11/3  5. Decreased fetal movements in third trimester, single or  unspecified fetus Discussed fetal kick counts  NST reactive today and feeling movement in the room, precautions provided when to follow up with any changes   6. Anemia affecting pregnancy in third trimester Continue oral iron , check early november   Preterm labor symptoms and general obstetric precautions including but not limited to vaginal bleeding, contractions, leaking of fluid and fetal movement were reviewed in detail with the patient. Please refer to After Visit Summary for other counseling recommendations.     Future Appointments  Date Time Provider Department Center  05/09/2024 10:45 AM [facility] 2 [facility] None  05/09/2024 11:30 AM Kizzie Suzen SAUNDERS, CNM [facility]  05/30/2024 10:50 AM Kizzie Suzen SAUNDERS, CNM [facility]  06/06/2024 10:00 AM [facility] IMG 2 [facility] None  06/06/2024 11:10 AM Kizzie Suzen SAUNDERS, CNM [facility]  06/13/2024  9:50 AM Kizzie Suzen SAUNDERS, CNM [facility]  06/20/2024  9:50 AM Kizzie Suzen SAUNDERS, CNM [facility]  06/27/2024  9:30 AM [facility] NURSE [facility]  06/27/2024  9:50 AM Cresenzo-Dishmon, Cathlean, CNM [facility]    Nidia Daring, FNP

## 2024-05-02 ENCOUNTER — Other Ambulatory Visit: Payer: Self-pay | Admitting: Obstetrics & Gynecology

## 2024-05-02 DIAGNOSIS — O368131 Decreased fetal movements, third trimester, fetus 1: Secondary | ICD-10-CM

## 2024-05-02 DIAGNOSIS — Z8759 Personal history of other complications of pregnancy, childbirth and the puerperium: Secondary | ICD-10-CM

## 2024-05-04 ENCOUNTER — Other Ambulatory Visit: Payer: Self-pay | Admitting: Obstetrics & Gynecology

## 2024-05-04 DIAGNOSIS — O368131 Decreased fetal movements, third trimester, fetus 1: Secondary | ICD-10-CM

## 2024-05-09 ENCOUNTER — Encounter: Payer: Self-pay | Admitting: Women's Health

## 2024-05-09 ENCOUNTER — Ambulatory Visit (INDEPENDENT_AMBULATORY_CARE_PROVIDER_SITE_OTHER): Admitting: Women's Health

## 2024-05-09 ENCOUNTER — Ambulatory Visit (INDEPENDENT_AMBULATORY_CARE_PROVIDER_SITE_OTHER): Admitting: Radiology

## 2024-05-09 VITALS — BP 111/69 | HR 85 | Wt 139.5 lb

## 2024-05-09 DIAGNOSIS — O99013 Anemia complicating pregnancy, third trimester: Secondary | ICD-10-CM | POA: Diagnosis not present

## 2024-05-09 DIAGNOSIS — O0993 Supervision of high risk pregnancy, unspecified, third trimester: Secondary | ICD-10-CM | POA: Diagnosis not present

## 2024-05-09 DIAGNOSIS — Z3A33 33 weeks gestation of pregnancy: Secondary | ICD-10-CM

## 2024-05-09 DIAGNOSIS — F5089 Other specified eating disorder: Secondary | ICD-10-CM

## 2024-05-09 DIAGNOSIS — O368131 Decreased fetal movements, third trimester, fetus 1: Secondary | ICD-10-CM

## 2024-05-09 DIAGNOSIS — Z8759 Personal history of other complications of pregnancy, childbirth and the puerperium: Secondary | ICD-10-CM

## 2024-05-09 DIAGNOSIS — Z3483 Encounter for supervision of other normal pregnancy, third trimester: Secondary | ICD-10-CM

## 2024-05-09 NOTE — Patient Instructions (Signed)
 Atheena, thank you for choosing our office today! We appreciate the opportunity to meet your healthcare needs. You may receive a short survey by mail, e-mail, or through [employer]. If you are happy with your care we would appreciate if you could take just a few minutes to complete the survey questions. We read all of your comments and take your feedback very seriously. Thank you again for choosing our office.  Center for [employer] Team at [facility]  [facility] at [facility] ([address] [place], [place] [address]) Entrance C, located off of [address] Free 24/7 valet parking   CLASSES: Go to [email or web address] to register for classes (childbirth, breastfeeding, waterbirth, infant CPR, daddy bootcamp, etc.)  Call the office [phone]) or go to [facility] if: You begin to have strong, frequent contractions Your water breaks.  Sometimes it is a big gush of fluid, sometimes it is just a trickle that keeps getting your panties wet or running down your legs You have vaginal bleeding.  It is normal to have a small amount of spotting if your cervix was checked.  You don't feel your baby moving like normal.  If you don't, get you something to eat and drink and lay down and focus on feeling your baby move.   If your baby is still not moving like normal, you should call the office or go to [facility].  Call the office [phone]) or go to [facility] hospital for these signs of pre-eclampsia: Severe headache that does not go away with Tylenol  Visual changes- seeing spots, double, blurred vision Pain under your right breast or upper abdomen that does not go away with Tums or heartburn medicine Nausea and/or vomiting Severe swelling in your hands, feet, and face   Tdap Vaccine It is recommended that you get the Tdap vaccine during the third trimester of EACH pregnancy to help protect your baby from getting pertussis (whooping cough) 27-36 weeks is the BEST time to do  this so that you can pass the protection on to your baby. During pregnancy is better than after pregnancy, but if you are unable to get it during pregnancy it will be offered at the hospital.  You can get this vaccine with us , at the health department, your family doctor, or some local pharmacies Everyone who will be around your baby should also be up-to-date on their vaccines before the baby comes. Adults (who are not pregnant) only need 1 dose of Tdap during adulthood.   [facility] Pediatricians/Family Doctors [facility]): [address] [place], [phone]           [facility]: [address], [phone]                [facility]): [address], [phone] (call to ask if accepting patients) [facility] Department: [address], [place], [phone]    [facility] Pediatricians/Family Doctors [facility]): [phone] [address], [phone] [facility]: [address] [place], [phone] [facility]: [address], [phone]  [facility] Doctors  [facility]): [phone] [facility]: [address] [phone]   [facility] Doctors [facility]: [address], [phone]  [facility] Family Doctors  [facility]: [phone], [phone]  Home Blood Pressure Monitoring for Patients   Your provider has recommended that you check your  blood pressure (BP) at least once a week at home. If you do not have a blood pressure cuff at home, one will be provided for you. Contact your provider if you have not received your monitor within 1 week.   Helpful Tips for Accurate Home Blood Pressure Checks  Don't smoke, exercise, or drink caffeine  30 minutes before checking your BP Use the restroom before checking your BP (a full bladder can raise your  pressure) Relax in a comfortable upright chair Feet on the ground Left arm resting comfortably on a flat surface at the level of your heart Legs uncrossed Back supported Sit quietly and don't talk Place the cuff on your bare arm Adjust snuggly, so that only two fingertips can fit between your skin and the top of the cuff Check 2 readings separated by at least one minute Keep a log of your BP readings For a visual, please reference this diagram: http://ccnc.care/bpdiagram  Provider Name: [facility]     Phone: [phone]  Zone 1: ALL CLEAR  Continue to monitor your symptoms:  BP reading is less than 140 (top number) or less than 90 (bottom number)  No right upper stomach pain No headaches or seeing spots No feeling nauseated or throwing up No swelling in face and hands  Zone 2: CAUTION Call your doctor's office for any of the following:  BP reading is greater than 140 (top number) or greater than 90 (bottom number)  Stomach pain under your ribs in the middle or right side Headaches or seeing spots Feeling nauseated or throwing up Swelling in face and hands  Zone 3: EMERGENCY  Seek immediate medical care if you have any of the following:  BP reading is greater than160 (top number) or greater than 110 (bottom number) Severe headaches not improving with Tylenol  Serious difficulty catching your breath Any worsening symptoms from Zone 2  Preterm Labor and Birth Information  The normal length of a pregnancy is 39-41 weeks. Preterm labor is when labor starts before 37 completed weeks of pregnancy. What are the risk factors for preterm labor? Preterm labor is more likely to occur in women who: Have certain infections during pregnancy such as a bladder infection, sexually transmitted infection, or infection inside the uterus (chorioamnionitis). Have a shorter-than-normal cervix. Have gone into preterm labor before. Have had surgery on their cervix. Are younger than age 64  or older than age 55. Are African American. Are pregnant with twins or multiple babies (multiple gestation). Take street drugs or smoke while pregnant. Do not gain enough weight while pregnant. Became pregnant shortly after having been pregnant. What are the symptoms of preterm labor? Symptoms of preterm labor include: Cramps similar to those that can happen during a menstrual period. The cramps may happen with diarrhea. Pain in the abdomen or lower back. Regular uterine contractions that may feel like tightening of the abdomen. A feeling of increased pressure in the pelvis. Increased watery or bloody mucus discharge from the vagina. Water breaking (ruptured amniotic sac). Why is it important to recognize signs of preterm labor? It is important to recognize signs of preterm labor because babies who are born prematurely may not be fully developed. This can put them at an increased risk for: Long-term (chronic) heart and lung problems. Difficulty immediately after birth with regulating body systems, including blood sugar, body temperature, heart rate, and breathing rate. Bleeding in the brain. Cerebral palsy. Learning difficulties. Death. These risks are highest for babies who are born before 34 weeks  of pregnancy. How is preterm labor treated? Treatment depends on the length of your pregnancy, your condition, and the health of your baby. It may involve: Having a stitch (suture) placed in your cervix to prevent your cervix from opening too early (cerclage). Taking or being given medicines, such as: Hormone medicines. These may be given early in pregnancy to help support the pregnancy. Medicine to stop contractions. Medicines to help mature the baby's lungs. These may be prescribed if the risk of delivery is high. Medicines to prevent your baby from developing cerebral palsy. If the labor happens before 34 weeks of pregnancy, you may need to stay in the hospital. What should I do if I  think I am in preterm labor? If you think that you are going into preterm labor, call your health care provider right away. How can I prevent preterm labor in future pregnancies? To increase your chance of having a full-term pregnancy: Do not use any tobacco products, such as cigarettes, chewing tobacco, and e-cigarettes. If you need help quitting, ask your health care provider. Do not use street drugs or medicines that have not been prescribed to you during your pregnancy. Talk with your health care provider before taking any herbal supplements, even if you have been taking them regularly. Make sure you gain a healthy amount of weight during your pregnancy. Watch for infection. If you think that you might have an infection, get it checked right away. Make sure to tell your health care provider if you have gone into preterm labor before. This information is not intended to replace advice given to you by your health care provider. Make sure you discuss any questions you have with your health care provider. Document Revised: 10/15/2018 Document Reviewed: 11/14/2015 Elsevier Patient Education  2020 [employer].

## 2024-05-09 NOTE — Progress Notes (Signed)
 LOW-RISK PREGNANCY VISIT Patient name: Caitlyn Green MRN [id number]  Date of birth: 2000/11/22 Chief Complaint:   Routine Prenatal Visit  History of Present Illness:   Caitlyn Green is a 23 y.o. G49P2002 female at [redacted]w[redacted]d with an Estimated Date of Delivery: 06/26/24 being seen today for ongoing management of a low-risk pregnancy.   Today she reports contractions, sometimes q , not that often this am. Denies abnormal discharge, itching/odor/irritation.  Denies UTI sx. Taking po Fe as directed since last visit, eating dirt (orders edible white clay/dirt from [employer]) and lots of ice. Contractions: Not present. Vag. Bleeding: None.  Movement: Present. denies leaking of fluid.     04/11/2024   11:39 AM 12/22/2023   10:14 AM 09/17/2021   10:22 AM 05/29/2021   11:21 AM 04/30/2020    4:19 PM  Depression screen PHQ 2/9  Decreased Interest 0 1 2 3 1   Down, Depressed, Hopeless 0 0 1 2 1   PHQ - 2 Score 0 1 3 5 2   Altered sleeping 0 1 0 1 3  Tired, decreased energy 0 1 1 2 1   Change in appetite 0 1 0 2 1  Feeling bad or failure about yourself  0 0 1 2 0  Trouble concentrating 0 0 0 1 0  Moving slowly or fidgety/restless 0 0 0 0 0  Suicidal thoughts 0 0 0 0 0  PHQ-9 Score 0 4 5 13 7         12/22/2023   10:15 AM 09/17/2021   10:22 AM 05/29/2021   11:22 AM 04/30/2020    4:19 PM  GAD 7 : Generalized Anxiety Score  Nervous, Anxious, on Edge 0 0 2 0  Control/stop worrying 0 0 2 0  Worry too much - different things 0 0 2 0  Trouble relaxing 0 0 2 0  Restless 0 0 1 0  Easily annoyed or irritable 1 2 3 2   Afraid - awful might happen 0 0 1 0  Total GAD 7 Score 1 2 13 2       Review of Systems:   Pertinent items are noted in HPI Denies abnormal vaginal discharge w/ itching/odor/irritation, headaches, visual changes, shortness of breath, chest pain, abdominal pain, severe nausea/vomiting, or problems with urination or bowel movements unless otherwise stated above. Pertinent History  Reviewed:  Reviewed past medical,surgical, social, obstetrical and family history.  Reviewed problem list, medications and allergies. Physical Assessment:   Vitals:   05/09/24 1100  BP: 111/69  Pulse: 85  Weight: 139 lb 8 oz (63.3 kg)  Body mass index is 19.46 kg/m.        Physical Examination:   General appearance: Well appearing, and in no distress  Mental status: Alert, oriented to person, place, and time  Skin: Warm & dry  Cardiovascular: Normal heart rate noted  Respiratory: Normal respiratory effort, no distress  Abdomen: Soft, gravid, nontender  Pelvic: Cervical exam deferred         Extremities:    Fetal Status:     Movement: Present  US : GA = 33+1 weeks Single active female fetus, cephalic, FHR = 138 bpm, anterior pl, gr1, AFI = 16.7 cm, SVP = 6.6 cm, EFW 2056g, 32%,  cx closed, nl ov's  Chaperone: N/A No results found for this or any previous visit (from the past 24 hours).  Assessment & Plan:  1) Low-risk pregnancy G3P2002 at [redacted]w[redacted]d with an Estimated Date of Delivery: 06/26/24   2) Anemia w/ PICA, taking po Fe  as directed, CBC today, if still in 9 range recommend IV Fe, pt ok w/ this, has done in past  3) H/O FGR x 2> EFW today 32%  4) JIA- no meds   Meds: No orders of the defined types were placed in this encounter.  Labs/procedures today: U/S and CBC  Plan:  Continue routine obstetrical care  Next visit: prefers in person    Reviewed: Preterm labor symptoms and general obstetric precautions including but not limited to vaginal bleeding, contractions, leaking of fluid and fetal movement were reviewed in detail with the patient.  All questions were answered. Does have home bp cuff. Office bp cuff given: not applicable. Check bp weekly, let us  know if consistently >140 and/or >90.  Follow-up: Return in about 2 weeks (around 05/23/2024) for LROB, CNM, in person.  Future Appointments  Date Time Provider Department Center  05/23/2024 10:30 AM Jayne Vonn DEL, MD  [facility]  05/30/2024 10:50 AM Kizzie Suzen SAUNDERS, CNM [facility]  06/06/2024 10:00 AM [facility] 2 [facility] None  06/06/2024 11:10 AM Kizzie Suzen SAUNDERS, CNM [facility]  06/13/2024  9:50 AM Kizzie Suzen SAUNDERS, CNM [facility]  06/20/2024  9:50 AM Kizzie Suzen SAUNDERS, CNM [facility]  06/27/2024  9:30 AM [facility] NURSE [facility]  06/27/2024  9:50 AM Newton, Cathlean, CNM [facility]    Orders Placed This Encounter  Procedures   CBC   Suzen SAUNDERS Kizzie CNM, [facility] 05/09/2024 11:47 AM

## 2024-05-09 NOTE — Progress Notes (Signed)
 US : GA = 33+1 weeks Single active female fetus, cephalic, FHR = 138 bpm, anterior pl, gr1, AFI = 16.7 cm, SVP = 6.6 cm, EFW 2056g, 32%,  cx closed, nl ov's

## 2024-05-10 ENCOUNTER — Ambulatory Visit

## 2024-05-10 ENCOUNTER — Encounter: Payer: Self-pay | Admitting: Women's Health

## 2024-05-10 ENCOUNTER — Ambulatory Visit: Payer: Self-pay | Admitting: Women's Health

## 2024-05-10 ENCOUNTER — Other Ambulatory Visit: Payer: Self-pay | Admitting: Women's Health

## 2024-05-10 VITALS — BP 125/74

## 2024-05-10 DIAGNOSIS — O368130 Decreased fetal movements, third trimester, not applicable or unspecified: Secondary | ICD-10-CM | POA: Diagnosis not present

## 2024-05-10 DIAGNOSIS — Z3483 Encounter for supervision of other normal pregnancy, third trimester: Secondary | ICD-10-CM | POA: Insufficient documentation

## 2024-05-10 DIAGNOSIS — Z3A33 33 weeks gestation of pregnancy: Secondary | ICD-10-CM

## 2024-05-10 DIAGNOSIS — R3989 Other symptoms and signs involving the genitourinary system: Secondary | ICD-10-CM | POA: Diagnosis not present

## 2024-05-10 DIAGNOSIS — O99013 Anemia complicating pregnancy, third trimester: Secondary | ICD-10-CM

## 2024-05-10 LAB — POCT URINALYSIS DIPSTICK OB
Glucose, UA: NEGATIVE
Ketones, UA: NEGATIVE
Nitrite, UA: NEGATIVE

## 2024-05-10 LAB — CBC
Hematocrit: 28.8 % — ABNORMAL LOW (ref 34.0–46.6)
Hemoglobin: 9 g/dL — ABNORMAL LOW (ref 11.1–15.9)
MCH: 26.2 pg — ABNORMAL LOW (ref 26.6–33.0)
MCHC: 31.3 g/dL — ABNORMAL LOW (ref 31.5–35.7)
MCV: 84 fL (ref 79–97)
Platelets: 229 x10E3/uL (ref 150–450)
RBC: 3.44 x10E6/uL — ABNORMAL LOW (ref 3.77–5.28)
RDW: 14 % (ref 11.7–15.4)
WBC: 6.1 x10E3/uL (ref 3.4–10.8)

## 2024-05-10 NOTE — Progress Notes (Signed)
   NURSE VISIT- NST  SUBJECTIVE:  Zoii Florer is a 23 y.o. G58P2002 female at [redacted]w[redacted]d, here for a NST for pregnancy complicated by Decreased fetal movement.  She reports decreased  fetal movement, contractions: none, vaginal bleeding: none, membranes: intact.   OBJECTIVE:  BP 125/74 (BP Location: Right Arm, Patient Position: Sitting, Cuff Size: Normal)   LMP 09/20/2023 (Exact Date)   Appears well, no apparent distress  Results for orders placed or performed in visit on 05/10/24 (from the past 24 hours)  POC Urinalysis Dipstick OB   Collection Time: 05/10/24  2:52 PM  Result Value Ref Range   Color, UA     Clarity, UA     Glucose, UA Negative Negative   Bilirubin, UA     Ketones, UA Neg    Spec Grav, UA     Blood, UA Large    pH, UA     POC,PROTEIN,UA Large (3+) Negative, Trace, Small (1+), Moderate (2+), Large (3+), 4+   Urobilinogen, UA     Nitrite, UA Neg    Leukocytes, UA Trace (A) Negative   Appearance     Odor      NST: FHR baseline 135 bpm, Variability: moderate, Accelerations:present, Decelerations:  Absent= Cat 1/reactive Toco: none   ASSESSMENT: G3P2002 at [redacted]w[redacted]d with Decreased fetal movement NST reactive Urine sent for Urine Culture  PLAN: EFM strip reviewed by Luke Fetters, CNM, [facility]   Recommendations: keep next appointment as scheduled    Aleck FORBES Blase  05/10/2024 2:53 PM

## 2024-05-11 ENCOUNTER — Telehealth: Payer: Self-pay

## 2024-05-11 NOTE — Telephone Encounter (Signed)
 Auth Submission: NO AUTH NEEDED Site of care: Site of care: [facility] Payer: [employer]  Medication & CPT/J Code(s) submitted: Venofer  (Iron  Sucrose) J1756 Diagnosis Code:  Route of submission (phone, fax, portal): portal Phone # Fax # Auth type: Buy/Bill HB Units/visits requested: 200mg  x 5 doses Reference number:  Approval from: 05/11/24 to 07/06/24

## 2024-05-12 LAB — URINE CULTURE

## 2024-05-16 ENCOUNTER — Encounter: Attending: Women's Health | Admitting: Emergency Medicine

## 2024-05-16 VITALS — BP 100/66 | HR 75 | Temp 97.8°F | Resp 15

## 2024-05-16 DIAGNOSIS — Z3A00 Weeks of gestation of pregnancy not specified: Secondary | ICD-10-CM | POA: Insufficient documentation

## 2024-05-16 DIAGNOSIS — Z3483 Encounter for supervision of other normal pregnancy, third trimester: Secondary | ICD-10-CM

## 2024-05-16 DIAGNOSIS — O99013 Anemia complicating pregnancy, third trimester: Secondary | ICD-10-CM | POA: Insufficient documentation

## 2024-05-16 MED ORDER — ACETAMINOPHEN 325 MG PO TABS
650.0000 mg | ORAL_TABLET | Freq: Once | ORAL | Status: AC
  Administered 2024-05-16: 650 mg via ORAL

## 2024-05-16 MED ORDER — DIPHENHYDRAMINE HCL 25 MG PO CAPS
25.0000 mg | ORAL_CAPSULE | Freq: Once | ORAL | Status: AC
  Administered 2024-05-16: 25 mg via ORAL

## 2024-05-16 MED ORDER — IRON SUCROSE 20 MG/ML IV SOLN
200.0000 mg | Freq: Once | INTRAVENOUS | Status: AC
  Administered 2024-05-16: 200 mg via INTRAVENOUS

## 2024-05-16 NOTE — Patient Instructions (Addendum)
 SABRA

## 2024-05-16 NOTE — Progress Notes (Signed)
 Diagnosis: Iron  Deficiency Anemia  Provider:  Luke Fetters CNM  Procedure: IV Push  IV Type: Peripheral, IV Location: left wrist  Venofer  (Iron  Sucrose), Dose: 200 mg  Post Infusion IV Care: Observation period completed and Peripheral IV Discontinued  Discharge: Condition: Good, Destination: Home . AVS Provided  Performed by:  Delon ONEIDA Officer, RN

## 2024-05-18 ENCOUNTER — Ambulatory Visit

## 2024-05-18 VITALS — BP 92/61 | HR 73 | Temp 97.6°F | Resp 16

## 2024-05-18 DIAGNOSIS — O99013 Anemia complicating pregnancy, third trimester: Secondary | ICD-10-CM | POA: Diagnosis not present

## 2024-05-18 DIAGNOSIS — Z3483 Encounter for supervision of other normal pregnancy, third trimester: Secondary | ICD-10-CM

## 2024-05-18 MED ORDER — ACETAMINOPHEN 325 MG PO TABS
650.0000 mg | ORAL_TABLET | Freq: Once | ORAL | Status: AC
  Administered 2024-05-18: 650 mg via ORAL

## 2024-05-18 MED ORDER — DIPHENHYDRAMINE HCL 25 MG PO CAPS
25.0000 mg | ORAL_CAPSULE | Freq: Once | ORAL | Status: AC
  Administered 2024-05-18: 25 mg via ORAL

## 2024-05-18 MED ORDER — IRON SUCROSE 20 MG/ML IV SOLN
200.0000 mg | Freq: Once | INTRAVENOUS | Status: AC
  Administered 2024-05-18: 200 mg via INTRAVENOUS

## 2024-05-18 NOTE — Progress Notes (Signed)
 Diagnosis: Iron  Deficiency Anemia  Provider:  Luke Fetters, CNM  Procedure: IV Infusion  IV Type: Peripheral, IV Location: L Hand  Venofer  (Iron  Sucrose), Dose: 200 mg  Infusion Start Time: 1216  Infusion Stop Time: 1226  Post Infusion IV Care: Observation period completed  Discharge: Condition: Good, Destination: Home . AVS Declined  Performed by:  Vaniyah Lansky R, LPN

## 2024-05-20 ENCOUNTER — Encounter: Admitting: Internal Medicine

## 2024-05-20 VITALS — BP 91/58 | HR 82 | Temp 97.7°F | Resp 16

## 2024-05-20 DIAGNOSIS — Z3483 Encounter for supervision of other normal pregnancy, third trimester: Secondary | ICD-10-CM

## 2024-05-20 DIAGNOSIS — O99013 Anemia complicating pregnancy, third trimester: Secondary | ICD-10-CM | POA: Diagnosis not present

## 2024-05-20 MED ORDER — ACETAMINOPHEN 325 MG PO TABS
650.0000 mg | ORAL_TABLET | Freq: Once | ORAL | Status: AC
  Administered 2024-05-20: 650 mg via ORAL

## 2024-05-20 MED ORDER — DIPHENHYDRAMINE HCL 25 MG PO CAPS
25.0000 mg | ORAL_CAPSULE | Freq: Once | ORAL | Status: AC
  Administered 2024-05-20: 25 mg via ORAL

## 2024-05-20 MED ORDER — IRON SUCROSE 20 MG/ML IV SOLN
200.0000 mg | Freq: Once | INTRAVENOUS | Status: AC
  Administered 2024-05-20: 200 mg via INTRAVENOUS

## 2024-05-20 NOTE — Progress Notes (Signed)
 Diagnosis: Iron  Deficiency Anemia  Provider:  Luke Fetters, CNM  Procedure: IV Push  IV Type: Peripheral, IV Location: Left Posterior hand  Venofer  (Iron  Sucrose), Dose: 200 mg Patient only got 8 ml. Patient started complaining arm was hurting from hand to elbow. No infiltration was noted. Flushed IV. DC IV and took vitals, vitals where normal. Patient stayed observation period and was released.  Post Infusion IV Care: Observation period completed  Discharge: Condition: Good, Destination: Home . AVS Declined  Performed by:  Danyka Merlin R, LPN

## 2024-05-23 ENCOUNTER — Ambulatory Visit

## 2024-05-23 ENCOUNTER — Encounter: Admitting: Internal Medicine

## 2024-05-23 ENCOUNTER — Encounter: Payer: Self-pay | Admitting: Obstetrics & Gynecology

## 2024-05-23 ENCOUNTER — Ambulatory Visit (INDEPENDENT_AMBULATORY_CARE_PROVIDER_SITE_OTHER): Admitting: Obstetrics & Gynecology

## 2024-05-23 VITALS — BP 90/57 | HR 77 | Temp 98.3°F

## 2024-05-23 VITALS — BP 107/70 | HR 101 | Wt 144.0 lb

## 2024-05-23 DIAGNOSIS — O99013 Anemia complicating pregnancy, third trimester: Secondary | ICD-10-CM | POA: Diagnosis not present

## 2024-05-23 DIAGNOSIS — Z3A35 35 weeks gestation of pregnancy: Secondary | ICD-10-CM

## 2024-05-23 DIAGNOSIS — Z3483 Encounter for supervision of other normal pregnancy, third trimester: Secondary | ICD-10-CM | POA: Diagnosis not present

## 2024-05-23 DIAGNOSIS — Z3480 Encounter for supervision of other normal pregnancy, unspecified trimester: Secondary | ICD-10-CM

## 2024-05-23 LAB — POCT URINALYSIS DIPSTICK OB
Blood, UA: NEGATIVE
Glucose, UA: NEGATIVE
Ketones, UA: NEGATIVE
Leukocytes, UA: NEGATIVE
Nitrite, UA: NEGATIVE
POC,PROTEIN,UA: NEGATIVE

## 2024-05-23 MED ORDER — ACETAMINOPHEN 325 MG PO TABS
650.0000 mg | ORAL_TABLET | Freq: Once | ORAL | Status: AC
  Administered 2024-05-23: 650 mg via ORAL

## 2024-05-23 MED ORDER — DIPHENHYDRAMINE HCL 25 MG PO CAPS
25.0000 mg | ORAL_CAPSULE | Freq: Once | ORAL | Status: AC
  Administered 2024-05-23: 25 mg via ORAL

## 2024-05-23 MED ORDER — SODIUM CHLORIDE 0.9 % IV SOLN
200.0000 mg | Freq: Once | INTRAVENOUS | Status: AC
  Administered 2024-05-23: 200 mg via INTRAVENOUS
  Filled 2024-05-23: qty 10

## 2024-05-23 MED ORDER — IRON SUCROSE 200 MG IVPB - SIMPLE MED: 200.0000 mg | Freq: Once | Status: DC

## 2024-05-23 NOTE — Progress Notes (Signed)
 LOW-RISK PREGNANCY VISIT Patient name: Caitlyn Green MRN [id number]  Date of birth: 07/15/2000 Chief Complaint:   Routine Prenatal Visit  History of Present Illness:   Caitlyn Green is a 23 y.o. G61P2002 female at [redacted]w[redacted]d with an Estimated Date of Delivery: 06/26/24 being seen today for ongoing management of a low-risk pregnancy.     05/20/2024   11:45 AM 05/18/2024   11:34 AM 05/16/2024    1:32 PM 04/11/2024   11:39 AM 12/22/2023   10:14 AM  Depression screen PHQ 2/9  Decreased Interest 0 0 0 0 1  Down, Depressed, Hopeless 0 0 0 0 0  PHQ - 2 Score 0 0 0 0 1  Altered sleeping  0  0 1  Tired, decreased energy  0  0 1  Change in appetite    0 1  Feeling bad or failure about yourself   0  0 0  Trouble concentrating  0  0 0  Moving slowly or fidgety/restless  0  0 0  Suicidal thoughts  0  0 0  PHQ-9 Score  0  0  4      Data saved with a previous flowsheet row definition    Today she reports pelvic pressure. Contractions: Not present. Vag. Bleeding: None.  Movement: Present. denies leaking of fluid. Review of Systems:   Pertinent items are noted in HPI Denies abnormal vaginal discharge w/ itching/odor/irritation, headaches, visual changes, shortness of breath, chest pain, abdominal pain, severe nausea/vomiting, or problems with urination or bowel movements unless otherwise stated above. Pertinent History Reviewed:  Reviewed past medical,surgical, social, obstetrical and family history.  Reviewed problem list, medications and allergies. Physical Assessment:   Vitals:   05/23/24 1026  BP: 107/70  Pulse: (!) 101  Weight: 144 lb (65.3 kg)  Body mass index is 20.08 kg/m.        Physical Examination:   General appearance: Well appearing, and in no distress  Mental status: Alert, oriented to person, place, and time  Skin: Warm & dry  Cardiovascular: Normal heart rate noted  Respiratory: Normal respiratory effort, no distress  Abdomen: Soft, gravid, nontender  Pelvic:  Cervical exam deferred         Extremities:    Fetal Status:     Movement: Present    Chaperone: n/a    Results for orders placed or performed in visit on 05/23/24 (from the past 24 hours)  POC Urinalysis Dipstick OB   Collection Time: 05/23/24 10:38 AM  Result Value Ref Range   Color, UA     Clarity, UA     Glucose, UA Negative Negative   Bilirubin, UA     Ketones, UA Neg    Spec Grav, UA     Blood, UA Neg    pH, UA     POC,PROTEIN,UA Negative Negative, Trace, Small (1+), Moderate (2+), Large (3+), 4+   Urobilinogen, UA     Nitrite, UA Neg    Leukocytes, UA Negative Negative   Appearance     Odor      Assessment & Plan:  1) Low-risk pregnancy G3P2002 at [redacted]w[redacted]d with an Estimated Date of Delivery: 06/26/24   2) Hx of FGR x 2, this baby EFW 32%,    Meds: No orders of the defined types were placed in this encounter.  Labs/procedures today:   Plan:  Continue routine obstetrical care  Next visit: prefers in person      Follow-up: Return in about 2 weeks (around 06/06/2024).  Orders Placed This Encounter  Procedures   POC Urinalysis Dipstick OB    Vonn VEAR Inch, MD 05/23/2024 11:05 AM

## 2024-05-23 NOTE — Progress Notes (Signed)
 Diagnosis: Iron  Deficiency Anemia  Provider:  Luke Kizzie HOWARD  Procedure: IV Infusion  IV Type: Peripheral, IV Location: Posterior right hand  Venofer  (Iron  Sucrose), Dose: 200 mg  Infusion Start Time: 1508  Infusion Stop Time: 1526  Post Infusion IV Care: Observation period completed  Discharge: Condition: Good, Destination: Home . AVS Declined  Performed by:  Cledis Sohn R, LPN

## 2024-05-25 ENCOUNTER — Ambulatory Visit

## 2024-05-25 VITALS — BP 95/64 | HR 67 | Temp 98.2°F | Resp 16

## 2024-05-25 DIAGNOSIS — Z3483 Encounter for supervision of other normal pregnancy, third trimester: Secondary | ICD-10-CM

## 2024-05-25 DIAGNOSIS — O99013 Anemia complicating pregnancy, third trimester: Secondary | ICD-10-CM

## 2024-05-25 MED ORDER — SODIUM CHLORIDE 0.9 % IV SOLN
200.0000 mg | Freq: Once | INTRAVENOUS | Status: AC
  Administered 2024-05-25: 200 mg via INTRAVENOUS
  Filled 2024-05-25: qty 10

## 2024-05-25 MED ORDER — ACETAMINOPHEN 325 MG PO TABS
650.0000 mg | ORAL_TABLET | Freq: Once | ORAL | Status: AC
  Administered 2024-05-25: 650 mg via ORAL

## 2024-05-25 MED ORDER — DIPHENHYDRAMINE HCL 25 MG PO CAPS: 25.0000 mg | ORAL_CAPSULE | Freq: Once | ORAL | Status: DC

## 2024-05-25 MED ORDER — DIPHENHYDRAMINE HCL 25 MG PO CAPS
25.0000 mg | ORAL_CAPSULE | Freq: Once | ORAL | Status: AC
  Administered 2024-05-25: 25 mg via ORAL

## 2024-05-25 MED ORDER — ACETAMINOPHEN 325 MG PO TABS: 650.0000 mg | ORAL_TABLET | Freq: Once | ORAL | Status: DC

## 2024-05-25 NOTE — Addendum Note (Signed)
 Addended by: BLANCA SELINDA SAUNDERS on: 05/25/2024 01:18 PM   Modules accepted: Orders

## 2024-05-25 NOTE — Progress Notes (Signed)
 Diagnosis: Iron  Deficiency Anemia  Provider:  Luke Fetters  Procedure: IV Infusion  IV Type: Peripheral, IV Location: R Hand  Venofer  (Iron  Sucrose), Dose: 200 mg  Infusion Start Time: 1217  Infusion Stop Time: 1232  Post Infusion IV Care: Observation period completed  Discharge: Condition: Good, Destination: Home . AVS Declined  Performed by:  Gurnoor Sloop R, LPN

## 2024-05-30 ENCOUNTER — Encounter: Admitting: Women's Health

## 2024-05-31 ENCOUNTER — Other Ambulatory Visit: Payer: Self-pay | Admitting: Obstetrics & Gynecology

## 2024-05-31 DIAGNOSIS — O99019 Anemia complicating pregnancy, unspecified trimester: Secondary | ICD-10-CM

## 2024-05-31 DIAGNOSIS — Z8759 Personal history of other complications of pregnancy, childbirth and the puerperium: Secondary | ICD-10-CM

## 2024-05-31 DIAGNOSIS — M0880 Other juvenile arthritis, unspecified site: Secondary | ICD-10-CM

## 2024-06-06 ENCOUNTER — Ambulatory Visit: Admitting: Women's Health

## 2024-06-06 ENCOUNTER — Ambulatory Visit: Admitting: Radiology

## 2024-06-06 ENCOUNTER — Other Ambulatory Visit (HOSPITAL_COMMUNITY)
Admission: RE | Admit: 2024-06-06 | Discharge: 2024-06-06 | Disposition: A | Source: Ambulatory Visit | Attending: Women's Health | Admitting: Women's Health

## 2024-06-06 ENCOUNTER — Encounter: Payer: Self-pay | Admitting: Women's Health

## 2024-06-06 VITALS — BP 118/76 | HR 82 | Wt 143.0 lb

## 2024-06-06 DIAGNOSIS — Z8759 Personal history of other complications of pregnancy, childbirth and the puerperium: Secondary | ICD-10-CM

## 2024-06-06 DIAGNOSIS — Z3A37 37 weeks gestation of pregnancy: Secondary | ICD-10-CM | POA: Insufficient documentation

## 2024-06-06 DIAGNOSIS — O99013 Anemia complicating pregnancy, third trimester: Secondary | ICD-10-CM

## 2024-06-06 DIAGNOSIS — Z3483 Encounter for supervision of other normal pregnancy, third trimester: Secondary | ICD-10-CM

## 2024-06-06 DIAGNOSIS — Z3480 Encounter for supervision of other normal pregnancy, unspecified trimester: Secondary | ICD-10-CM

## 2024-06-06 DIAGNOSIS — M0880 Other juvenile arthritis, unspecified site: Secondary | ICD-10-CM

## 2024-06-06 DIAGNOSIS — O99019 Anemia complicating pregnancy, unspecified trimester: Secondary | ICD-10-CM

## 2024-06-06 LAB — POCT URINALYSIS DIPSTICK OB
Blood, UA: NEGATIVE
Glucose, UA: NEGATIVE
Ketones, UA: NEGATIVE
Nitrite, UA: NEGATIVE
POC,PROTEIN,UA: NEGATIVE

## 2024-06-06 LAB — POCT HEMOGLOBIN: Hemoglobin: 8.5 g/dL — AB (ref 11–14.6)

## 2024-06-06 NOTE — Progress Notes (Signed)
 LOW-RISK PREGNANCY VISIT Patient name: Caitlyn Green MRN [id number]  Date of birth: [birth year] Chief Complaint:   Routine Prenatal Visit  History of Present Illness:   Caitlyn Green is a 23 y.o. G50P2002 female at [redacted]w[redacted]d with an Estimated Date of Delivery: 06/26/24 being seen today for ongoing management of a low-risk pregnancy.   Today she reports some Braxton Hicks, sharper pain Lt side during contraction. No longer craves ice/dirt. Finished IV Fe x 5. Contractions: Irritability. Vag. Bleeding: None.  Movement: Present. denies leaking of fluid.     05/20/2024   11:45 AM 05/18/2024   11:34 AM 05/16/2024    1:32 PM 04/11/2024   11:39 AM 12/22/2023   10:14 AM  Depression screen PHQ 2/9  Decreased Interest 0 0 0 0 1  Down, Depressed, Hopeless 0 0 0 0 0  PHQ - 2 Score 0 0 0 0 1  Altered sleeping  0  0 1  Tired, decreased energy  0  0 1  Change in appetite    0 1  Feeling bad or failure about yourself   0  0 0  Trouble concentrating  0  0 0  Moving slowly or fidgety/restless  0  0 0  Suicidal thoughts  0  0 0  PHQ-9 Score  0  0  4      Data saved with a previous flowsheet row definition        12/22/2023   10:15 AM 09/17/2021   10:22 AM 05/29/2021   11:22 AM 04/30/2020    4:19 PM  GAD 7 : Generalized Anxiety Score  Nervous, Anxious, on Edge 0 0 2 0  Control/stop worrying 0 0 2 0  Worry too much - different things 0 0 2 0  Trouble relaxing 0 0 2 0  Restless 0 0 1 0  Easily annoyed or irritable 1 2 3 2   Afraid - awful might happen 0 0 1 0  Total GAD 7 Score 1 2 13 2       Review of Systems:   Pertinent items are noted in HPI Denies abnormal vaginal discharge w/ itching/odor/irritation, headaches, visual changes, shortness of breath, chest pain, abdominal pain, severe nausea/vomiting, or problems with urination or bowel movements unless otherwise stated above. Pertinent History Reviewed:  Reviewed past medical,surgical, social, obstetrical and family history.   Reviewed problem list, medications and allergies. Physical Assessment:   Vitals:   06/06/24 1036  BP: 118/76  Pulse: 82  Weight: 143 lb (64.9 kg)  Body mass index is 19.94 kg/m.        Physical Examination:   General appearance: Well appearing, and in no distress  Mental status: Alert, oriented to person, place, and time  Skin: Warm & dry  Cardiovascular: Normal heart rate noted  Respiratory: Normal respiratory effort, no distress  Abdomen: Soft, gravid, nontender  Pelvic: Cervical exam performed has multiple perianal/vulvar skin tags/?condyloma states just popped up recently Dilation: 2.5 Effacement (%): 50 Station: -2  Extremities:    Fetal Status:     Movement: Present Presentation: Vertex US : GA = 37+1 weeks Single active female fetus, Cephalic, FHR = 140 bpm, anterior pl, gr1, AFI = 12.9 cm, MVP = 3.8 cm, EFW 2909g, 35%,  limited view cx  Chaperone: Aleck Blase Results for orders placed or performed in visit on 06/06/24 (from the past 24 hours)  POC Urinalysis Dipstick OB   Collection Time: 06/06/24 10:40 AM  Result Value Ref Range   Color, UA  Clarity, UA     Glucose, UA Negative Negative   Bilirubin, UA     Ketones, UA Neg    Spec Grav, UA     Blood, UA Neg    pH, UA     POC,PROTEIN,UA Negative Negative, Trace, Small (1+), Moderate (2+), Large (3+), 4+   Urobilinogen, UA     Nitrite, UA Neg    Leukocytes, UA Trace (A) Negative   Appearance     Odor    POCT hemoglobin   Collection Time: 06/06/24 11:28 AM  Result Value Ref Range   Hemoglobin 8.5 (A) 11 - 14.6 g/dL    Assessment & Plan:  1) Low-risk pregnancy G3P2002 at [redacted]w[redacted]d with an Estimated Date of Delivery: 06/26/24   2) H/O FGR x 2, EFW today 35%  3) JIA> no meds  4) Anemia> s/p IV Fe x 5, no longer craving ice/dirt, taking po Fe, fingerstick hgb today 8.5, will check CBC   Meds: No orders of the defined types were placed in this encounter.  Labs/procedures today: SVE, U/S, and fingerstick  hgb  Plan:  Continue routine obstetrical care  Next visit: prefers in person    Reviewed: Term labor symptoms and general obstetric precautions including but not limited to vaginal bleeding, contractions, leaking of fluid and fetal movement were reviewed in detail with the patient.  All questions were answered. Does have home bp cuff. Office bp cuff given: not applicable. Check bp weekly, let us  know if consistently >140 and/or >90.  Follow-up: Return for As scheduled.  Future Appointments  Date Time Provider Department Center  06/13/2024  9:50 AM Kizzie Suzen SAUNDERS, CNM [facility]  06/20/2024  9:50 AM Kizzie Suzen SAUNDERS, CNM [facility]  06/27/2024  9:30 AM [facility] NURSE [facility]  06/27/2024  9:50 AM Cresenzo-Dishmon, Cathlean, CNM [facility]    Orders Placed This Encounter  Procedures   Culture, beta strep (group b only)   CBC   POC Urinalysis Dipstick OB   POCT hemoglobin   Suzen SAUNDERS Kizzie CNM, [facility] 06/06/2024 11:48 AM

## 2024-06-06 NOTE — Progress Notes (Signed)
 US : GA = 37+1 weeks Single active female fetus, Cephalic, FHR = 140 bpm, anterior pl, gr1, AFI = 12.9 cm, MVP = 3.8 cm, EFW 2909g, 35%,  limited view cx

## 2024-06-06 NOTE — Patient Instructions (Signed)
 Caitlyn Green, thank you for choosing our office today! We appreciate the opportunity to meet your healthcare needs. You may receive a short survey by mail, e-mail, or through [employer]. If you are happy with your care we would appreciate if you could take just a few minutes to complete the survey questions. We read all of your comments and take your feedback very seriously. Thank you again for choosing our office.  Center for [employer] Team at [facility]  [facility] at [facility] ([address] [place], [place] [address]) Entrance C, located off of [address] Free 24/7 valet parking   CLASSES: Go to [email or web address] to register for classes (childbirth, breastfeeding, waterbirth, infant CPR, daddy bootcamp, etc.)  Call the office [phone]) or go to [facility] if: You begin to have strong, frequent contractions Your water breaks.  Sometimes it is a big gush of fluid, sometimes it is just a trickle that keeps getting your panties wet or running down your legs You have vaginal bleeding.  It is normal to have a small amount of spotting if your cervix was checked.  You don't feel your baby moving like normal.  If you don't, get you something to eat and drink and lay down and focus on feeling your baby move.   If your baby is still not moving like normal, you should call the office or go to [facility].  Call the office [phone]) or go to [facility] hospital for these signs of pre-eclampsia: Severe headache that does not go away with Tylenol  Visual changes- seeing spots, double, blurred vision Pain under your right breast or upper abdomen that does not go away with Tums or heartburn medicine Nausea and/or vomiting Severe swelling in your hands, feet, and face   [place] Pediatricians/Family Doctors [facility]): [address] Luba BROCKS, [phone]           [facility]: [address], [phone]                 [facility]): [address], [phone] (call to ask if accepting patients) [facility] Department: [address], [place], [phone]    [facility] Pediatricians/Family Doctors [facility]): [address], [phone] [facility]: [address] [place], [phone] [facility] [place]: [address], [phone]  [facility] Doctors  [facility]): [phone] [facility]: [address] [phone]   [facility] Doctors [facility]: [address] [phone]  [facility] Doctors  [facility]: [phone], [phone]  Home Blood Pressure Monitoring for Patients   Your provider has recommended that you check your blood pressure (BP) at least once a week at home. If you do not have a blood pressure cuff at home, one will be provided for you. Contact your provider if you have not received your monitor within 1 week.   Helpful Tips for Accurate Home Blood Pressure Checks  Don't smoke, exercise, or drink caffeine  30 minutes before checking your BP Use the restroom before checking your BP (a full bladder can raise your pressure) Relax in a comfortable upright chair Feet on the ground Left arm resting comfortably on a flat surface at the level of your heart Legs uncrossed Back supported Sit quietly and don't talk Place the cuff on your bare arm Adjust snuggly, so that only two fingertips  can fit between your skin and the top of the cuff Check 2 readings separated by at least one minute Keep a log of your BP readings For a visual, please reference this diagram: http://ccnc.care/bpdiagram  Provider Name: [facility]     Phone: [phone]  Zone 1: ALL CLEAR  Continue to monitor your symptoms:  BP reading is less than 140 (top number) or less than 90 (bottom number)  No right  upper stomach pain No headaches or seeing spots No feeling nauseated or throwing up No swelling in face and hands  Zone 2: CAUTION Call your doctor's office for any of the following:  BP reading is greater than 140 (top number) or greater than 90 (bottom number)  Stomach pain under your ribs in the middle or right side Headaches or seeing spots Feeling nauseated or throwing up Swelling in face and hands  Zone 3: EMERGENCY  Seek immediate medical care if you have any of the following:  BP reading is greater than160 (top number) or greater than 110 (bottom number) Severe headaches not improving with Tylenol  Serious difficulty catching your breath Any worsening symptoms from Zone 2   Braxton Hicks Contractions Contractions of the uterus can occur throughout pregnancy, but they are not always a sign that you are in labor. You may have practice contractions called Braxton Hicks contractions. These false labor contractions are sometimes confused with true labor. What are Darol Irving contractions? Braxton Hicks contractions are tightening movements that occur in the muscles of the uterus before labor. Unlike true labor contractions, these contractions do not result in opening (dilation) and thinning of the cervix. Toward the end of pregnancy (32-34 weeks), Braxton Hicks contractions can happen more often and may become stronger. These contractions are sometimes difficult to tell apart from true labor because they can be very uncomfortable. You should not feel embarrassed if you go to the hospital with false labor. Sometimes, the only way to tell if you are in true labor is for your health care provider to look for changes in the cervix. The health care provider will do a physical exam and may monitor your contractions. If you are not in true labor, the exam should show that your cervix is not dilating and your water has not broken. If there are no other health problems associated with your  pregnancy, it is completely safe for you to be sent home with false labor. You may continue to have Braxton Hicks contractions until you go into true labor. How to tell the difference between true labor and false labor True labor Contractions last 30-70 seconds. Contractions become very regular. Discomfort is usually felt in the top of the uterus, and it spreads to the lower abdomen and low back. Contractions do not go away with walking. Contractions usually become more intense and increase in frequency. The cervix dilates and gets thinner. False labor Contractions are usually shorter and not as strong as true labor contractions. Contractions are usually irregular. Contractions are often felt in the front of the lower abdomen and in the groin. Contractions may go away when you walk around or change positions while lying down. Contractions get weaker and are shorter-lasting as time goes on. The cervix usually does not dilate or become thin. Follow these instructions at home:  Take over-the-counter and prescription medicines only as told by your health care provider. Keep up with your usual exercises and follow other instructions from your health care provider. Eat and drink lightly if you think  you are going into labor. If Braxton Hicks contractions are making you uncomfortable: Change your position from lying down or resting to walking, or change from walking to resting. Sit and rest in a tub of warm water. Drink enough fluid to keep your urine pale yellow. Dehydration may cause these contractions. Do slow and deep breathing several times an hour. Keep all follow-up prenatal visits as told by your health care provider. This is important. Contact a health care provider if: You have a fever. You have continuous pain in your abdomen. Get help right away if: Your contractions become stronger, more regular, and closer together. You have fluid leaking or gushing from your vagina. You pass  blood-tinged mucus (bloody show). You have bleeding from your vagina. You have low back pain that you never had before. You feel your baby's head pushing down and causing pelvic pressure. Your baby is not moving inside you as much as it used to. Summary Contractions that occur before labor are called Braxton Hicks contractions, false labor, or practice contractions. Braxton Hicks contractions are usually shorter, weaker, farther apart, and less regular than true labor contractions. True labor contractions usually become progressively stronger and regular, and they become more frequent. Manage discomfort from [facility] contractions by changing position, resting in a warm bath, drinking plenty of water, or practicing deep breathing. This information is not intended to replace advice given to you by your health care provider. Make sure you discuss any questions you have with your health care provider. Document Revised: 06/05/2017 Document Reviewed: 11/06/2016 Elsevier Patient Education  2020 [employer].

## 2024-06-07 ENCOUNTER — Ambulatory Visit: Payer: Self-pay | Admitting: Women's Health

## 2024-06-07 LAB — CERVICOVAGINAL ANCILLARY ONLY
Chlamydia: NEGATIVE
Comment: NEGATIVE
Comment: NORMAL
Neisseria Gonorrhea: NEGATIVE

## 2024-06-07 LAB — CBC
Hematocrit: 32.6 % — ABNORMAL LOW (ref 34.0–46.6)
Hemoglobin: 10.6 g/dL — ABNORMAL LOW (ref 11.1–15.9)
MCH: 27.7 pg (ref 26.6–33.0)
MCHC: 32.5 g/dL (ref 31.5–35.7)
MCV: 85 fL (ref 79–97)
Platelets: 239 x10E3/uL (ref 150–450)
RBC: 3.82 x10E6/uL (ref 3.77–5.28)
RDW: 18.7 % — ABNORMAL HIGH (ref 11.7–15.4)
WBC: 5.1 x10E3/uL (ref 3.4–10.8)

## 2024-06-10 LAB — CULTURE, BETA STREP (GROUP B ONLY)

## 2024-06-13 ENCOUNTER — Inpatient Hospital Stay (HOSPITAL_COMMUNITY): Admitting: Anesthesiology

## 2024-06-13 ENCOUNTER — Encounter: Payer: Self-pay | Admitting: Women's Health

## 2024-06-13 ENCOUNTER — Inpatient Hospital Stay (HOSPITAL_COMMUNITY)
Admission: AD | Admit: 2024-06-13 | Discharge: 2024-06-15 | DRG: 807 | Disposition: A | Attending: Family Medicine | Admitting: Family Medicine

## 2024-06-13 ENCOUNTER — Encounter (HOSPITAL_COMMUNITY): Payer: Self-pay | Admitting: Obstetrics & Gynecology

## 2024-06-13 ENCOUNTER — Other Ambulatory Visit: Payer: Self-pay

## 2024-06-13 ENCOUNTER — Ambulatory Visit: Admitting: Women's Health

## 2024-06-13 VITALS — BP 113/77 | HR 76 | Wt 147.2 lb

## 2024-06-13 DIAGNOSIS — Z3483 Encounter for supervision of other normal pregnancy, third trimester: Secondary | ICD-10-CM

## 2024-06-13 DIAGNOSIS — Z3A38 38 weeks gestation of pregnancy: Secondary | ICD-10-CM

## 2024-06-13 DIAGNOSIS — O99013 Anemia complicating pregnancy, third trimester: Principal | ICD-10-CM

## 2024-06-13 DIAGNOSIS — O4202 Full-term premature rupture of membranes, onset of labor within 24 hours of rupture: Secondary | ICD-10-CM | POA: Diagnosis not present

## 2024-06-13 LAB — CBC
HCT: 35.4 % — ABNORMAL LOW (ref 36.0–46.0)
Hemoglobin: 10.9 g/dL — ABNORMAL LOW (ref 12.0–15.0)
MCH: 27.4 pg (ref 26.0–34.0)
MCHC: 30.8 g/dL (ref 30.0–36.0)
MCV: 88.9 fL (ref 80.0–100.0)
Platelets: 215 K/uL (ref 150–400)
RBC: 3.98 MIL/uL (ref 3.87–5.11)
RDW: 21.2 % — ABNORMAL HIGH (ref 11.5–15.5)
WBC: 4.5 K/uL (ref 4.0–10.5)
nRBC: 0 % (ref 0.0–0.2)

## 2024-06-13 LAB — RUPTURE OF MEMBRANE (ROM)PLUS: Rom Plus: POSITIVE

## 2024-06-13 LAB — TYPE AND SCREEN
ABO/RH(D): A POS
Antibody Screen: NEGATIVE

## 2024-06-13 MED ORDER — TERBUTALINE SULFATE 1 MG/ML IJ SOLN: 0.2500 mg | Freq: Once | INTRAMUSCULAR | Status: DC | PRN

## 2024-06-13 MED ORDER — EPHEDRINE 5 MG/ML INJ: 10.0000 mg | INTRAVENOUS | Status: DC | PRN

## 2024-06-13 MED ORDER — LACTATED RINGERS IV SOLN
500.0000 mL | INTRAVENOUS | Status: DC | PRN
  Administered 2024-06-13: 1000 mL via INTRAVENOUS

## 2024-06-13 MED ORDER — LIDOCAINE HCL (PF) 1 % IJ SOLN
INTRAMUSCULAR | Status: DC | PRN
  Administered 2024-06-13: 11 mL via EPIDURAL

## 2024-06-13 MED ORDER — OXYCODONE-ACETAMINOPHEN 5-325 MG PO TABS: 1.0000 | ORAL_TABLET | ORAL | Status: DC | PRN

## 2024-06-13 MED ORDER — FENTANYL-BUPIVACAINE-NACL 0.5-0.125-0.9 MG/250ML-% EP SOLN
12.0000 mL/h | EPIDURAL | Status: DC | PRN
  Administered 2024-06-13: 12 mL/h via EPIDURAL
  Filled 2024-06-13: qty 250

## 2024-06-13 MED ORDER — OXYCODONE-ACETAMINOPHEN 5-325 MG PO TABS: 2.0000 | ORAL_TABLET | ORAL | Status: DC | PRN

## 2024-06-13 MED ORDER — PHENYLEPHRINE 80 MCG/ML (10ML) SYRINGE FOR IV PUSH (FOR BLOOD PRESSURE SUPPORT): 80.0000 ug | PREFILLED_SYRINGE | INTRAVENOUS | Status: DC | PRN

## 2024-06-13 MED ORDER — FENTANYL CITRATE (PF) 100 MCG/2ML IJ SOLN: 50.0000 ug | INTRAMUSCULAR | Status: DC | PRN

## 2024-06-13 MED ORDER — LIDOCAINE HCL (PF) 1 % IJ SOLN: 30.0000 mL | INTRAMUSCULAR | Status: DC | PRN

## 2024-06-13 MED ORDER — ONDANSETRON HCL 4 MG/2ML IJ SOLN: 4.0000 mg | Freq: Four times a day (QID) | INTRAMUSCULAR | Status: DC | PRN

## 2024-06-13 MED ORDER — OXYTOCIN-SODIUM CHLORIDE 30-0.9 UT/500ML-% IV SOLN
1.0000 m[IU]/min | INTRAVENOUS | Status: DC
  Administered 2024-06-13: 2 m[IU]/min via INTRAVENOUS

## 2024-06-13 MED ORDER — LACTATED RINGERS IV SOLN
500.0000 mL | Freq: Once | INTRAVENOUS | Status: AC
  Administered 2024-06-13: 500 mL via INTRAVENOUS

## 2024-06-13 MED ORDER — LACTATED RINGERS IV SOLN: INTRAVENOUS | Status: DC

## 2024-06-13 MED ORDER — OXYTOCIN BOLUS FROM INFUSION
333.0000 mL | Freq: Once | INTRAVENOUS | Status: AC
  Administered 2024-06-13: 333 mL via INTRAVENOUS

## 2024-06-13 MED ORDER — ACETAMINOPHEN 325 MG PO TABS: 650.0000 mg | ORAL_TABLET | ORAL | Status: DC | PRN

## 2024-06-13 MED ORDER — OXYTOCIN-SODIUM CHLORIDE 30-0.9 UT/500ML-% IV SOLN
2.5000 [IU]/h | INTRAVENOUS | Status: DC
  Filled 2024-06-13: qty 500

## 2024-06-13 MED ORDER — DIPHENHYDRAMINE HCL 50 MG/ML IJ SOLN: 12.5000 mg | INTRAMUSCULAR | Status: DC | PRN

## 2024-06-13 MED ORDER — SOD CITRATE-CITRIC ACID 500-334 MG/5ML PO SOLN: 30.0000 mL | ORAL | Status: DC | PRN

## 2024-06-13 NOTE — MAU Note (Signed)
 Caitlyn Green is a 22 y.o. at [redacted]w[redacted]d here in MAU reporting: ? Leaking fluid since 6 am. Was seen in office and fern slide was negative and she was sent over for swab to determine if ROM. Reports she started contracting when she arrived here. SVE in office 3/75. Reports positive fetal movement.   Onset of complaint: 6 am Pain score: 3/10 There were no vitals filed for this visit.    Lab orders placed from triage: rom

## 2024-06-13 NOTE — MAU Provider Note (Signed)
 Chief Complaint:  Rupture of Membranes and Contractions   HPI    KADYNCE BONDS is a 23 y.o. G3P2002 at [redacted]w[redacted]d who presents to maternity admissions reporting LOF since 0600. Seen in office today . Patient's Fern slide was negative but patient reports she is still leaking. ROM Plus sent SVE in office was 3/75 Patient denies any VB, reports ctx's and FM's    [facility]   Past Medical History:  Diagnosis Date   Back strain    Rheumatoid arthritis (HCC)    OB History  Gravida Para Term Preterm AB Living  3 2 2  0 0 2  SAB IAB Ectopic Multiple Live Births  0 0 0 0 2    # Outcome Date GA Lbr Len/2nd Weight Sex Type Anes PTL Lv  3 Current           2 Term 11/28/21 [redacted]w[redacted]d 19:25 / 00:11 2700 g F Vag-Spont EPI  LIV     Birth Comments: wnl  1 Term 10/25/20 [redacted]w[redacted]d  2523 g M Vag-Spont EPI N LIV     Complications: IUGR (intrauterine growth restriction) affecting care of mother   Past Surgical History:  Procedure Laterality Date   NO PAST SURGERIES     Family History  Problem Relation Age of Onset   Arthritis Maternal Grandmother    Cancer Father    Healthy Mother    Healthy Brother    Healthy Sister    Healthy Sister    Social History   Tobacco Use   Smoking status: Never   Smokeless tobacco: Never  Vaping Use   Vaping status: Some Days  Substance Use Topics   Alcohol use: Not Currently   Drug use: No   No Known Allergies Facility-Administered Medications Prior to Admission  Medication Dose Route Frequency Provider Last Rate Last Admin   acetaminophen  (TYLENOL ) tablet 650 mg  650 mg Oral Once Kizzie Suzen SAUNDERS, CNM       diphenhydrAMINE  (BENADRYL ) capsule 25 mg  25 mg Oral Once Booker, Kimberly R, CNM       Medications Prior to Admission  Medication Sig Dispense Refill Last Dose/Taking   ferrous sulfate  325 (65 FE) MG tablet Take 1 tablet (325 mg total) by mouth every other day. 45 tablet 2 Past Week   Prenatal Vit-Fe Fumarate-FA  (MULTIVITAMIN-PRENATAL) 27-0.8 MG TABS tablet Take 1 tablet by mouth daily at 12 noon.   06/12/2024   Blood Pressure Monitoring (BLOOD PRESSURE CUFF) MISC 1 kit by Does not apply route as directed. 1 each 0    ondansetron  (ZOFRAN ) 4 MG tablet Take 1 tablet (4 mg total) by mouth every 8 (eight) hours as needed. 30 tablet 1     I have reviewed patient's Past Medical Hx, Surgical Hx, Family Hx, Social Hx, medications and allergies.   ROS  Pertinent items noted in HPI and remainder of comprehensive ROS otherwise negative.   PHYSICAL EXAM  Patient Vitals for the past 24 hrs:  BP Temp Temp src Pulse Resp  06/13/24 1415 107/65 98.1 F (36.7 C) Oral 70 16    Constitutional: Well-developed, well-nourished female in no acute distress.  Cardiovascular: normal rate & rhythm, warm and well-perfused Respiratory: normal effort, no problems with respiration noted GI: Abd soft, non-tender, gravid MS: Extremities nontender, no edema, normal ROM Neurologic: Alert and oriented x 4.  GU: no CVA tenderness Pelvic: NEFG, physiologic discharge, no blood, cervix clean.      Fetal Tracing: NST reactive Baseline: 120-125  Variability: moderate  Accelerations: present Decelerations: one variable noted at 1310 with spontaneous return to baseline Toco: irregular   Labs: Results for orders placed or performed during the hospital encounter of 06/13/24 (from the past 24 hours)  Rupture of Membrane (ROM) Plus     Status: None   Collection Time: 06/13/24 12:32 PM  Result Value Ref Range   Rom Plus POSITIVE     Imaging:  No results found.  MDM & MAU COURSE  MDM:  HIGH- Admit to LD for PROM  MAU Course: Orders Placed This Encounter  Procedures   Rupture of Membrane (ROM) Plus   CBC   RPR   Diet clear liquid Room service appropriate? Yes; Fluid consistency: Thin   Vitals signs per unit policy   Notify physician (specify)   Fetal monitoring per unit policy   Activity as tolerated   Cervical Exam    Measure blood pressure post delivery every 15 min x 1 hour then every 30 min x 1 hour   Fundal check post delivery every 15 min x 1 hour then every 30 min x 1 hour   Apply Labor & Delivery Care Plan   Patient may have epidural placement upon request   If Rapid HIV test positive or known HIV positive: initiate AZT orders   May in and out cath x 2 for inability to void   Insert urethral catheter X 1 PRN If Coude Catheter is chosen, qualified resources by campus can be found in the clinical skills nursing procedure for Coude Catheter 1. If straight catheterized > 2 times or patient unable to void post epidural plac...   Refer to Sidebar Report Urinary (Foley) Catheter Indications   Refer to Sidebar Report Post Indwelling Urinary Catheter Removal and Intervention Guidelines   Discontinue foley prior to vaginal delivery   Initiate Oral Care Protocol   Initiate Carrier Fluid Protocol   May use local infiltration of 1% lidocaine  plain to produce a skin wheal prior to IV insertion   Notify in-house Anesthesia team of nausea and vomiting greater than 5 hours   Assess for signs/symptoms of PIH/preeclampsia   RN to place order for: CBC if one has not been drawn in the past 6 hours for all patients with hypertensive disease, pre-eclampsia, eclampsia, thrombocytopenia or previous PLTC<150,000.   Identify to Anesthesia if patient plans to have postpartum tubal ligation; do not remove epidural without discussion with Anesthesiologist   Vital signs following Epidural Placement, re-bolus or re-dose monitor patient's BP and oxygen saturation every 5 minutes for 30 minutes   Pain Assessment Document numeric pain score   RN to remain at bedside continuously for 30 minutes post epidural placement, post re-bolus / re-dose   Do not administer IV opioids while epidural is in place.   Do not adjust epidural rate or discontinue epidural without notifying the anesthesiologist.   Notify Anesthesia if the patient  becomes short of breath or complains of heaviness in chest, chest pain, and/or unrelieved pain   Notify Anesthesia prior to discontinuing epidural infusion   Full code   Type and screen [facility]   Insert and maintain IV Line   Admit to Inpatient (patient's expected length of stay will be greater than 2 midnights or inpatient only procedure)   Meds ordered this encounter  Medications   lactated ringers  infusion   FOLLOWED BY Linked Order Group    oxytocin  (PITOCIN ) IV BOLUS FROM BAG    oxytocin  (PITOCIN ) IV infusion 30 units  in NS 500 mL - Premix   lactated ringers  infusion 500-1,000 mL   acetaminophen  (TYLENOL ) tablet 650 mg   oxyCODONE -acetaminophen  (PERCOCET/ROXICET) 5-325 MG per tablet 1 tablet   oxyCODONE -acetaminophen  (PERCOCET/ROXICET) 5-325 MG per tablet 2 tablet   fentaNYL  (SUBLIMAZE ) injection 50-100 mcg   ondansetron  (ZOFRAN ) injection 4 mg   sodium citrate-citric acid  (ORACIT) solution 30 mL   lidocaine  (PF) (XYLOCAINE ) 1 % injection 30 mL   ePHEDrine  injection 10 mg   PHENYLephrine  80 mcg/ml in normal saline Adult IV Push Syringe (For Blood Pressure Support)   lactated ringers  infusion 500 mL   fentaNYL  2 mcg/mL w/ bupivacaine  0.125% in NS 250 mL epidural infusion    Refill:  0   diphenhydrAMINE  (BENADRYL ) injection 12.5 mg   ePHEDrine  injection 10 mg   PHENYLephrine  80 mcg/ml in normal saline Adult IV Push Syringe (For Blood Pressure Support)     ASSESSMENT  PROM at [redacted]w[redacted]d  PLAN  Transfer to LD RN's aware and L/D Team aware Orders to follow ---------------------------------------------------- Olam Dalton, MSN, [facility], Center for [facility]    This chart was dictated using voice recognition software, Dragon. Despite the best efforts of this provider to proofread and correct errors, errors may still occur which can change documentation meaning.

## 2024-06-13 NOTE — Anesthesia Procedure Notes (Signed)
 Epidural Patient location during procedure: OB Start time: 06/13/2024 5:47 PM End time: 06/13/2024 6:00 PM  Staffing Anesthesiologist: Cleotilde Butler Dade, MD Performed: anesthesiologist   Preanesthetic Checklist Completed: patient identified, IV checked, site marked, risks and benefits discussed, surgical consent, monitors and equipment checked, pre-op evaluation and timeout performed  Epidural Patient position: sitting Prep: ChloraPrep Patient monitoring: heart rate, cardiac monitor, continuous pulse ox and blood pressure Approach: midline Location: L2-L3 Injection technique: LOR saline  Needle:  Needle type: Tuohy  Needle gauge: 17 G Needle length: 9 cm Needle insertion depth: 5 cm Catheter type: closed end flexible Catheter size: 20 Guage Catheter at skin depth: 9 cm Test dose: negative  Assessment Events: blood not aspirated, injection not painful, no injection resistance, no paresthesia and negative IV test  Additional Notes Reason for block:procedure for pain

## 2024-06-13 NOTE — Anesthesia Preprocedure Evaluation (Addendum)
 Anesthesia Evaluation  Patient identified by MRN, date of birth, ID band Patient awake    Reviewed: Allergy & Precautions, NPO status , Patient's Chart, lab work & pertinent test results  Airway Mallampati: II  TM Distance: >3 FB Neck ROM: Full    Dental no notable dental hx.    Pulmonary asthma    Pulmonary exam normal breath sounds clear to auscultation       Cardiovascular (-) hypertensionnegative cardio ROS Normal cardiovascular exam Rhythm:Regular Rate:Normal     Neuro/Psych negative neurological ROS  negative psych ROS   GI/Hepatic negative GI ROS, Neg liver ROS,,,  Endo/Other  negative endocrine ROS    Renal/GU negative Renal ROS  negative genitourinary   Musculoskeletal  (+) Arthritis , Rheumatoid disorders,    Abdominal   Peds negative pediatric ROS (+)  Hematology negative hematology ROS (+) Hb9.1, plt 195   Anesthesia Other Findings   Reproductive/Obstetrics (+) Pregnancy                              Anesthesia Physical Anesthesia Plan  ASA: 2  Anesthesia Plan: Epidural   Post-op Pain Management:    Induction:   PONV Risk Score and Plan: 2  Airway Management Planned: Natural Airway  Additional Equipment: None  Intra-op Plan:   Post-operative Plan:   Informed Consent: I have reviewed the patients History and Physical, chart, labs and discussed the procedure including the risks, benefits and alternatives for the proposed anesthesia with the patient or authorized representative who has indicated his/her understanding and acceptance.       Plan Discussed with:   Anesthesia Plan Comments:          Anesthesia Quick Evaluation

## 2024-06-13 NOTE — Discharge Summary (Signed)
 Postpartum Discharge Summary  Date of Service updated***     Patient Name: Caitlyn Green DOB: 2000/11/01 MRN: [id number]  Date of admission: 06/13/2024 Delivery date:06/13/2024 Delivering provider: NEWTON MERING Date of discharge: 06/13/2024  Admitting diagnosis: Normal labor [O80, Z37.9] Intrauterine pregnancy: [redacted]w[redacted]d     Secondary diagnosis:  Active Problems:   Normal labor   Vaginal delivery  Additional problems: ***    Discharge diagnosis: Term Pregnancy Delivered                                              Post partum procedures:{Postpartum procedures:23558} Augmentation: AROM and Pitocin  Complications: None  Hospital course: Induction of Labor With Vaginal Delivery   23 y.o. yo G3P2002 at [redacted]w[redacted]d was admitted to the hospital 06/13/2024 for induction of labor.  Indication for induction: PROM.  Patient had an labor course complicated by nothing Membrane Rupture Time/Date: 6:00 AM,06/13/2024  Delivery Method:Vaginal, Spontaneous Operative Delivery:N/A Episiotomy:   Lacerations:    Details of delivery can be found in separate delivery note.  Patient had a postpartum course complicated by***. Patient is discharged home 06/13/24.  Newborn Data: Birth date:06/13/2024 Birth time:11:18 PM Gender:Female Living status:Living Apgars:7 ,9  Weight:   Magnesium Sulfate received: No BMZ received: No Rhophylac:N/A MMR:N/A T-DaP:Given prenatally Flu: {Qol:76036} RSV Vaccine received: No Transfusion:{Transfusion received:30440034}  Immunizations received: Immunization History  Administered Date(s) Administered   Meningococcal Conjugate 04/06/2012   Tdap 02/18/2012, 09/28/2020, 09/17/2021, 04/29/2024    Physical exam  Vitals:   06/13/24 2000 06/13/24 2100 06/13/24 2230 06/13/24 2300  BP:  (!) 92/55 (!) 112/57 108/68  Pulse: 65 64 60 (!) 56  Resp:      Temp:      TempSrc:      SpO2:       General: {Exam; general:21111117} Lochia: {Desc;  appropriate/inappropriate:30686::appropriate} Uterine Fundus: {Desc; firm/soft:30687} Incision: {Exam; incision:21111123} DVT Evaluation: {Exam; icu:7888877} Labs: Lab Results  Component Value Date   WBC 4.5 06/13/2024   HGB 10.9 (L) 06/13/2024   HCT 35.4 (L) 06/13/2024   MCV 88.9 06/13/2024   PLT 215 06/13/2024      Latest Ref Rng & Units 01/25/2024   12:41 AM  CMP  Glucose 70 - 99 mg/dL 848   BUN 6 - 20 mg/dL 7   Creatinine 9.55 - 8.99 mg/dL 9.43   Sodium 864 - 854 mmol/L 133   Potassium 3.5 - 5.1 mmol/L 3.1   Chloride 98 - 111 mmol/L 102   CO2 22 - 32 mmol/L 21   Calcium 8.9 - 10.3 mg/dL 8.2    Edinburgh Score:    01/08/2022    2:02 PM  Edinburgh Postnatal Depression Scale Screening Tool  I have been able to laugh and see the funny side of things. 0   I have looked forward with enjoyment to things. 0   I have blamed myself unnecessarily when things went wrong. 1   I have been anxious or worried for no good reason. 1   I have felt scared or panicky for no good reason. 0   Things have been getting on top of me. 1   I have been so unhappy that I have had difficulty sleeping. 0   I have felt sad or miserable. 0   I have been so unhappy that I have been crying. 0   The thought of harming  myself has occurred to me. 0   Edinburgh Postnatal Depression Scale Total 3      Data saved with a previous flowsheet row definition   No data recorded  After visit meds:  Allergies as of 06/13/2024   No Known Allergies   Med Rec must be completed prior to using this [facility]***        Discharge home in stable condition Infant Feeding: {Baby feeding:23562} Infant Disposition:{CHL IP OB HOME WITH FNUYZM:76418} Discharge instruction: per After Visit Summary and Postpartum booklet. Activity: Advance as tolerated. Pelvic rest for 6 weeks.  Diet: {OB ipzu:78888878} Future Appointments: Future Appointments  Date Time Provider Department Center  06/20/2024  9:50 AM Kizzie Suzen SAUNDERS, CNM [facility]  06/27/2024  9:30 AM [facility] NURSE [facility]  06/27/2024  9:50 AM Cresenzo-Dishmon, Cathlean, CNM [facility]   Follow up Visit:   Please schedule this patient for a In person postpartum visit in 4 weeks with the following provider: Any provider. Additional Postpartum F/U:  Low risk pregnancy complicated by:  Delivery mode:  Vaginal, Spontaneous Anticipated Birth Control:  declines   06/13/2024 Cathlean Ely, CNM

## 2024-06-13 NOTE — Progress Notes (Signed)
 LOW-RISK PREGNANCY VISIT Patient name: Caitlyn Green MRN [id number]  Date of birth: [birth year] Chief Complaint:   Routine Prenatal Visit  History of Present Illness:   Caitlyn Green is a 23 y.o. G58P2002 female at [redacted]w[redacted]d with an Estimated Date of Delivery: 06/26/24 being seen today for ongoing management of a low-risk pregnancy.   Today she reports leaking fluid since 0600, lost mucous plug w/ some blood tinged mixed in. Irregular contractions. Contractions: Irregular. Vag. Bleeding: None.  Movement: Present.      05/20/2024   11:45 AM 05/18/2024   11:34 AM 05/16/2024    1:32 PM 04/11/2024   11:39 AM 12/22/2023   10:14 AM  Depression screen PHQ 2/9  Decreased Interest 0 0 0 0 1  Down, Depressed, Hopeless 0 0 0 0 0  PHQ - 2 Score 0 0 0 0 1  Altered sleeping  0  0 1  Tired, decreased energy  0  0 1  Change in appetite    0 1  Feeling bad or failure about yourself   0  0 0  Trouble concentrating  0  0 0  Moving slowly or fidgety/restless  0  0 0  Suicidal thoughts  0  0 0  PHQ-9 Score  0  0  4      Data saved with a previous flowsheet row definition        12/22/2023   10:15 AM 09/17/2021   10:22 AM 05/29/2021   11:22 AM 04/30/2020    4:19 PM  GAD 7 : Generalized Anxiety Score  Nervous, Anxious, on Edge 0 0 2 0  Control/stop worrying 0 0 2 0  Worry too much - different things 0 0 2 0  Trouble relaxing 0 0 2 0  Restless 0 0 1 0  Easily annoyed or irritable 1 2 3 2   Afraid - awful might happen 0 0 1 0  Total GAD 7 Score 1 2 13 2       Review of Systems:   Pertinent items are noted in HPI Denies abnormal vaginal discharge w/ itching/odor/irritation, headaches, visual changes, shortness of breath, chest pain, abdominal pain, severe nausea/vomiting, or problems with urination or bowel movements unless otherwise stated above. Pertinent History Reviewed:  Reviewed past medical,surgical, social, obstetrical and family history.  Reviewed problem list, medications and  allergies. Physical Assessment:   Vitals:   06/13/24 1000  BP: 113/77  Pulse: 76  Weight: 147 lb 3.2 oz (66.8 kg)  Body mass index is 20.53 kg/m.        Physical Examination:   General appearance: Well appearing, and in no distress  Mental status: Alert, oriented to person, place, and time  Skin: Warm & dry  Cardiovascular: Normal heart rate noted  Respiratory: Normal respiratory effort, no distress  Abdomen: Soft, gravid, nontender  Pelvic: SSE: no pooling, no change w/ valsalva, nitrazine & fern neg  Dilation: 3 Effacement (%): 70 Station: -3  Extremities:    Fetal Status: Fetal Heart Rate (bpm): 152 Fundal Height: 32 cm Movement: Present Presentation: Vertex  Chaperone: Alan Fischer No results found for this or any previous visit (from the past 24 hours).  Assessment & Plan:  1) Low-risk pregnancy G3P2002 at [redacted]w[redacted]d with an Estimated Date of Delivery: 06/26/24   2) Leaking fluid, no evidence of ROM here, pt reports this happened w/ previous pregnancy and amnisure at hospital was positive, prefers to go to [facility] for further eval to make sure. Dr. Eldonna notified  3) H/O FGR x 2 and S<D- EFW 35% @ 37w  4) JIA- no meds  5) Anemia> s/p IV Fe   Meds: No orders of the defined types were placed in this encounter.  Labs/procedures today: spec exam and SVE, fern, nitrazine  Plan:  to [facility] Next visit: prefers in person    Reviewed: Term labor symptoms and general obstetric precautions including but not limited to vaginal bleeding, contractions, leaking of fluid and fetal movement were reviewed in detail with the patient.  All questions were answered. Does have home bp cuff. Office bp cuff given: not applicable. Check bp weekly, let us  know if consistently >140 and/or >90.  Follow-up: No follow-ups on file.  Future Appointments  Date Time Provider Department Center  06/20/2024  9:50 AM Kizzie Suzen SAUNDERS, CNM [facility]  06/27/2024  9:30 AM [facility] NURSE [facility]   06/27/2024  9:50 AM Cresenzo-Dishmon, Cathlean, CNM [facility]    No orders of the defined types were placed in this encounter.  Suzen SAUNDERS Kizzie CNM, [facility] 06/13/2024 10:48 AM

## 2024-06-13 NOTE — Progress Notes (Signed)
 Patient Vitals for the past 4 hrs:  BP Temp Temp src Pulse Resp SpO2  06/13/24 2000 -- -- -- 65 -- --  06/13/24 1930 110/73 -- -- (!) 59 -- --  06/13/24 1901 119/67 -- -- (!) 57 -- --  06/13/24 1831 111/68 -- -- 70 -- --  06/13/24 1824 -- 98.3 F (36.8 C) Oral -- 16 --  06/13/24 1821 121/68 -- -- 96 -- --  06/13/24 1817 (!) 118/48 -- -- 88 -- --  06/13/24 1815 -- -- -- -- -- 100 %  06/13/24 1811 94/65 -- -- 83 -- 100 %  06/13/24 1806 97/60 -- -- (!) 113 -- --  06/13/24 1805 -- -- -- -- -- 100 %  06/13/24 1801 110/69 -- -- 78 -- --  06/13/24 1800 -- -- -- -- -- 99 %  06/13/24 1756 107/63 -- -- 76 -- --  06/13/24 1755 132/67 -- -- 85 -- 98 %   Comfortable w/epidural.  Pitocin  at 4 mu/min.  Ctx q 4 minutes.  FHR Cat 1. Cx 4/90/-2.  AROM of forebag w/large amount of fluid. Will titrate pitocin  up until ctx pattern improved.

## 2024-06-13 NOTE — H&P (Cosign Needed Addendum)
 OBSTETRIC ADMISSION HISTORY AND PHYSICAL  Caitlyn Green is a 23 y.o. female G3P2002 with IUP at [redacted]w[redacted]d by LMP presenting for SROM. She reports +FMs, No LOF, no VB, no blurry vision, headaches or peripheral edema, and RUQ pain.  She plans on breast pumping and bottle feeding breastmilk. She is undecided for birth control but considering pills. She received her prenatal care at [facility]   Dating: By LMP --->  Estimated Date of Delivery: 06/26/24  Sono:    @[redacted]w[redacted]d , CWD, normal anatomy, cephalic presentation, 2909 grams, 35 %    Prenatal History/Complications:  JIA Anemia  Past Medical History: Past Medical History:  Diagnosis Date   Back strain    Rheumatoid arthritis (HCC)     Past Surgical History: Past Surgical History:  Procedure Laterality Date   NO PAST SURGERIES      Obstetrical History: OB History     Gravida  3   Para  2   Term  2   Preterm  0   AB  0   Living  2      SAB  0   IAB  0   Ectopic  0   Multiple  0   Live Births  2           Social History Social History   Socioeconomic History   Marital status: Single    Spouse name: Not on file   Number of children: Not on file   Years of education: Not on file   Highest education level: Not on file  Occupational History   Occupation: [employer]   Tobacco Use   Smoking status: Never   Smokeless tobacco: Never  Vaping Use   Vaping status: Some Days  Substance and Sexual Activity   Alcohol use: Not Currently   Drug use: No   Sexual activity: Yes    Birth control/protection: None  Other Topics Concern   Not on file  Social History Narrative   Not on file   Social Drivers of Health   Financial Resource Strain: Low Risk  (12/22/2023)   Overall Financial Resource Strain (CARDIA)    Difficulty of Paying Living Expenses: Not hard at all  Food Insecurity: No Food Insecurity (06/13/2024)   Hunger Vital Sign    Worried About Running Out of Food in the Last Year: Never  true    Ran Out of Food in the Last Year: Never true  Transportation Needs: No Transportation Needs (06/13/2024)   PRAPARE - Administrator, Civil Service (Medical): No    Lack of Transportation (Non-Medical): No  Physical Activity: Insufficiently Active (12/22/2023)   Exercise Vital Sign    Days of Exercise per Week: 2 days    Minutes of Exercise per Session: 30 min  Stress: No Stress Concern Present (12/22/2023)   [employer] of Occupational Health - Occupational Stress Questionnaire    Feeling of Stress: Not at all  Social Connections: Moderately Integrated (12/22/2023)   Social Connection and Isolation Panel    Frequency of Communication with Friends and Family: More than three times a week    Frequency of Social Gatherings with Friends and Family: More than three times a week    Attends Religious Services: More than 4 times per year    Active Member of [employer] or Organizations: Yes    Attends Engineer, Structural: More than 4 times per year    Marital Status: Never married    Family History: Family  History  Problem Relation Age of Onset   Arthritis Maternal Grandmother    Cancer Father    Healthy Mother    Healthy Brother    Healthy Sister    Healthy Sister     Allergies: No Known Allergies  Facility-Administered Medications Prior to Admission  Medication Dose Route Frequency Provider Last Rate Last Admin   acetaminophen  (TYLENOL ) tablet 650 mg  650 mg Oral Once Booker, Kimberly R, CNM       diphenhydrAMINE  (BENADRYL ) capsule 25 mg  25 mg Oral Once Booker, Kimberly R, CNM       Medications Prior to Admission  Medication Sig Dispense Refill Last Dose/Taking   ferrous sulfate  325 (65 FE) MG tablet Take 1 tablet (325 mg total) by mouth every other day. 45 tablet 2 Past Week   Prenatal Vit-Fe Fumarate-FA (MULTIVITAMIN-PRENATAL) 27-0.8 MG TABS tablet Take 1 tablet by mouth daily at 12 noon.   06/12/2024   Blood Pressure Monitoring (BLOOD PRESSURE CUFF)  MISC 1 kit by Does not apply route as directed. 1 each 0    ondansetron  (ZOFRAN ) 4 MG tablet Take 1 tablet (4 mg total) by mouth every 8 (eight) hours as needed. 30 tablet 1      Review of Systems   All systems reviewed and negative except as stated in HPI  Blood pressure 107/65, pulse 70, temperature 98.1 F (36.7 C), temperature source Oral, resp. rate 16, last menstrual period 09/20/2023. General appearance: alert, cooperative, appears stated age, and no distress Lungs: clear to auscultation bilaterally Heart: regular rate and rhythm Abdomen: soft, non-tender; bowel sounds normal Extremities: Homans sign is negative, no sign of DVT Presentation: cephalic Fetal monitoringBaseline: 140 bpm, Variability: Good {> 6 bpm), and Accelerations: Reactive Uterine activityFrequency: Every 8-10 minutes   Per office check earlier today 12/8: Dilation: 3 Effacement (%): 70 Station: -3   Prenatal labs: ABO, Rh: --/--/PENDING (12/08 1351) Antibody: PENDING (12/08 1351) Rubella: 2.60 (06/17 1053) RPR: Non Reactive (10/06 1022)  HBsAg: Negative (06/17 1053)  HIV: Non Reactive (10/06 1022)  GBS: Negative/-- (12/01 1605)    Lab Results  Component Value Date   GBS Negative 06/06/2024   GTT nl Genetic screening  NIPS LR female Anatomy US  nl  Immunization History  Administered Date(s) Administered   Meningococcal Conjugate 04/06/2012   Tdap 02/18/2012, 09/28/2020, 09/17/2021, 04/29/2024    Prenatal Transfer Tool  Maternal Diabetes: No Genetic Screening: Normal Maternal Ultrasounds/Referrals: Normal Fetal Ultrasounds or other Referrals:  None Maternal Substance Abuse:  No Significant Maternal Medications:  None Significant Maternal Lab Results: Group B Strep negative Number of Prenatal Visits:greater than 3 verified prenatal visits Maternal Vaccinations:TDap Other Comments:  None   Results for orders placed or performed during the hospital encounter of 06/13/24 (from the past 24  hours)  Rupture of Membrane (ROM) Plus   Collection Time: 06/13/24 12:32 PM  Result Value Ref Range   Rom Plus POSITIVE   Type and screen [facility]   Collection Time: 06/13/24  1:51 PM  Result Value Ref Range   ABO/RH(D) PENDING    Antibody Screen PENDING    Sample Expiration      06/16/2024,2359 Performed at [facility], [address], [place], [place] [address]   CBC   Collection Time: 06/13/24  1:53 PM  Result Value Ref Range   WBC 4.5 4.0 - 10.5 K/uL   RBC 3.98 3.87 - 5.11 MIL/uL   Hemoglobin 10.9 (L) 12.0 - 15.0 g/dL  HCT 35.4 (L) 36.0 - 46.0 %   MCV 88.9 80.0 - 100.0 fL   MCH 27.4 26.0 - 34.0 pg   MCHC 30.8 30.0 - 36.0 g/dL   RDW 78.7 (H) 88.4 - 84.4 %   Platelets 215 150 - 400 K/uL   nRBC 0.0 0.0 - 0.2 %    Patient Active Problem List   Diagnosis Date Noted   Normal labor 06/13/2024   Anemia affecting pregnancy in third trimester 04/29/2024   Supervision of other normal pregnancy, antepartum 12/21/2023   Abnormal Pap smear of cervix 01/15/2022   Alpha thalassemia silent carrier 05/29/2021   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 G3P2002 at [redacted]w[redacted]d here for SOL (SROM ~0600)  #Labor: uncomplicated pregnancy presenting at term for SROM.  3cm dilated at [facility] office earlier today.  Contracting Q8-10 min; contractions are mild-moderate in intensity.  Plan expectant management for now with augmentation with pitocin  as indicated. #Pain: Planning epidural; wants to receive epidural prior to initiating pitocin , so will plan accordingly #FWB: Cat 2 tracing #GBS status:  negative #Feeding: Breastmilk  - pt desires to exclusively pump breast milk and not have baby latch to the breast #Reproductive Life planning: Undecided - thinking towards interval BTL but not at this time. Considering POPs in the meantime.   #Circ:  yes - discussed and consented for  procedure  Paralee JONELLE Carpen, MD  06/13/2024, 3:07 PM  Evaluation and management procedures were performed by the Family Medicine Resident/PA or Medical Student/Student Nurse Midwife under my supervision. I was immediately available for direct supervision, assistance and direction throughout this encounter.  I also confirm that I have verified the information documented in the resident's note, and that I have also personally reperformed the pertinent components of the physical exam and all of the medical decision making activities.  I have also made any necessary editorial changes.  Cathlean Ely, CNM [place] 06/13/2024 8:51 PM

## 2024-06-14 ENCOUNTER — Encounter (HOSPITAL_COMMUNITY): Payer: Self-pay | Admitting: Family Medicine

## 2024-06-14 LAB — SYPHILIS: RPR W/REFLEX TO RPR TITER AND TREPONEMAL ANTIBODIES, TRADITIONAL SCREENING AND DIAGNOSIS ALGORITHM: RPR Ser Ql: NONREACTIVE

## 2024-06-14 MED ORDER — DIPHENHYDRAMINE HCL 25 MG PO CAPS: 25.0000 mg | ORAL_CAPSULE | Freq: Four times a day (QID) | ORAL | Status: DC | PRN

## 2024-06-14 MED ORDER — BENZOCAINE-MENTHOL 20-0.5 % EX AERO: 1.0000 | INHALATION_SPRAY | CUTANEOUS | Status: DC | PRN

## 2024-06-14 MED ORDER — SIMETHICONE 80 MG PO CHEW: 80.0000 mg | CHEWABLE_TABLET | ORAL | Status: DC | PRN

## 2024-06-14 MED ORDER — ONDANSETRON HCL 4 MG PO TABS: 4.0000 mg | ORAL_TABLET | ORAL | Status: DC | PRN

## 2024-06-14 MED ORDER — BISACODYL 10 MG RE SUPP: 10.0000 mg | Freq: Every day | RECTAL | Status: DC | PRN

## 2024-06-14 MED ORDER — FERROUS SULFATE 325 (65 FE) MG PO TABS
325.0000 mg | ORAL_TABLET | ORAL | Status: DC
  Administered 2024-06-14: 325 mg via ORAL
  Filled 2024-06-14: qty 1

## 2024-06-14 MED ORDER — PRENATAL MULTIVITAMIN CH
1.0000 | ORAL_TABLET | Freq: Every day | ORAL | Status: DC
  Administered 2024-06-14: 1 via ORAL
  Filled 2024-06-14: qty 1

## 2024-06-14 MED ORDER — COCONUT OIL OIL: 1.0000 | TOPICAL_OIL | Status: DC | PRN

## 2024-06-14 MED ORDER — FLEET ENEMA RE ENEM: 1.0000 | ENEMA | Freq: Every day | RECTAL | Status: DC | PRN

## 2024-06-14 MED ORDER — MEDROXYPROGESTERONE ACETATE 150 MG/ML IM SUSP: 150.0000 mg | INTRAMUSCULAR | Status: DC | PRN

## 2024-06-14 MED ORDER — OXYCODONE HCL 5 MG PO TABS: 5.0000 mg | ORAL_TABLET | ORAL | Status: DC | PRN

## 2024-06-14 MED ORDER — IBUPROFEN 600 MG PO TABS
600.0000 mg | ORAL_TABLET | Freq: Four times a day (QID) | ORAL | Status: DC
  Administered 2024-06-14 – 2024-06-15 (×5): 600 mg via ORAL
  Filled 2024-06-14 (×5): qty 1

## 2024-06-14 MED ORDER — ONDANSETRON HCL 4 MG/2ML IJ SOLN: 4.0000 mg | INTRAMUSCULAR | Status: DC | PRN

## 2024-06-14 MED ORDER — ACETAMINOPHEN 325 MG PO TABS: 650.0000 mg | ORAL_TABLET | ORAL | Status: DC | PRN

## 2024-06-14 MED ORDER — METHYLERGONOVINE MALEATE 0.2 MG/ML IJ SOLN: 0.2000 mg | INTRAMUSCULAR | Status: DC | PRN

## 2024-06-14 MED ORDER — DIBUCAINE (PERIANAL) 1 % EX OINT: 1.0000 | TOPICAL_OINTMENT | CUTANEOUS | Status: DC | PRN

## 2024-06-14 MED ORDER — METHYLERGONOVINE MALEATE 0.2 MG PO TABS: 0.2000 mg | ORAL_TABLET | ORAL | Status: DC | PRN

## 2024-06-14 MED ORDER — WITCH HAZEL-GLYCERIN EX PADS: 1.0000 | MEDICATED_PAD | CUTANEOUS | Status: DC | PRN

## 2024-06-14 MED ORDER — TETANUS-DIPHTH-ACELL PERTUSSIS 5-2-15.5 LF-MCG/0.5 IM SUSP
0.5000 mL | Freq: Once | INTRAMUSCULAR | Status: DC
  Filled 2024-06-14: qty 0.5

## 2024-06-14 MED ORDER — SENNOSIDES-DOCUSATE SODIUM 8.6-50 MG PO TABS
2.0000 | ORAL_TABLET | ORAL | Status: DC
  Administered 2024-06-14: 2 via ORAL
  Filled 2024-06-14: qty 2

## 2024-06-14 MED ORDER — MEASLES, MUMPS & RUBELLA VAC ~~LOC~~ SUSR
0.5000 mL | Freq: Once | SUBCUTANEOUS | Status: DC
  Filled 2024-06-14: qty 0.5

## 2024-06-14 NOTE — Anesthesia Postprocedure Evaluation (Signed)
 Anesthesia Post Note  Patient: Caitlyn Green  Procedure(s) Performed: AN AD HOC LABOR EPIDURAL     Patient location during evaluation: Mother Baby Anesthesia Type: Epidural Level of consciousness: awake and alert and oriented Pain management: satisfactory to patient Vital Signs Assessment: post-procedure vital signs reviewed and stable Respiratory status: respiratory function stable Cardiovascular status: stable Postop Assessment: no headache, no backache, epidural receding, patient able to bend at knees, no signs of nausea or vomiting, adequate PO intake and able to ambulate Anesthetic complications: no   No notable events documented.  Last Vitals:  Vitals:   06/14/24 0245 06/14/24 0642  BP: (!) 102/55 105/64  Pulse: 77 64  Resp: 17 17  Temp: 36.5 C 36.6 C  SpO2: 99% 98%    Last Pain:  Vitals:   06/14/24 0642  TempSrc: Oral  PainSc: 0-No pain   Pain Goal:                   Keltin Baird

## 2024-06-14 NOTE — Lactation Note (Signed)
 This note was copied from a baby's chart. Lactation Consultation Note  Patient Name: Caitlyn Green Unijb'd Date: 06/14/2024 Age:23 hours  Mother had indicated upon admission that she did not need LC services.   Ms Moten is a P3 and she has declined Psychologist, Sport And Exercise and plans to formula feed her baby, according to her nurse. She does have a breast pump at home, if needed.   Mother is aware of service and can request to be seen at anytime.     Joshua Line M 06/14/2024, 8:20 AM

## 2024-06-14 NOTE — Plan of Care (Signed)

## 2024-06-14 NOTE — Progress Notes (Signed)
 Post Partum Day 1 Subjective: no complaints, up ad lib, voiding and tolerating PO, small lochia, plans to breastfeed, plans to bottle feed, no method  Objective: Blood pressure 105/64, pulse 64, temperature 97.9 F (36.6 C), temperature source Oral, resp. rate 17, last menstrual period 09/20/2023, SpO2 98%, unknown if currently breastfeeding.  Physical Exam:  General: alert, cooperative and no distress Lochia:normal flow Chest: CTAB Heart: RRR no m/r/g Abdomen: +BS, soft, nontender,  Uterine Fundus: firm DVT Evaluation: No evidence of DVT seen on physical exam. Extremities: no edema  Recent Labs    06/13/24 1353  HGB 10.9*  HCT 35.4*    Assessment/Plan: Plan for discharge tomorrow, Breastfeeding, Lactation consult, and Circumcision prior to discharge   LOS: 1 day   Cathlean Ely 06/14/2024, 7:08 AM

## 2024-06-15 ENCOUNTER — Other Ambulatory Visit (HOSPITAL_COMMUNITY): Payer: Self-pay

## 2024-06-15 MED ORDER — ACETAMINOPHEN 500 MG PO TABS
1000.0000 mg | ORAL_TABLET | Freq: Four times a day (QID) | ORAL | 2 refills | Status: AC | PRN
  Filled 2024-06-15: qty 100, 13d supply, fill #0

## 2024-06-15 MED ORDER — IBUPROFEN 600 MG PO TABS
600.0000 mg | ORAL_TABLET | Freq: Four times a day (QID) | ORAL | 0 refills | Status: AC
  Filled 2024-06-15: qty 30, 8d supply, fill #0

## 2024-06-15 NOTE — Discharge Instructions (Signed)

## 2024-06-15 NOTE — Plan of Care (Signed)

## 2024-06-15 NOTE — Plan of Care (Signed)
 Problem: Education: Goal: Knowledge of General Education information will improve Description: Including pain rating scale, medication(s)/side effects and non-pharmacologic comfort measures 06/15/2024 1020 by Madison Rosina LABOR, LPN Outcome: Adequate for Discharge 06/15/2024 0751 by Madison Rosina LABOR, LPN Outcome: Progressing   Problem: Health Behavior/Discharge Planning: Goal: Ability to manage health-related needs will improve 06/15/2024 1020 by Madison Rosina LABOR, LPN Outcome: Adequate for Discharge 06/15/2024 0751 by Madison Rosina LABOR, LPN Outcome: Progressing   Problem: Clinical Measurements: Goal: Ability to maintain clinical measurements within normal limits will improve 06/15/2024 1020 by Madison Rosina LABOR, LPN Outcome: Adequate for Discharge 06/15/2024 0751 by Madison Rosina LABOR, LPN Outcome: Progressing Goal: Will remain free from infection 06/15/2024 1020 by Madison Rosina LABOR, LPN Outcome: Adequate for Discharge 06/15/2024 0751 by Madison Rosina LABOR, LPN Outcome: Progressing Goal: Diagnostic test results will improve 06/15/2024 1020 by Madison Rosina LABOR, LPN Outcome: Adequate for Discharge 06/15/2024 0751 by Madison Rosina LABOR, LPN Outcome: Progressing Goal: Respiratory complications will improve 06/15/2024 1020 by Madison Rosina LABOR, LPN Outcome: Adequate for Discharge 06/15/2024 0751 by Madison Rosina LABOR, LPN Outcome: Progressing Goal: Cardiovascular complication will be avoided 06/15/2024 1020 by Madison Rosina LABOR, LPN Outcome: Adequate for Discharge 06/15/2024 0751 by Madison Rosina LABOR, LPN Outcome: Progressing   Problem: Activity: Goal: Risk for activity intolerance will decrease 06/15/2024 1020 by Madison Rosina LABOR, LPN Outcome: Adequate for Discharge 06/15/2024 0751 by Madison Rosina LABOR, LPN Outcome: Progressing   Problem: Nutrition: Goal: Adequate nutrition will be maintained 06/15/2024 1020 by Madison Rosina LABOR, LPN Outcome: Adequate for Discharge 06/15/2024 0751 by Madison Rosina LABOR, LPN Outcome:  Progressing   Problem: Coping: Goal: Level of anxiety will decrease 06/15/2024 1020 by Madison Rosina LABOR, LPN Outcome: Adequate for Discharge 06/15/2024 0751 by Madison Rosina LABOR, LPN Outcome: Progressing   Problem: Elimination: Goal: Will not experience complications related to bowel motility 06/15/2024 1020 by Madison Rosina LABOR, LPN Outcome: Adequate for Discharge 06/15/2024 0751 by Madison Rosina LABOR, LPN Outcome: Progressing Goal: Will not experience complications related to urinary retention 06/15/2024 1020 by Madison Rosina LABOR, LPN Outcome: Adequate for Discharge 06/15/2024 0751 by Madison Rosina LABOR, LPN Outcome: Progressing   Problem: Pain Managment: Goal: General experience of comfort will improve and/or be controlled 06/15/2024 1020 by Madison Rosina LABOR, LPN Outcome: Adequate for Discharge 06/15/2024 0751 by Madison Rosina LABOR, LPN Outcome: Progressing   Problem: Safety: Goal: Ability to remain free from injury will improve 06/15/2024 1020 by Madison Rosina LABOR, LPN Outcome: Adequate for Discharge 06/15/2024 0751 by Madison Rosina LABOR, LPN Outcome: Progressing   Problem: Skin Integrity: Goal: Risk for impaired skin integrity will decrease 06/15/2024 1020 by Madison Rosina LABOR, LPN Outcome: Adequate for Discharge 06/15/2024 0751 by Madison Rosina LABOR, LPN Outcome: Progressing   Problem: Education: Goal: Knowledge of Childbirth will improve 06/15/2024 1020 by Madison Rosina LABOR, LPN Outcome: Adequate for Discharge 06/15/2024 0751 by Madison Rosina LABOR, LPN Outcome: Progressing Goal: Ability to make informed decisions regarding treatment and plan of care will improve 06/15/2024 1020 by Madison Rosina LABOR, LPN Outcome: Adequate for Discharge 06/15/2024 0751 by Madison Rosina LABOR, LPN Outcome: Progressing Goal: Ability to state and carry out methods to decrease the pain will improve 06/15/2024 1020 by Madison Rosina LABOR, LPN Outcome: Adequate for Discharge 06/15/2024 0751 by Madison Rosina LABOR, LPN Outcome:  Progressing Goal: Individualized Educational Video(s) 06/15/2024 1020 by Madison Rosina LABOR, LPN Outcome: Adequate for Discharge 06/15/2024 0751 by Madison Rosina LABOR, LPN Outcome: Progressing   Problem: Coping: Goal: Ability to verbalize  concerns and feelings about labor and delivery will improve 06/15/2024 1020 by Madison Rosina LABOR, LPN Outcome: Adequate for Discharge 06/15/2024 0751 by Madison Rosina LABOR, LPN Outcome: Progressing   Problem: Life Cycle: Goal: Ability to make normal progression through stages of labor will improve 06/15/2024 1020 by Madison Rosina LABOR, LPN Outcome: Adequate for Discharge 06/15/2024 0751 by Madison Rosina LABOR, LPN Outcome: Progressing Goal: Ability to effectively push during vaginal delivery will improve 06/15/2024 1020 by Madison Rosina LABOR, LPN Outcome: Adequate for Discharge 06/15/2024 0751 by Madison Rosina LABOR, LPN Outcome: Progressing   Problem: Role Relationship: Goal: Will demonstrate positive interactions with the child 06/15/2024 1020 by Madison Rosina LABOR, LPN Outcome: Adequate for Discharge 06/15/2024 0751 by Madison Rosina LABOR, LPN Outcome: Progressing   Problem: Safety: Goal: Risk of complications during labor and delivery will decrease 06/15/2024 1020 by Madison Rosina LABOR, LPN Outcome: Adequate for Discharge 06/15/2024 0751 by Madison Rosina LABOR, LPN Outcome: Progressing   Problem: Pain Management: Goal: Relief or control of pain from uterine contractions will improve 06/15/2024 1020 by Madison Rosina LABOR, LPN Outcome: Adequate for Discharge 06/15/2024 0751 by Madison Rosina LABOR, LPN Outcome: Progressing   Problem: Education: Goal: Knowledge of condition will improve 06/15/2024 1020 by Madison Rosina LABOR, LPN Outcome: Adequate for Discharge 06/15/2024 0751 by Madison Rosina LABOR, LPN Outcome: Progressing Goal: Individualized Educational Video(s) 06/15/2024 1020 by Madison Rosina LABOR, LPN Outcome: Adequate for Discharge 06/15/2024 0751 by Madison Rosina LABOR, LPN Outcome:  Progressing Goal: Individualized Newborn Educational Video(s) 06/15/2024 1020 by Madison Rosina LABOR, LPN Outcome: Adequate for Discharge 06/15/2024 0751 by Madison Rosina LABOR, LPN Outcome: Progressing   Problem: Activity: Goal: Will verbalize the importance of balancing activity with adequate rest periods 06/15/2024 1020 by Madison Rosina LABOR, LPN Outcome: Adequate for Discharge 06/15/2024 0751 by Madison Rosina LABOR, LPN Outcome: Progressing Goal: Ability to tolerate increased activity will improve 06/15/2024 1020 by Madison Rosina LABOR, LPN Outcome: Adequate for Discharge 06/15/2024 0751 by Madison Rosina LABOR, LPN Outcome: Progressing   Problem: Coping: Goal: Ability to identify and utilize available resources and services will improve 06/15/2024 1020 by Madison Rosina LABOR, LPN Outcome: Adequate for Discharge 06/15/2024 0751 by Madison Rosina LABOR, LPN Outcome: Progressing   Problem: Life Cycle: Goal: Chance of risk for complications during the postpartum period will decrease 06/15/2024 1020 by Madison Rosina LABOR, LPN Outcome: Adequate for Discharge 06/15/2024 0751 by Madison Rosina LABOR, LPN Outcome: Progressing   Problem: Role Relationship: Goal: Ability to demonstrate positive interaction with newborn will improve 06/15/2024 1020 by Madison Rosina LABOR, LPN Outcome: Adequate for Discharge 06/15/2024 0751 by Madison Rosina LABOR, LPN Outcome: Progressing   Problem: Skin Integrity: Goal: Demonstration of wound healing without infection will improve 06/15/2024 1020 by Madison Rosina LABOR, LPN Outcome: Adequate for Discharge 06/15/2024 0751 by Madison Rosina LABOR, LPN Outcome: Progressing

## 2024-06-15 NOTE — Patient Instructions (Signed)
 If you are interested in an outpatient lactation consultation -- available in-office or virtually -- please reach out to us  at:  [facility] (First Floor) ?? [address], [place], [place]  ?? [phone] Please leave a message on our lactation voicemail box. We welcome any lactation-related questions or concerns -- our team is here to support you and your baby.  Lactation Support Groups Join us  at: [facility] ?? Tuesdays, 10:00 AM - 12:00 PM ?? [address], [place] [employer], [place]  Lactating parents and lap babies are welcome!  ?? [email or web address]  ?? [email or web address] -------------     Caitlyn Green, IBCLC [facility]

## 2024-06-16 LAB — BIRTH TISSUE RECOVERY COLLECTION (PLACENTA DONATION)

## 2024-06-20 ENCOUNTER — Encounter: Admitting: Women's Health

## 2024-06-21 ENCOUNTER — Telehealth (HOSPITAL_COMMUNITY): Payer: Self-pay

## 2024-06-21 NOTE — Telephone Encounter (Signed)
 06/21/2024 1458  Name: Caitlyn Green MRN: [id number] DOB: 18-Apr-2001  Reason for Call:  Transition of Care Hospital Discharge Call  Contact Status: Patient Contact Status: Complete  Language assistant needed:          Follow-Up Questions: Do You Have Any Concerns About Your Health As You Heal From Delivery?: No Do You Have Any Concerns About Your Infants Health?: No  Edinburgh Postnatal Depression Scale:  In the Past 7 Days: I have been able to laugh and see the funny side of things.: As much as I always could I have looked forward with enjoyment to things.: As much as I ever did I have blamed myself unnecessarily when things went wrong.: No, never I have been anxious or worried for no good reason.: No, not at all I have felt scared or panicky for no good reason.: No, not at all Things have been getting on top of me.: No, I have been coping as well as ever I have been so unhappy that I have had difficulty sleeping.: Not at all I have felt sad or miserable.: No, not at all I have been so unhappy that I have been crying.: No, never The thought of harming myself has occurred to me.: Never [place] Postnatal Depression Scale Total: 0  PHQ2-9 Depression Scale:     Discharge Follow-up: Edinburgh score requires follow up?: No Patient was advised of the following resources:: Breastfeeding Support Group, Support Group  Post-discharge interventions: Reviewed Newborn Safe Sleep Practices  Signature  Rosaline Deretha PEAK

## 2024-06-27 ENCOUNTER — Other Ambulatory Visit

## 2024-06-27 ENCOUNTER — Encounter: Admitting: Advanced Practice Midwife

## 2024-07-20 ENCOUNTER — Encounter: Payer: Self-pay | Admitting: Advanced Practice Midwife

## 2024-07-21 ENCOUNTER — Ambulatory Visit: Admitting: Advanced Practice Midwife

## 2024-07-25 ENCOUNTER — Encounter: Payer: Self-pay | Admitting: Advanced Practice Midwife

## 2024-07-25 ENCOUNTER — Ambulatory Visit: Admitting: Advanced Practice Midwife

## 2024-07-25 DIAGNOSIS — Z1332 Encounter for screening for maternal depression: Secondary | ICD-10-CM

## 2024-07-25 MED ORDER — SLYND 4 MG PO TABS: 1.0000 | ORAL_TABLET | Freq: Every day | ORAL | 11 refills | Status: AC

## 2024-07-25 NOTE — Progress Notes (Signed)
 Post Partum Visit Note   Chief Complaint:   Postpartum Care (Vaginal delivery. Want birth control pill)  History of Present Illness:   Caitlyn Green is a 24 y.o. [phone]  female being seen today for a postpartum visit. She is 5 weeks postpartum following a spontaneous vaginal delivery at 38.1 gestational weeks. IOL: No, . Anesthesia: epidural.  Laceration: none.  Complications: none. Inpatient contraception: no.   Pregnancy uncomplicated. Tobacco use: no. Substance use disorder: no. Last pap smear:     Component Value Date/Time   DIAGPAP (A) 12/22/2023 1031    - Atypical squamous cells of undetermined significance (ASC-US )   DIAGPAP - Low grade squamous intraepithelial lesion (LSIL) (A) 01/08/2022 1359   HPVHIGH Negative 12/22/2023 1031   HPVHIGH Positive (A) 01/08/2022 1359   ADEQPAP  12/22/2023 1031    Satisfactory for evaluation; transformation zone component PRESENT.   ADEQPAP  01/08/2022 1359    Satisfactory for evaluation; transformation zone component PRESENT.     Next pap smear due: 6/28 Patient's last menstrual period was 07/20/2024.  Postpartum course has been uncomplicated. Bleeding started period last Wednesday . Bowel function is normal. Bladder function is normal. Urinary incontinence? No, fecal incontinence? No Patient is sexually active. Last sexual activity: prior to birth.    Upstream - 07/25/24 1455       Pregnancy Intention Screening   Does the patient want to become pregnant in the next year? No    Does the patient's partner want to become pregnant in the next year? No    Would the patient like to discuss contraceptive options today? Yes      Contraception Wrap Up   Current Method Oral Contraceptive    End Method Oral Contraceptive    Contraception Counseling Provided Yes           The pregnancy intention screening data noted above was reviewed. Potential methods of contraception were discussed. The patient elected to proceed with Oral  Contraceptive. Because of her RA, she wants POPs  Edinburgh Postpartum Depression Screening: Negative  Edinburgh Postnatal Depression Scale - 07/25/24 1450       Edinburgh Postnatal Depression Scale:  In the Past 7 Days   I have been able to laugh and see the funny side of things. 0    I have looked forward with enjoyment to things. 0    I have blamed myself unnecessarily when things went wrong. 0    I have been anxious or worried for no good reason. 0    I have felt scared or panicky for no good reason. 0    Things have been getting on top of me. 0    I have been so unhappy that I have had difficulty sleeping. 0    I have felt sad or miserable. 0    I have been so unhappy that I have been crying. 0    The thought of harming myself has occurred to me. 0    Edinburgh Postnatal Depression Scale Total 0         Baby's course has been uncomplicated. Baby is feeding by bottle. Infant has a pediatrician/family doctor? Yes.  Childcare strategy if returning to work/school: yes.  Pt has material needs met for her and baby: Yes.   Review of Systems:   Pertinent items are noted in HPI Denies Abnormal vaginal discharge w/ itching/odor/irritation, headaches, visual changes, shortness of breath, chest pain, abdominal pain, severe nausea/vomiting, or problems with urination or bowel movements. Pertinent History  Reviewed:  Reviewed past medical,surgical, obstetrical and family history.  Reviewed problem list, medications and allergies. OB History  Gravida Para Term Preterm AB Living  3 3 3  0 0 3  SAB IAB Ectopic Multiple Live Births  0 0 0 0 3    # Outcome Date GA Lbr Len/2nd Weight Sex Type Anes PTL Lv  3 Term 06/13/24 [redacted]w[redacted]d 17:10 / 00:08 7 lb 2.3 oz (3.24 kg) M Vag-Spont EPI  LIV  2 Term 11/28/21 [redacted]w[redacted]d 19:25 / 00:11 5 lb 15.2 oz (2.7 kg) F Vag-Spont EPI  LIV     Birth Comments: wnl  1 Term 10/25/20 [redacted]w[redacted]d  5 lb 9 oz (2.523 kg) M Vag-Spont EPI N LIV     Complications: IUGR (intrauterine  growth restriction) affecting care of mother   Physical Assessment:   Vitals:   07/25/24 1440  BP: 102/68  Pulse: 74  Weight: 131 lb (59.4 kg)  Height: 5' 11 (1.803 m)  Body mass index is 18.27 kg/m.  Objective:  Blood pressure 102/68, pulse 74, height 5' 11 (1.803 m), weight 131 lb (59.4 kg), last menstrual period 07/20/2024, not currently breastfeeding.  General:  alert, cooperative, and no distress   Breasts:  negative  Lungs: Normal respiratory effort  Heart:  regular rate and rhythm  Abdomen: soft, non-tender,    Vulva:  normal  Vagina: normal vagina  Cervix:  normal  Corpus: Well involuted  Adnexa:  not evaluated  Rectal Exam: no hemorrhoids          No results found for this or any previous visit (from the past 24 hours).  Assessment & Plan:  1) Postpartum exam 2) 5 wks s/p spontaneous vaginal delivery 3) bottle feeding 4) Depression screening 5) Contraception management: .meds  Essential components of care per ACOG recommendations:  1.  Mood and well being:  If positive depression screen, discussed and plan developed.  If using tobacco we discussed reduction/cessation and risk of relapse If current substance abuse, we discussed and referral to local resources was offered.   2. Infant care and feeding:  If breastfeeding, discussed returning to work, pumping, breastfeeding-associated pain, guidance regarding return to fertility while lactating if not using another method. If needed, patient was provided with a letter to be allowed to pump q 2-3hrs to support lactation in a private location with access to a refrigerator to store breastmilk.   Recommended that all caregivers be immunized for flu, pertussis and other preventable communicable diseases If pt does not have material needs met for her/baby, referred to local resources for help obtaining these.  3. Sexuality, contraception and birth spacing Provided guidance regarding sexuality, management of  dyspareunia, and resumption of intercourse Discussed avoiding interpregnancy interval <29mths and recommended birth spacing of 18 months  4. Sleep and fatigue Discussed coping options for fatigue and sleep disruption Encouraged family/partner/community support of 4 hrs of uninterrupted sleep to help with mood and fatigue  5. Physical recovery  If pt had a C/S, assessed incisional pain and providing guidance on normal vs prolonged recovery If pt had a laceration, perineal healing and pain reviewed.  If urinary or fecal incontinence, discussed management and referred to PT or uro/gyn if indicated  Patient is safe to resume physical activity. Discussed attainment of healthy weight.  6.  Chronic disease management Discussed pregnancy complications if any, and their implications for future childbearing and long-term maternal health. Review recommendations for prevention of recurrent pregnancy complications, such as aspirin  to reduce risk of preeclampsia not  applicable. Pt had GDM: No. If yes, 2hr GTT scheduled: not applicable. Reviewed medications and non-pregnant dosing including consideration of whether pt is breastfeeding using a reliable resource such as [employer]act[employer]: not applicable Referred for f/u w/ PCP or subspecialist providers as indicated: not applicable  7. Health maintenance Mammogram at 24yo or earlier if indicated Pap smears as indicated  Meds:  Meds ordered this encounter  Medications   Drospirenone  (SLYND ) 4 MG TABS    Sig: Take 1 tablet (4 mg total) by mouth daily. Start on the Sunday after the baby turns 35 weeks old    Dispense:  28 tablet    Refill:  11    Supervising Provider:   JAYNE MINDER H [[id number]]    Follow-up: No follow-ups on file.   No orders of the defined types were placed in this encounter.      Cathlean Ely DNP, CNM [facility] [employer], [facility] 07/25/2024 4:03 PM
